# Patient Record
Sex: Female | Born: 1955 | Race: White | Hispanic: No | Marital: Married | Smoking: Former smoker
Health system: Southern US, Community
[De-identification: ages and names within clinical notes are randomized; demographics above are authoritative.]

## PROBLEM LIST (undated history)

## (undated) DIAGNOSIS — B019 Varicella without complication: Secondary | ICD-10-CM

## (undated) DIAGNOSIS — Z860100 Personal history of colon polyps, unspecified: Secondary | ICD-10-CM

## (undated) DIAGNOSIS — R7303 Prediabetes: Secondary | ICD-10-CM

## (undated) DIAGNOSIS — T7840XA Allergy, unspecified, initial encounter: Secondary | ICD-10-CM

## (undated) DIAGNOSIS — K52831 Collagenous colitis: Secondary | ICD-10-CM

## (undated) DIAGNOSIS — I10 Essential (primary) hypertension: Secondary | ICD-10-CM

## (undated) DIAGNOSIS — M5030 Other cervical disc degeneration, unspecified cervical region: Secondary | ICD-10-CM

## (undated) DIAGNOSIS — U071 COVID-19: Secondary | ICD-10-CM

## (undated) DIAGNOSIS — Z87898 Personal history of other specified conditions: Secondary | ICD-10-CM

## (undated) DIAGNOSIS — F329 Major depressive disorder, single episode, unspecified: Secondary | ICD-10-CM

## (undated) DIAGNOSIS — L900 Lichen sclerosus et atrophicus: Secondary | ICD-10-CM

## (undated) DIAGNOSIS — K760 Fatty (change of) liver, not elsewhere classified: Secondary | ICD-10-CM

## (undated) DIAGNOSIS — K635 Polyp of colon: Secondary | ICD-10-CM

## (undated) DIAGNOSIS — C4492 Squamous cell carcinoma of skin, unspecified: Secondary | ICD-10-CM

## (undated) DIAGNOSIS — E559 Vitamin D deficiency, unspecified: Secondary | ICD-10-CM

## (undated) DIAGNOSIS — F32A Depression, unspecified: Secondary | ICD-10-CM

## (undated) DIAGNOSIS — E785 Hyperlipidemia, unspecified: Secondary | ICD-10-CM

## (undated) DIAGNOSIS — F419 Anxiety disorder, unspecified: Secondary | ICD-10-CM

## (undated) DIAGNOSIS — M436 Torticollis: Secondary | ICD-10-CM

## (undated) DIAGNOSIS — D751 Secondary polycythemia: Secondary | ICD-10-CM

## (undated) DIAGNOSIS — M67432 Ganglion, left wrist: Secondary | ICD-10-CM

## (undated) DIAGNOSIS — R42 Dizziness and giddiness: Secondary | ICD-10-CM

## (undated) DIAGNOSIS — R32 Unspecified urinary incontinence: Secondary | ICD-10-CM

## (undated) DIAGNOSIS — K589 Irritable bowel syndrome without diarrhea: Secondary | ICD-10-CM

## (undated) DIAGNOSIS — E119 Type 2 diabetes mellitus without complications: Secondary | ICD-10-CM

## (undated) DIAGNOSIS — K649 Unspecified hemorrhoids: Secondary | ICD-10-CM

## (undated) HISTORY — DX: Other cervical disc degeneration, unspecified cervical region: M50.30

## (undated) HISTORY — DX: Essential (primary) hypertension: I10

## (undated) HISTORY — DX: Varicella without complication: B01.9

## (undated) HISTORY — DX: Fatty (change of) liver, not elsewhere classified: K76.0

## (undated) HISTORY — DX: Ganglion, left wrist: M67.432

## (undated) HISTORY — DX: Vitamin D deficiency, unspecified: E55.9

## (undated) HISTORY — DX: COVID-19: U07.1

## (undated) HISTORY — PX: HYSTEROSALPINGOGRAM: SHX6581

## (undated) HISTORY — DX: Prediabetes: R73.03

## (undated) HISTORY — DX: Polyp of colon: K63.5

## (undated) HISTORY — DX: Personal history of other specified conditions: Z87.898

## (undated) HISTORY — DX: Lichen sclerosus et atrophicus: L90.0

## (undated) HISTORY — DX: Collagenous colitis: K52.831

## (undated) HISTORY — DX: Unspecified urinary incontinence: R32

## (undated) HISTORY — PX: TONSILLECTOMY AND ADENOIDECTOMY: SHX28

## (undated) HISTORY — DX: Secondary polycythemia: D75.1

## (undated) HISTORY — DX: Major depressive disorder, single episode, unspecified: F32.9

## (undated) HISTORY — DX: Personal history of colonic polyps: Z86.010

## (undated) HISTORY — DX: Irritable bowel syndrome without diarrhea: K58.9

## (undated) HISTORY — DX: Allergy, unspecified, initial encounter: T78.40XA

## (undated) HISTORY — DX: Unspecified hemorrhoids: K64.9

## (undated) HISTORY — DX: Squamous cell carcinoma of skin, unspecified: C44.92

## (undated) HISTORY — DX: Anxiety disorder, unspecified: F41.9

## (undated) HISTORY — DX: Hyperlipidemia, unspecified: E78.5

## (undated) HISTORY — DX: Depression, unspecified: F32.A

## (undated) HISTORY — DX: Personal history of colon polyps, unspecified: Z86.0100

## (undated) HISTORY — PX: EYE SURGERY: SHX253

---

## 2004-10-16 HISTORY — PX: COLONOSCOPY: SHX174

## 2006-07-10 ENCOUNTER — Ambulatory Visit: Payer: Self-pay | Admitting: Gastroenterology

## 2009-06-09 ENCOUNTER — Ambulatory Visit: Payer: Self-pay | Admitting: Family Medicine

## 2010-05-31 ENCOUNTER — Ambulatory Visit: Payer: Self-pay | Admitting: Internal Medicine

## 2010-06-14 ENCOUNTER — Ambulatory Visit: Payer: Self-pay | Admitting: Internal Medicine

## 2011-05-30 ENCOUNTER — Ambulatory Visit: Payer: Self-pay | Admitting: Internal Medicine

## 2011-06-07 ENCOUNTER — Ambulatory Visit: Payer: Self-pay | Admitting: Internal Medicine

## 2011-06-17 ENCOUNTER — Ambulatory Visit: Payer: Self-pay | Admitting: Internal Medicine

## 2011-07-17 ENCOUNTER — Ambulatory Visit: Payer: Self-pay | Admitting: Internal Medicine

## 2011-08-17 ENCOUNTER — Ambulatory Visit: Payer: Self-pay | Admitting: Internal Medicine

## 2011-09-16 ENCOUNTER — Ambulatory Visit: Payer: Self-pay | Admitting: Internal Medicine

## 2011-10-17 ENCOUNTER — Ambulatory Visit: Payer: Self-pay | Admitting: Internal Medicine

## 2011-10-18 LAB — CANCER CENTER HEMATOCRIT: HCT: 46.4 % (ref 35.0–47.0)

## 2011-11-17 ENCOUNTER — Ambulatory Visit: Payer: Self-pay | Admitting: Internal Medicine

## 2012-01-24 ENCOUNTER — Ambulatory Visit: Payer: Self-pay | Admitting: Internal Medicine

## 2012-01-24 LAB — HEMATOCRIT: HCT: 46.3 %

## 2012-02-14 ENCOUNTER — Ambulatory Visit: Payer: Self-pay | Admitting: Internal Medicine

## 2012-03-26 ENCOUNTER — Ambulatory Visit: Payer: Self-pay | Admitting: Internal Medicine

## 2012-03-26 LAB — HEMATOCRIT: HCT: 47.3 % — ABNORMAL HIGH (ref 35.0–47.0)

## 2012-04-15 ENCOUNTER — Ambulatory Visit: Payer: Self-pay | Admitting: Internal Medicine

## 2012-06-11 ENCOUNTER — Ambulatory Visit: Payer: Self-pay | Admitting: Internal Medicine

## 2012-07-04 ENCOUNTER — Ambulatory Visit: Payer: Self-pay | Admitting: Internal Medicine

## 2012-07-16 ENCOUNTER — Ambulatory Visit: Payer: Self-pay | Admitting: Internal Medicine

## 2012-08-20 ENCOUNTER — Ambulatory Visit: Payer: Self-pay | Admitting: Internal Medicine

## 2012-08-20 LAB — CANCER CENTER HEMATOCRIT: HCT: 48.2 % — ABNORMAL HIGH (ref 35.0–47.0)

## 2012-09-15 ENCOUNTER — Ambulatory Visit: Payer: Self-pay | Admitting: Internal Medicine

## 2012-10-17 ENCOUNTER — Ambulatory Visit: Payer: Self-pay | Admitting: Internal Medicine

## 2012-10-17 LAB — CBC CANCER CENTER
Eosinophil #: 0.1 x10 3/mm (ref 0.0–0.7)
Eosinophil %: 0.8 %
HCT: 44.7 % (ref 35.0–47.0)
HGB: 15.1 g/dL (ref 12.0–16.0)
Lymphocyte #: 1.5 x10 3/mm (ref 1.0–3.6)
Lymphocyte %: 16 %
MCH: 31.8 pg (ref 26.0–34.0)
MCHC: 33.7 g/dL (ref 32.0–36.0)
Monocyte %: 5.9 %
Neutrophil #: 7 x10 3/mm — ABNORMAL HIGH (ref 1.4–6.5)
RDW: 13.5 % (ref 11.5–14.5)

## 2012-11-16 ENCOUNTER — Ambulatory Visit: Payer: Self-pay | Admitting: Internal Medicine

## 2012-12-17 ENCOUNTER — Ambulatory Visit: Payer: Self-pay | Admitting: Internal Medicine

## 2013-01-14 ENCOUNTER — Ambulatory Visit: Payer: Self-pay | Admitting: Internal Medicine

## 2013-02-13 ENCOUNTER — Ambulatory Visit: Payer: Self-pay | Admitting: Internal Medicine

## 2013-04-08 ENCOUNTER — Ambulatory Visit: Payer: Self-pay | Admitting: Internal Medicine

## 2013-04-15 ENCOUNTER — Ambulatory Visit: Payer: Self-pay | Admitting: Internal Medicine

## 2013-06-12 ENCOUNTER — Ambulatory Visit: Payer: Self-pay | Admitting: Internal Medicine

## 2013-07-29 ENCOUNTER — Ambulatory Visit: Payer: Self-pay | Admitting: Internal Medicine

## 2013-07-29 LAB — CANCER CENTER HEMATOCRIT: HCT: 49.9 % — ABNORMAL HIGH (ref 35.0–47.0)

## 2013-08-16 ENCOUNTER — Ambulatory Visit: Payer: Self-pay | Admitting: Internal Medicine

## 2013-09-25 ENCOUNTER — Ambulatory Visit: Payer: Self-pay | Admitting: Internal Medicine

## 2013-09-25 LAB — CBC CANCER CENTER
Basophil #: 0 x10 3/mm (ref 0.0–0.1)
Basophil %: 0.6 %
Eosinophil #: 0.1 x10 3/mm (ref 0.0–0.7)
Eosinophil %: 0.6 %
HCT: 48.7 % — ABNORMAL HIGH (ref 35.0–47.0)
Lymphocyte %: 13.7 %
MCHC: 33.6 g/dL (ref 32.0–36.0)
Monocyte #: 0.5 x10 3/mm (ref 0.2–0.9)
Platelet: 280 x10 3/mm (ref 150–440)
RDW: 13.5 % (ref 11.5–14.5)

## 2013-10-16 ENCOUNTER — Ambulatory Visit: Payer: Self-pay | Admitting: Internal Medicine

## 2013-10-21 LAB — CANCER CENTER HEMATOCRIT: HCT: 44.1 % (ref 35.0–47.0)

## 2013-11-04 LAB — CANCER CENTER HEMATOCRIT: HCT: 43.9 % (ref 35.0–47.0)

## 2013-11-16 ENCOUNTER — Ambulatory Visit: Payer: Self-pay | Admitting: Internal Medicine

## 2013-11-18 LAB — CANCER CENTER HEMATOCRIT: HCT: 45.8 % (ref 35.0–47.0)

## 2013-12-01 LAB — CANCER CENTER HEMATOCRIT: HCT: 41.6 % (ref 35.0–47.0)

## 2013-12-14 ENCOUNTER — Ambulatory Visit: Payer: Self-pay | Admitting: Internal Medicine

## 2013-12-30 LAB — HEMATOCRIT: HCT: 41.3 % (ref 35.0–47.0)

## 2014-01-14 ENCOUNTER — Ambulatory Visit: Payer: Self-pay | Admitting: Internal Medicine

## 2014-01-27 LAB — CANCER CENTER HEMATOCRIT: HCT: 43.5 % (ref 35.0–47.0)

## 2014-02-10 ENCOUNTER — Ambulatory Visit: Payer: Self-pay | Admitting: Internal Medicine

## 2014-02-10 LAB — CANCER CENTER HEMATOCRIT: HCT: 45.9 % (ref 35.0–47.0)

## 2014-02-13 ENCOUNTER — Ambulatory Visit: Payer: Self-pay | Admitting: Internal Medicine

## 2014-02-26 ENCOUNTER — Ambulatory Visit: Payer: Self-pay | Admitting: Internal Medicine

## 2014-02-26 LAB — CBC CANCER CENTER
BASOS ABS: 0.1 x10 3/mm (ref 0.0–0.1)
Basophil %: 1.2 %
EOS PCT: 1.3 %
Eosinophil #: 0.1 x10 3/mm (ref 0.0–0.7)
HCT: 41.9 % (ref 35.0–47.0)
HGB: 13.8 g/dL (ref 12.0–16.0)
LYMPHS PCT: 24.4 %
Lymphocyte #: 1.9 x10 3/mm (ref 1.0–3.6)
MCH: 31.3 pg (ref 26.0–34.0)
MCHC: 33 g/dL (ref 32.0–36.0)
MCV: 95 fL (ref 80–100)
MONOS PCT: 6.4 %
Monocyte #: 0.5 x10 3/mm (ref 0.2–0.9)
NEUTROS PCT: 66.7 %
Neutrophil #: 5.2 x10 3/mm (ref 1.4–6.5)
PLATELETS: 335 x10 3/mm (ref 150–440)
RBC: 4.42 10*6/uL (ref 3.80–5.20)
RDW: 14.3 % (ref 11.5–14.5)
WBC: 7.8 x10 3/mm (ref 3.6–11.0)

## 2014-03-16 ENCOUNTER — Ambulatory Visit: Payer: Self-pay | Admitting: Internal Medicine

## 2014-05-19 ENCOUNTER — Ambulatory Visit: Payer: Self-pay | Admitting: Internal Medicine

## 2014-05-19 LAB — HEMATOCRIT: HCT: 41.7 % (ref 35.0–47.0)

## 2014-06-16 ENCOUNTER — Ambulatory Visit: Payer: Self-pay | Admitting: Internal Medicine

## 2014-07-08 ENCOUNTER — Ambulatory Visit: Payer: Self-pay | Admitting: Internal Medicine

## 2014-08-11 ENCOUNTER — Ambulatory Visit: Payer: Self-pay | Admitting: Internal Medicine

## 2014-08-11 LAB — HEMATOCRIT: HCT: 42 % (ref 35.0–47.0)

## 2014-08-16 ENCOUNTER — Ambulatory Visit: Payer: Self-pay | Admitting: Internal Medicine

## 2014-11-03 ENCOUNTER — Ambulatory Visit: Payer: Self-pay | Admitting: Internal Medicine

## 2014-11-03 LAB — HEMATOCRIT: HCT: 42.2 % (ref 35.0–47.0)

## 2014-11-16 ENCOUNTER — Ambulatory Visit: Payer: Self-pay | Admitting: Internal Medicine

## 2015-01-12 ENCOUNTER — Ambulatory Visit: Payer: Self-pay | Admitting: Internal Medicine

## 2015-06-01 ENCOUNTER — Other Ambulatory Visit: Payer: Self-pay | Admitting: Internal Medicine

## 2015-06-01 DIAGNOSIS — Z1231 Encounter for screening mammogram for malignant neoplasm of breast: Secondary | ICD-10-CM

## 2015-06-08 ENCOUNTER — Ambulatory Visit
Admission: RE | Admit: 2015-06-08 | Discharge: 2015-06-08 | Disposition: A | Discharge status: Home / Self Care | Payer: BLUE CROSS/BLUE SHIELD | Source: Ambulatory Visit | Attending: Internal Medicine | Admitting: Internal Medicine

## 2015-06-08 DIAGNOSIS — Z1231 Encounter for screening mammogram for malignant neoplasm of breast: Secondary | ICD-10-CM

## 2015-07-06 DIAGNOSIS — D751 Secondary polycythemia: Secondary | ICD-10-CM

## 2015-07-06 DIAGNOSIS — E559 Vitamin D deficiency, unspecified: Secondary | ICD-10-CM

## 2015-07-06 DIAGNOSIS — K589 Irritable bowel syndrome without diarrhea: Secondary | ICD-10-CM

## 2015-07-06 DIAGNOSIS — E1169 Type 2 diabetes mellitus with other specified complication: Secondary | ICD-10-CM | POA: Insufficient documentation

## 2015-07-06 DIAGNOSIS — E785 Hyperlipidemia, unspecified: Secondary | ICD-10-CM

## 2015-07-06 HISTORY — DX: Hyperlipidemia, unspecified: E78.5

## 2015-07-06 HISTORY — DX: Irritable bowel syndrome, unspecified: K58.9

## 2015-07-06 HISTORY — DX: Vitamin D deficiency, unspecified: E55.9

## 2015-07-06 HISTORY — DX: Secondary polycythemia: D75.1

## 2015-07-08 ENCOUNTER — Ambulatory Visit: Payer: Self-pay | Admitting: Gastroenterology

## 2015-07-08 ENCOUNTER — Ambulatory Visit (INDEPENDENT_AMBULATORY_CARE_PROVIDER_SITE_OTHER): Payer: BLUE CROSS/BLUE SHIELD | Admitting: Gastroenterology

## 2015-07-08 ENCOUNTER — Encounter: Payer: Self-pay | Admitting: Gastroenterology

## 2015-07-08 ENCOUNTER — Encounter (INDEPENDENT_AMBULATORY_CARE_PROVIDER_SITE_OTHER): Payer: Self-pay

## 2015-07-08 DIAGNOSIS — K589 Irritable bowel syndrome without diarrhea: Secondary | ICD-10-CM | POA: Diagnosis not present

## 2015-07-08 NOTE — Progress Notes (Signed)
Gastroenterology Consultation  Referring Provider:     No ref. provider found Primary Care Physician:  Albina Billet, MD Primary Gastroenterologist:  Dr. Allen Norris     Reason for Consultation:     IBS with diarrhea        HPI:   Vanessa Herman is a 59 y.o. y/o female referred for consultation & management of IBS with diarrhea by Dr. Albina Billet, MD.  This patient comes in today with a report of IBS with diarrhea for many years. She states she has been dealing with this for some time. The patient reports that she has to wear diapers to bed because she has accidents. She is also afraid to go outside because she has so much diarrhea that she will soiled herself. No report of any unexplained weight loss. The patient also reports that she has had a colonoscopy since last seeing meeting years ago. He is no report of any unexplained weight loss. She does report that she thinks milk products and there products may make her symptoms somewhat worse.  Past Medical History  Diagnosis Date  . IBS (irritable bowel syndrome) 07/06/2015  . Hyperlipidemia 07/06/2015  . Polycythemia, secondary 07/06/2015  . Vitamin D deficiency 07/06/2015    Past Surgical History  Procedure Laterality Date  . Colonoscopy  2006    Prior to Admission medications   Medication Sig Start Date End Date Taking? Authorizing Provider  aspirin 81 MG tablet Take 81 mg by mouth daily.   Yes Historical Provider, MD  cholecalciferol (VITAMIN D) 1000 UNITS tablet Take 1,000 Units by mouth 2 (two) times daily.   Yes Historical Provider, MD  diazepam (VALIUM) 5 MG tablet Take 5 mg by mouth every 6 (six) hours as needed for anxiety.   Yes Historical Provider, MD  Multiple Vitamin (MULTIVITAMIN) tablet Take 1 tablet by mouth daily.   Yes Historical Provider, MD  simvastatin (ZOCOR) 20 MG tablet Take 20 mg by mouth daily.   Yes Historical Provider, MD    Family History  Problem Relation Age of Onset  . Breast cancer Maternal Grandmother 60   . Hypertension Father   . Hyperlipidemia Father      Social History  Substance Use Topics  . Smoking status: Former Research scientist (life sciences)  . Smokeless tobacco: Never Used  . Alcohol Use: 0.0 oz/week    0 Standard drinks or equivalent per week    Allergies as of 07/08/2015 - Review Complete 07/08/2015  Allergen Reaction Noted  . Lipitor [atorvastatin] Swelling 06/08/2015  . Levaquin [levofloxacin in d5w] Nausea And Vomiting 07/06/2015    Review of Systems:    All systems reviewed and negative except where noted in HPI.   Physical Exam:  There were no vitals taken for this visit. No LMP recorded. Patient is postmenopausal. Psych:  Alert and cooperative. Normal mood and affect. General:   Alert,  Well-developed, well-nourished, pleasant and cooperative in NAD Head:  Normocephalic and atraumatic. Eyes:  Sclera clear, no icterus.   Conjunctiva pink. Ears:  Normal auditory acuity. Nose:  No deformity, discharge, or lesions. Mouth:  No deformity or lesions,oropharynx pink & moist. Neck:  Supple; no masses or thyromegaly. Lungs:  Respirations even and unlabored.  Clear throughout to auscultation.   No wheezes, crackles, or rhonchi. No acute distress. Heart:  Regular rate and rhythm; no murmurs, clicks, rubs, or gallops. Abdomen:  Normal bowel sounds.  No bruits.  Soft, non-tender and non-distended without masses, hepatosplenomegaly or hernias noted.  No guarding or  rebound tenderness.  Negative Carnett sign.   Rectal:  Deferred.  Msk:  Symmetrical without gross deformities.  Good, equal movement & strength bilaterally. Pulses:  Normal pulses noted. Extremities:  No clubbing or edema.  No cyanosis. Neurologic:  Alert and oriented x3;  grossly normal neurologically. Skin:  Intact without significant lesions or rashes.  No jaundice. Lymph Nodes:  No significant cervical adenopathy. Psych:  Alert and cooperative. Normal mood and affect.  Imaging Studies: No results found.  Assessment and Plan:     Vanessa Herman is a 59 y.o. y/o female who comes in today with a history of irritable bowel syndrome with diarrhea predominance. The patient states that she had constipation as a child but then started having diarrhea for the last 20 years. She will now be started on Viberzi samples to see if this helps her symptoms if not we will consider Imodium. The patient will also be set up for colonoscopy since she has not had a colonoscopy in 10 years. The patient has been explained the plan and agrees with it I have discussed risks & benefits which include, but are not limited to, bleeding, infection, perforation & drug reaction.  The patient agrees with this plan & written consent will be obtained.     Note: This dictation was prepared with Dragon dictation along with smaller phrase technology. Any transcriptional errors that result from this process are unintentional.

## 2015-07-13 ENCOUNTER — Ambulatory Visit: Payer: Self-pay

## 2015-07-22 ENCOUNTER — Other Ambulatory Visit: Payer: Self-pay

## 2015-08-04 ENCOUNTER — Encounter: Payer: Self-pay | Admitting: *Deleted

## 2015-08-10 NOTE — Discharge Instructions (Signed)

## 2015-08-12 ENCOUNTER — Other Ambulatory Visit: Payer: Self-pay | Admitting: Gastroenterology

## 2015-08-12 ENCOUNTER — Ambulatory Visit: Payer: BLUE CROSS/BLUE SHIELD | Admitting: Student in an Organized Health Care Education/Training Program

## 2015-08-12 ENCOUNTER — Ambulatory Visit
Admission: RE | Admit: 2015-08-12 | Discharge: 2015-08-12 | Disposition: A | Discharge status: Home / Self Care | Payer: BLUE CROSS/BLUE SHIELD | Source: Ambulatory Visit | Attending: Gastroenterology | Admitting: Gastroenterology

## 2015-08-12 ENCOUNTER — Encounter
Admission: RE | Disposition: A | Discharge status: Home / Self Care | Payer: Self-pay | Source: Ambulatory Visit | Attending: Gastroenterology

## 2015-08-12 DIAGNOSIS — K589 Irritable bowel syndrome without diarrhea: Secondary | ICD-10-CM | POA: Diagnosis not present

## 2015-08-12 DIAGNOSIS — Z888 Allergy status to other drugs, medicaments and biological substances status: Secondary | ICD-10-CM | POA: Insufficient documentation

## 2015-08-12 DIAGNOSIS — D751 Secondary polycythemia: Secondary | ICD-10-CM | POA: Diagnosis not present

## 2015-08-12 DIAGNOSIS — Z7982 Long term (current) use of aspirin: Secondary | ICD-10-CM | POA: Diagnosis not present

## 2015-08-12 DIAGNOSIS — K641 Second degree hemorrhoids: Secondary | ICD-10-CM | POA: Diagnosis not present

## 2015-08-12 DIAGNOSIS — Z881 Allergy status to other antibiotic agents status: Secondary | ICD-10-CM | POA: Insufficient documentation

## 2015-08-12 DIAGNOSIS — E559 Vitamin D deficiency, unspecified: Secondary | ICD-10-CM | POA: Diagnosis not present

## 2015-08-12 DIAGNOSIS — Z87891 Personal history of nicotine dependence: Secondary | ICD-10-CM | POA: Diagnosis not present

## 2015-08-12 DIAGNOSIS — E785 Hyperlipidemia, unspecified: Secondary | ICD-10-CM | POA: Diagnosis not present

## 2015-08-12 DIAGNOSIS — Z1211 Encounter for screening for malignant neoplasm of colon: Secondary | ICD-10-CM | POA: Diagnosis not present

## 2015-08-12 DIAGNOSIS — K52831 Collagenous colitis: Secondary | ICD-10-CM | POA: Insufficient documentation

## 2015-08-12 HISTORY — PX: COLONOSCOPY WITH PROPOFOL: SHX5780

## 2015-08-12 HISTORY — DX: Torticollis: M43.6

## 2015-08-12 SURGERY — COLONOSCOPY WITH PROPOFOL
Anesthesia: Monitor Anesthesia Care | Wound class: Contaminated

## 2015-08-12 MED ORDER — STERILE WATER FOR IRRIGATION IR SOLN
Status: DC | PRN
  Administered 2015-08-12: 09:00:00

## 2015-08-12 MED ORDER — ACETAMINOPHEN 325 MG PO TABS: 325.0000 mg | ORAL_TABLET | ORAL | Status: DC | PRN

## 2015-08-12 MED ORDER — LACTATED RINGERS IV SOLN
INTRAVENOUS | Status: DC
  Administered 2015-08-12: 09:00:00 via INTRAVENOUS

## 2015-08-12 MED ORDER — LIDOCAINE HCL (CARDIAC) 20 MG/ML IV SOLN
INTRAVENOUS | Status: DC | PRN
  Administered 2015-08-12: 40 mg via INTRAVENOUS

## 2015-08-12 MED ORDER — ACETAMINOPHEN 160 MG/5ML PO SOLN: 325.0000 mg | ORAL | Status: DC | PRN

## 2015-08-12 MED ORDER — PROPOFOL 10 MG/ML IV BOLUS
INTRAVENOUS | Status: DC | PRN
  Administered 2015-08-12: 20 mg via INTRAVENOUS
  Administered 2015-08-12: 100 mg via INTRAVENOUS
  Administered 2015-08-12: 50 mg via INTRAVENOUS
  Administered 2015-08-12: 30 mg via INTRAVENOUS

## 2015-08-12 SURGICAL SUPPLY — 28 items
CANISTER SUCT 1200ML W/VALVE (MISCELLANEOUS) ×3 IMPLANT
FCP ESCP3.2XJMB 240X2.8X (MISCELLANEOUS)
FORCEPS BIOP RAD 4 LRG CAP 4 (CUTTING FORCEPS) ×3 IMPLANT
FORCEPS BIOP RJ4 240 W/NDL (MISCELLANEOUS)
FORCEPS ESCP3.2XJMB 240X2.8X (MISCELLANEOUS) IMPLANT
GOWN CVR UNV OPN BCK APRN NK (MISCELLANEOUS) ×2 IMPLANT
GOWN ISOL THUMB LOOP REG UNIV (MISCELLANEOUS) ×4
HEMOCLIP INSTINCT (CLIP) IMPLANT
INJECTOR VARIJECT VIN23 (MISCELLANEOUS) IMPLANT
KIT CO2 TUBING (TUBING) IMPLANT
KIT DEFENDO VALVE AND CONN (KITS) IMPLANT
KIT ENDO PROCEDURE OLY (KITS) ×3 IMPLANT
LIGATOR MULTIBAND 6SHOOTER MBL (MISCELLANEOUS) IMPLANT
MARKER SPOT ENDO TATTOO 5ML (MISCELLANEOUS) IMPLANT
PAD GROUND ADULT SPLIT (MISCELLANEOUS) IMPLANT
SNARE SHORT THROW 13M SML OVAL (MISCELLANEOUS) IMPLANT
SNARE SHORT THROW 30M LRG OVAL (MISCELLANEOUS) IMPLANT
SPOT EX ENDOSCOPIC TATTOO (MISCELLANEOUS)
SUCTION POLY TRAP 4CHAMBER (MISCELLANEOUS) IMPLANT
TRAP SUCTION POLY (MISCELLANEOUS) IMPLANT
TUBING CONN 6MMX3.1M (TUBING)
TUBING SUCTION CONN 0.25 STRL (TUBING) IMPLANT
UNDERPAD 30X60 958B10 (PK) (MISCELLANEOUS) IMPLANT
VALVE BIOPSY ENDO (VALVE) IMPLANT
VARIJECT INJECTOR VIN23 (MISCELLANEOUS)
WATER AUXILLARY (MISCELLANEOUS) IMPLANT
WATER STERILE IRR 250ML POUR (IV SOLUTION) ×3 IMPLANT
WATER STERILE IRR 500ML POUR (IV SOLUTION) IMPLANT

## 2015-08-12 NOTE — Transfer of Care (Signed)
Immediate Anesthesia Transfer of Care Note  Patient: Vanessa Herman  Procedure(s) Performed: Procedure(s) with comments: COLONOSCOPY WITH PROPOFOL (N/A) - PT WOULD LIKE EARLY AM PER JS  Patient Location: PACU  Anesthesia Type: MAC  Level of Consciousness: awake, alert  and patient cooperative  Airway and Oxygen Therapy: Patient Spontanous Breathing and Patient connected to supplemental oxygen  Post-op Assessment: Post-op Vital signs reviewed, Patient's Cardiovascular Status Stable, Respiratory Function Stable, Patent Airway and No signs of Nausea or vomiting  Post-op Vital Signs: Reviewed and stable  Complications: No apparent anesthesia complications

## 2015-08-12 NOTE — Anesthesia Postprocedure Evaluation (Signed)
  Anesthesia Post-op Note  Patient: Vanessa Herman  Procedure(s) Performed: Procedure(s) with comments: COLONOSCOPY WITH PROPOFOL (N/A) - PT WOULD LIKE EARLY AM PER JS  Anesthesia type:MAC  Patient location: PACU  Post pain: Pain level controlled  Post assessment: Post-op Vital signs reviewed, Patient's Cardiovascular Status Stable, Respiratory Function Stable, Patent Airway and No signs of Nausea or vomiting  Post vital signs: Reviewed and stable  Last Vitals:  Filed Vitals:   08/12/15 0930  BP: 130/96  Pulse: 99  Temp:   Resp: 13    Level of consciousness: awake, alert  and patient cooperative  Complications: No apparent anesthesia complications

## 2015-08-12 NOTE — Anesthesia Procedure Notes (Signed)
Procedure Name: MAC Performed by: Ela Moffat Pre-anesthesia Checklist: Patient identified, Emergency Drugs available, Suction available, Timeout performed and Patient being monitored Patient Re-evaluated:Patient Re-evaluated prior to inductionOxygen Delivery Method: Nasal cannula Placement Confirmation: positive ETCO2     

## 2015-08-12 NOTE — H&P (Signed)
  [facility]  [address], [place], [place] [address] Phone: [phone] Fax : [phone]  Primary Care Physician:  Albina Billet, MD Primary Gastroenterologist:  Dr. Allen Norris  Pre-Procedure History & Physical: HPI:  Vanessa Herman is a 59 y.o. female is here for a screening colonoscopy.   Past Medical History  Diagnosis Date  . IBS (irritable bowel syndrome) 07/06/2015  . Hyperlipidemia 07/06/2015  . Polycythemia, secondary 07/06/2015  . Vitamin D deficiency 07/06/2015  . NS (neck stiffness)     xray shows something at "C3" approx 4 yrs ago. Had PT. Helped.    Past Surgical History  Procedure Laterality Date  . Colonoscopy  2006    Prior to Admission medications   Medication Sig Start Date End Date Taking? Authorizing Provider  aspirin 81 MG tablet Take 81 mg by mouth daily.    Historical Provider, MD  cholecalciferol (VITAMIN D) 1000 UNITS tablet Take 1,000 Units by mouth 2 (two) times daily.    Historical Provider, MD  diazepam (VALIUM) 5 MG tablet Take 5 mg by mouth every 6 (six) hours as needed for anxiety.    Historical Provider, MD  Multiple Vitamin (MULTIVITAMIN) tablet Take 1 tablet by mouth daily.    Historical Provider, MD  simvastatin (ZOCOR) 20 MG tablet Take 20 mg by mouth daily.    Historical Provider, MD    Allergies as of 07/22/2015 - Review Complete 07/08/2015  Allergen Reaction Noted  . Lipitor [atorvastatin] Swelling 06/08/2015  . Levaquin [levofloxacin in d5w] Nausea And Vomiting 07/06/2015    Family History  Problem Relation Age of Onset  . Breast cancer Maternal Grandmother 60  . Hypertension Father   . Hyperlipidemia Father     Social History   Social History  . Marital Status: Married    Spouse Name: N/A  . Number of Children: N/A  . Years of Education: N/A   Occupational History  . Not on file.   Social History Main Topics  . Smoking status: Former Smoker    Quit date: 04/03/2014  . Smokeless tobacco: Never Used    . Alcohol Use: 8.4 oz/week    0 Standard drinks or equivalent, 14 Glasses of wine per week  . Drug Use: No  . Sexual Activity: Not on file   Other Topics Concern  . Not on file   Social History Narrative    Review of Systems: See HPI, otherwise negative ROS  Physical Exam: BP 117/77 mmHg  Pulse 90  Temp(Src) 97.9 F (36.6 C) (Temporal)  Resp 16  Ht 4\' 10"  (1.473 m)  Wt 127 lb (57.607 kg)  BMI 26.55 kg/m2  SpO2 100% General:   Alert,  pleasant and cooperative in NAD Head:  Normocephalic and atraumatic. Neck:  Supple; no masses or thyromegaly. Lungs:  Clear throughout to auscultation.    Heart:  Regular rate and rhythm. Abdomen:  Soft, nontender and nondistended. Normal bowel sounds, without guarding, and without rebound.   Neurologic:  Alert and  oriented x4;  grossly normal neurologically.  Impression/Plan: Vanessa Herman is now here to undergo a screening colonoscopy.  Risks, benefits, and alternatives regarding colonoscopy have been reviewed with the patient.  Questions have been answered.  All parties agreeable.

## 2015-08-12 NOTE — Op Note (Signed)
[facility] Patient Name: Vanessa Herman Procedure Date: 08/12/2015 8:56 AM MRN: [id number] Account #: [id number] Date of Birth: [birth year] Admit Type: Outpatient Age: 59 Room: [facility] OR ROOM 01 Gender: Female Note Status: Finalized Procedure:         Colonoscopy Indications:       Screening for colorectal malignant neoplasm Providers:         [name], MD Referring MD:      Leona Carry. Hall Busing, MD (Referring MD) Medicines:         Propofol per Anesthesia Complications:     No immediate complications. Procedure:         Pre-Anesthesia Assessment:                    - Prior to the procedure, a History and Physical was                     performed, and patient medications and allergies were                     reviewed. The patient's tolerance of previous anesthesia                     was also reviewed. The risks and benefits of the procedure                     and the sedation options and risks were discussed with the                     patient. All questions were answered, and informed consent                     was obtained. Prior Anticoagulants: The patient has taken                     no previous anticoagulant or antiplatelet agents. ASA                     Grade Assessment: II - A patient with mild systemic                     disease. After reviewing the risks and benefits, the                     patient was deemed in satisfactory condition to undergo                     the procedure.                    After obtaining informed consent, the colonoscope was                     passed under direct vision. Throughout the procedure, the                     patient's blood pressure, pulse, and oxygen saturations                     were monitored continuously. The was introduced through                     the anus and advanced to the the terminal ileum. The  colonoscopy was performed without difficulty. The patient              tolerated the procedure well. The quality of the bowel                     preparation was excellent. Findings:      The perianal and digital rectal examinations were normal.      The terminal ileum appeared normal. Biopsies were taken with a cold       forceps for histology.      Non-bleeding internal hemorrhoids were found during retroflexion. The       hemorrhoids were Grade II (internal hemorrhoids that prolapse but reduce       spontaneously). Impression:        - The examined portion of the ileum was normal. Biopsied.                    - Non-bleeding internal hemorrhoids. Recommendation:    - Await pathology results. Procedure Code(s): --- Professional ---                    [id number], Colonoscopy, flexible; with biopsy, single or                     multiple Diagnosis Code(s): --- Professional ---                    Z12.11, Encounter for screening for malignant neoplasm of                     colon CPT copyright 2014 American Medical Association. All rights reserved. The codes documented in this report are preliminary and upon coder review may  be revised to meet current compliance requirements. [name], MD 08/12/2015 9:21:04 AM This report has been signed electronically. Number of Addenda: 0 Note Initiated On: 08/12/2015 8:56 AM Scope Withdrawal Time: 0 hours 6 minutes 7 seconds  Total Procedure Duration: 0 hours 8 minutes 30 seconds       [facility]

## 2015-08-12 NOTE — Anesthesia Preprocedure Evaluation (Signed)
Anesthesia Evaluation  Patient identified by MRN, date of birth, ID band  Reviewed: Allergy & Precautions, H&P , NPO status , Patient's Chart, lab work & pertinent test results  Airway Mallampati: I  TM Distance: >3 FB Neck ROM: full    Dental no notable dental hx.    Pulmonary former smoker,    Pulmonary exam normal        Cardiovascular  Rhythm:regular Rate:Normal     Neuro/Psych    GI/Hepatic   Endo/Other    Renal/GU      Musculoskeletal   Abdominal   Peds  Hematology   Anesthesia Other Findings   Reproductive/Obstetrics                             Anesthesia Physical Anesthesia Plan  ASA: II  Anesthesia Plan: MAC   Post-op Pain Management:    Induction:   Airway Management Planned:   Additional Equipment:   Intra-op Plan:   Post-operative Plan:   Informed Consent: I have reviewed the patients History and Physical, chart, labs and discussed the procedure including the risks, benefits and alternatives for the proposed anesthesia with the patient or authorized representative who has indicated his/her understanding and acceptance.     Plan Discussed with: CRNA  Anesthesia Plan Comments:         Anesthesia Quick Evaluation

## 2015-08-13 ENCOUNTER — Encounter: Payer: Self-pay | Admitting: Gastroenterology

## 2015-08-17 ENCOUNTER — Telehealth: Payer: Self-pay

## 2015-08-17 NOTE — Telephone Encounter (Signed)
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

## 2015-08-17 NOTE — Telephone Encounter (Signed)
Pt notified of results and scheduled appt to discuss results.

## 2015-08-18 ENCOUNTER — Encounter: Payer: Self-pay | Admitting: Gastroenterology

## 2015-08-18 ENCOUNTER — Ambulatory Visit (INDEPENDENT_AMBULATORY_CARE_PROVIDER_SITE_OTHER): Payer: BLUE CROSS/BLUE SHIELD | Admitting: Gastroenterology

## 2015-08-18 VITALS — BP 135/80 | HR 76 | Temp 98.5°F | Ht <= 58 in | Wt 133.0 lb

## 2015-08-18 DIAGNOSIS — K52831 Collagenous colitis: Secondary | ICD-10-CM | POA: Diagnosis not present

## 2015-08-18 MED ORDER — BUDESONIDE 3 MG PO CPEP: 9.0000 mg | ORAL_CAPSULE | ORAL | Status: DC

## 2015-08-18 NOTE — Progress Notes (Signed)
   Primary Care Physician: Albina Billet, MD  Primary Gastroenterologist:  Dr. [name]  Chief Complaint  Patient presents with  . Follow up colonoscopy results    HPI: Vanessa Herman is a 59 y.o. female here For follow-up after having a colonoscopy. The patient reports that she has been having diarrhea for many years and has been told that she has irritable bowel syndrome. 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  Current Outpatient Prescriptions  Medication Sig Dispense Refill  . aspirin 81 MG tablet Take 81 mg by mouth daily.    . cholecalciferol (VITAMIN D) 1000 UNITS tablet Take 1,000 Units by mouth 2 (two) times daily.    . diazepam (VALIUM) 5 MG tablet Take 5 mg by mouth every 6 (six) hours as needed for anxiety.    . Multiple Vitamin (MULTIVITAMIN) tablet Take 1 tablet by mouth daily.    . simvastatin (ZOCOR) 20 MG tablet Take 20 mg by mouth daily.    . budesonide (ENTOCORT EC) 3 MG 24 hr capsule Take 3 capsules (9 mg total) by mouth every morning. 90 capsule 1   No current facility-administered medications for this visit.    Allergies as of 08/18/2015 - Review Complete 08/18/2015  Allergen Reaction Noted  . Lipitor [atorvastatin] Swelling 06/08/2015  . Levaquin [levofloxacin in d5w] Nausea And Vomiting 07/06/2015    ROS:  General: Negative for anorexia, weight loss, fever, chills, fatigue, weakness. ENT: Negative for hoarseness, difficulty swallowing , nasal congestion. CV: Negative for chest pain, angina, palpitations, dyspnea on exertion, peripheral edema.  Respiratory: Negative for dyspnea at rest, dyspnea on exertion, cough, sputum, wheezing.  GI: See history of present illness. GU:  Negative for dysuria, hematuria, urinary incontinence, urinary frequency, nocturnal urination.  Endo: Negative for unusual weight change.    Physical Examination:   BP 135/80 mmHg  Pulse 76  Temp(Src) 98.5 F (36.9 C) (Oral)  Ht 4\' 10"  (1.473  m)  Wt 133 lb (60.328 kg)  BMI 27.80 kg/m2  General: Well-nourished, well-developed in no acute distress.  Eyes: No icterus. Conjunctivae pink. Mouth: Oropharyngeal mucosa moist and pink , no lesions erythema or exudate. Lungs: Clear to auscultation bilaterally. Non-labored. Heart: Regular rate and rhythm, no murmurs rubs or gallops.  Abdomen: Bowel sounds are normal, nontender, nondistended, no hepatosplenomegaly or masses, no abdominal bruits or hernia , no rebound or guarding.   Extremities: No lower extremity edema. No clubbing or deformities. Neuro: Alert and oriented x 3.  Grossly intact. Skin: Warm and dry, no jaundice.   Psych: Alert and cooperative, normal mood and affect.  Labs:    Imaging Studies: No results found.  Assessment and Plan:   Vanessa Herman is a 59 y.o. y/o female  Who comes in today with a recent colonoscopy showing collagenous colitis. The patient has been told she has IBS-D for many years. The diagnosis was made by random colon biopsies during her recent exam. The patient will be started on budesonide 9 mg a day for eight weeks. The patient has been explained the plan and agrees with it.   Note: This dictation was prepared with Dragon dictation along with smaller phrase technology. Any transcriptional errors that result from this process are unintentional.

## 2015-10-27 ENCOUNTER — Telehealth: Payer: Self-pay | Admitting: Gastroenterology

## 2015-10-27 NOTE — Telephone Encounter (Signed)
Pt is aware rx was sent to pharmacy. Recommended pt to start a probiotic and gasx while taking the Budesonide. If she continues having the gas and burping even with the medication to please call me back.

## 2015-10-27 NOTE — Telephone Encounter (Signed)
Patient called and said she is having diarrhea again. Per patient she has colitis and finished her medication Budesonide about a week ago. Per Dr Allen Norris we will refill her medication with 3 refills. I have called in medication for patient at [facility] in [place] per patients request. and phoned patient to let her know. And patient also mentioned having gas and burping a lot since medication and patient wanted to know if Dr.Wohl had any suggestions.

## 2015-12-03 ENCOUNTER — Other Ambulatory Visit: Payer: Self-pay | Admitting: Internal Medicine

## 2015-12-03 DIAGNOSIS — E559 Vitamin D deficiency, unspecified: Secondary | ICD-10-CM

## 2015-12-03 DIAGNOSIS — E2839 Other primary ovarian failure: Secondary | ICD-10-CM

## 2015-12-21 ENCOUNTER — Ambulatory Visit
Admission: RE | Admit: 2015-12-21 | Discharge: 2015-12-21 | Disposition: A | Discharge status: Home / Self Care | Payer: BLUE CROSS/BLUE SHIELD | Source: Ambulatory Visit | Attending: Internal Medicine | Admitting: Internal Medicine

## 2015-12-21 DIAGNOSIS — E559 Vitamin D deficiency, unspecified: Secondary | ICD-10-CM | POA: Insufficient documentation

## 2015-12-21 DIAGNOSIS — E2839 Other primary ovarian failure: Secondary | ICD-10-CM | POA: Diagnosis present

## 2015-12-21 DIAGNOSIS — M810 Age-related osteoporosis without current pathological fracture: Secondary | ICD-10-CM | POA: Diagnosis not present

## 2015-12-21 DIAGNOSIS — M8588 Other specified disorders of bone density and structure, other site: Secondary | ICD-10-CM | POA: Diagnosis not present

## 2015-12-27 ENCOUNTER — Other Ambulatory Visit: Payer: Self-pay | Admitting: Internal Medicine

## 2015-12-27 DIAGNOSIS — R1084 Generalized abdominal pain: Secondary | ICD-10-CM

## 2015-12-28 ENCOUNTER — Telehealth: Payer: Self-pay | Admitting: Gastroenterology

## 2015-12-28 NOTE — Telephone Encounter (Signed)
Please advise 

## 2015-12-28 NOTE — Telephone Encounter (Signed)
Has questions about taking Budesonide. She has 10 days left. Also Dr. Hall Busing wants to put her on Boniva 150 mg. She doesn't really want to go on it because what she has read about it. What does Dr. Allen Norris think about it? Can she take it with the Budesonide?

## 2016-01-04 NOTE — Telephone Encounter (Signed)
Tell the patient that the budesonide will not interact with the Boniva. As for my opinion on her taking the Boniva, this is not my area of expertise and I will leave it up to her primary care provider to treat her osteoporosis.

## 2016-01-05 NOTE — Telephone Encounter (Signed)
Pt advised of Dr. Dorothey Baseman recommendation.

## 2016-01-05 NOTE — Telephone Encounter (Signed)
Tried returning pt's call. No vm to leave message.

## 2016-01-11 ENCOUNTER — Ambulatory Visit: Payer: BLUE CROSS/BLUE SHIELD

## 2016-01-11 ENCOUNTER — Ambulatory Visit
Admission: RE | Admit: 2016-01-11 | Discharge: 2016-01-11 | Disposition: A | Discharge status: Home / Self Care | Payer: BLUE CROSS/BLUE SHIELD | Source: Ambulatory Visit | Attending: Internal Medicine | Admitting: Internal Medicine

## 2016-01-11 DIAGNOSIS — R109 Unspecified abdominal pain: Secondary | ICD-10-CM | POA: Diagnosis not present

## 2016-01-11 DIAGNOSIS — R1084 Generalized abdominal pain: Secondary | ICD-10-CM

## 2016-02-21 ENCOUNTER — Other Ambulatory Visit: Payer: Self-pay

## 2016-02-21 DIAGNOSIS — K529 Noninfective gastroenteritis and colitis, unspecified: Secondary | ICD-10-CM

## 2016-02-21 MED ORDER — BUDESONIDE 3 MG PO CPEP: 9.0000 mg | ORAL_CAPSULE | ORAL | Status: DC

## 2016-02-22 ENCOUNTER — Other Ambulatory Visit: Payer: Self-pay

## 2016-02-22 DIAGNOSIS — K529 Noninfective gastroenteritis and colitis, unspecified: Secondary | ICD-10-CM

## 2016-02-22 MED ORDER — BUDESONIDE 3 MG PO CPEP: 9.0000 mg | ORAL_CAPSULE | ORAL | Status: DC

## 2016-05-02 ENCOUNTER — Other Ambulatory Visit: Payer: Self-pay

## 2016-05-02 DIAGNOSIS — K529 Noninfective gastroenteritis and colitis, unspecified: Secondary | ICD-10-CM

## 2016-05-02 MED ORDER — BUDESONIDE 3 MG PO CPEP: 9.0000 mg | ORAL_CAPSULE | ORAL | Status: DC

## 2016-05-19 ENCOUNTER — Other Ambulatory Visit: Payer: Self-pay | Admitting: Internal Medicine

## 2016-05-19 DIAGNOSIS — Z1231 Encounter for screening mammogram for malignant neoplasm of breast: Secondary | ICD-10-CM

## 2016-05-22 LAB — HM PAP SMEAR: HM PAP: NEGATIVE

## 2016-06-01 ENCOUNTER — Other Ambulatory Visit: Payer: Self-pay | Admitting: Gastroenterology

## 2016-06-15 ENCOUNTER — Ambulatory Visit
Admission: RE | Admit: 2016-06-15 | Discharge: 2016-06-15 | Disposition: A | Discharge status: Home / Self Care | Payer: BLUE CROSS/BLUE SHIELD | Source: Ambulatory Visit | Attending: Internal Medicine | Admitting: Internal Medicine

## 2016-06-15 DIAGNOSIS — Z1231 Encounter for screening mammogram for malignant neoplasm of breast: Secondary | ICD-10-CM | POA: Diagnosis not present

## 2016-09-04 ENCOUNTER — Telehealth: Payer: Self-pay | Admitting: Gastroenterology

## 2016-09-04 NOTE — Telephone Encounter (Signed)
Precautions for C-diff given to pt. Went over symptoms with her in case she develops this. Pt verbalized understanding of these and will contact me if anything happens.

## 2016-09-04 NOTE — Telephone Encounter (Signed)
Patients husband came home from the hospital with C diff and she is concerned she might get it and has some questions. Please call

## 2016-10-25 ENCOUNTER — Ambulatory Visit (INDEPENDENT_AMBULATORY_CARE_PROVIDER_SITE_OTHER): Payer: BLUE CROSS/BLUE SHIELD | Admitting: Gastroenterology

## 2016-10-25 ENCOUNTER — Encounter: Payer: Self-pay | Admitting: Gastroenterology

## 2016-10-25 VITALS — BP 122/63 | HR 65 | Temp 98.8°F | Ht <= 58 in | Wt 133.0 lb

## 2016-10-25 DIAGNOSIS — K52831 Collagenous colitis: Secondary | ICD-10-CM

## 2016-10-25 NOTE — Progress Notes (Signed)
Primary Care Physician: Albina Billet, MD  Primary Gastroenterologist:  Dr. [name]  Chief Complaint  Patient presents with  . Follow up Colitis    HPI: Vanessa Herman is a 61 y.o. female here for follow-up of her collagenous colitis. The patient states she has been doing well as long she takes the medication. The patient also reports that at times she has constipation although she feels constipated she still has soft stools. There is no report of any rectal bleeding unexplained weight loss or abdominal pain.  Current Outpatient Prescriptions  Medication Sig Dispense Refill  . aspirin 81 MG tablet Take 81 mg by mouth daily.    . budesonide (ENTOCORT EC) 3 MG 24 hr capsule Take 3 capsules (9 mg total) by mouth every morning. 90 capsule 6  . budesonide (ENTOCORT EC) 3 MG 24 hr capsule TAKE 3 CAPSULES BY MOUTH EVERY MORNING 90 capsule 3  . cholecalciferol (VITAMIN D) 1000 UNITS tablet Take 1,000 Units by mouth 2 (two) times daily.    . diazepam (VALIUM) 5 MG tablet Take 5 mg by mouth every 6 (six) hours as needed for anxiety.    . metoprolol tartrate (LOPRESSOR) 25 MG tablet Take 25 mg by mouth daily.    . simvastatin (ZOCOR) 20 MG tablet Take 20 mg by mouth daily.    Marland Kitchen lisinopril (PRINIVIL,ZESTRIL) 10 MG tablet TK 1 T PO BID  3  . Multiple Vitamin (MULTIVITAMIN) tablet Take 1 tablet by mouth daily.     No current facility-administered medications for this visit.     Allergies as of 10/25/2016 - Review Complete 08/18/2015  Allergen Reaction Noted  . Lipitor [atorvastatin] Swelling 06/08/2015  . Levaquin [levofloxacin in d5w] Nausea And Vomiting 07/06/2015    ROS:  General: Negative for anorexia, weight loss, fever, chills, fatigue, weakness. ENT: Negative for hoarseness, difficulty swallowing , nasal congestion. CV: Negative for chest pain, angina, palpitations, dyspnea on exertion, peripheral edema.  Respiratory: Negative for dyspnea at rest, dyspnea on exertion, cough,  sputum, wheezing.  GI: See history of present illness. GU:  Negative for dysuria, hematuria, urinary incontinence, urinary frequency, nocturnal urination.  Endo: Negative for unusual weight change.    Physical Examination:   BP 122/63   Pulse 65   Temp 98.8 F (37.1 C) (Oral)   Ht 4\' 10"  (1.473 m)   Wt 133 lb (60.3 kg)   BMI 27.80 kg/m   General: Well-nourished, well-developed in no acute distress.  Eyes: No icterus. Conjunctivae pink. Mouth: Oropharyngeal mucosa moist and pink , no lesions erythema or exudate. Lungs: Clear to auscultation bilaterally. Non-labored. Heart: Regular rate and rhythm, no murmurs rubs or gallops.  Abdomen: Bowel sounds are normal, nontender, nondistended, no hepatosplenomegaly or masses, no abdominal bruits or hernia , no rebound or guarding.   Extremities: No lower extremity edema. No clubbing or deformities. Neuro: Alert and oriented x 3.  Grossly intact. Skin: Warm and dry, no jaundice.   Psych: Alert and cooperative, normal mood and affect.  Labs:    Imaging Studies: No results found.  Assessment and Plan:   Vanessa Herman is a 61 y.o. y/o female who comes in today with a history of collagenous colitis. The patient will have her budesonide refilled. The patient has been told to take Imodium intermittently if her diarrhea starts to bother her. She has also been told to follow-up with me as needed and should have a repeat colonoscopy in 10 years. The patient has been explained the  plan and agrees with it.    [name], MD. Marval Regal   Note: This dictation was prepared with Dragon dictation along with smaller phrase technology. Any transcriptional errors that result from this process are unintentional.

## 2017-01-11 ENCOUNTER — Telehealth: Payer: Self-pay | Admitting: Gastroenterology

## 2017-01-11 ENCOUNTER — Other Ambulatory Visit: Payer: Self-pay

## 2017-01-11 MED ORDER — BUDESONIDE 3 MG PO CPEP: 9.0000 mg | ORAL_CAPSULE | ORAL | 3 refills | Status: DC

## 2017-01-11 NOTE — Telephone Encounter (Signed)
Rx for 90 day supply has been sent to [facility] per pt request. Done for 1 year.

## 2017-01-11 NOTE — Telephone Encounter (Signed)
Call [facility] in [place] Budesonide  Its ok to fill this rx 3 months at a time and it's needs to be a 1 yr rx

## 2017-02-23 ENCOUNTER — Telehealth: Payer: Self-pay | Admitting: Gastroenterology

## 2017-02-23 NOTE — Telephone Encounter (Signed)
Patient LVM and stated that she passed a blood clot and now her rectum is sore and is this normal?

## 2017-03-01 NOTE — Telephone Encounter (Signed)
Pt stated that she feels better issue resolved thinks it was a hemorrhoid. I apologized for the delay in reaching out to check on her.  She appreciated the check in still.

## 2017-05-18 ENCOUNTER — Other Ambulatory Visit: Payer: Self-pay | Admitting: Internal Medicine

## 2017-05-18 DIAGNOSIS — Z1231 Encounter for screening mammogram for malignant neoplasm of breast: Secondary | ICD-10-CM

## 2017-06-12 ENCOUNTER — Inpatient Hospital Stay: Admission: RE | Admit: 2017-06-12 | Payer: BLUE CROSS/BLUE SHIELD | Source: Ambulatory Visit

## 2017-06-20 ENCOUNTER — Ambulatory Visit
Admission: RE | Admit: 2017-06-20 | Discharge: 2017-06-20 | Disposition: A | Discharge status: Home / Self Care | Payer: BLUE CROSS/BLUE SHIELD | Source: Ambulatory Visit | Attending: Internal Medicine | Admitting: Internal Medicine

## 2017-06-20 DIAGNOSIS — Z1231 Encounter for screening mammogram for malignant neoplasm of breast: Secondary | ICD-10-CM | POA: Diagnosis present

## 2017-11-27 ENCOUNTER — Encounter (INDEPENDENT_AMBULATORY_CARE_PROVIDER_SITE_OTHER): Payer: Self-pay

## 2017-11-27 ENCOUNTER — Encounter: Payer: Self-pay | Admitting: Gastroenterology

## 2017-11-27 ENCOUNTER — Ambulatory Visit: Payer: BLUE CROSS/BLUE SHIELD | Admitting: Gastroenterology

## 2017-11-27 VITALS — BP 158/88 | HR 78 | Ht <= 58 in | Wt 142.2 lb

## 2017-11-27 DIAGNOSIS — K52831 Collagenous colitis: Secondary | ICD-10-CM | POA: Insufficient documentation

## 2017-11-27 DIAGNOSIS — K52839 Microscopic colitis, unspecified: Secondary | ICD-10-CM

## 2017-11-27 NOTE — Progress Notes (Signed)
Primary Care Physician: Albina Billet, MD  Primary Gastroenterologist:  Dr. [name]  Chief Complaint  Patient presents with  . Medication Refill    HPI: Vanessa Herman is a 62 y.o. 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  The patient has been treated with budesonide and states that she has been doing very well on the budesonide.  The patient also reports that she will sometimes get constipation.  The patient is here for a refill of her medications and reports that the medication has been life-changing for her. There is no report of any black stools or bloody stools.  She also denies any unexplained weight loss.  Current Outpatient Medications  Medication Sig Dispense Refill  . aspirin 81 MG tablet Take 81 mg by mouth daily.    . budesonide (ENTOCORT EC) 3 MG 24 hr capsule TAKE 3 CAPSULES BY MOUTH EVERY MORNING 90 capsule 3  . budesonide (ENTOCORT EC) 3 MG 24 hr capsule Take 3 capsules (9 mg total) by mouth every morning. 270 capsule 3  . cholecalciferol (VITAMIN D) 1000 UNITS tablet Take 1,000 Units by mouth 2 (two) times daily.    . diazepam (VALIUM) 5 MG tablet Take 5 mg by mouth every 6 (six) hours as needed for anxiety.    Marland Kitchen lisinopril (PRINIVIL,ZESTRIL) 10 MG tablet TK 1 T PO BID  3  . metoprolol tartrate (LOPRESSOR) 25 MG tablet Take 25 mg by mouth daily.    . Multiple Vitamin (MULTIVITAMIN) tablet Take 1 tablet by mouth daily.    . simvastatin (ZOCOR) 20 MG tablet Take 20 mg by mouth daily.     No current facility-administered medications for this visit.     Allergies as of 11/27/2017 - Review Complete 11/27/2017  Allergen Reaction Noted  . Lipitor [atorvastatin] Swelling 06/08/2015  . Levaquin [levofloxacin in d5w] Nausea And Vomiting 07/06/2015    ROS:  General: Negative for anorexia, weight loss, fever, chills, fatigue, weakness. ENT: Negative for hoarseness, difficulty swallowing , nasal congestion. CV: Negative for chest pain, angina, palpitations,  dyspnea on exertion, peripheral edema.  Respiratory: Negative for dyspnea at rest, dyspnea on exertion, cough, sputum, wheezing.  GI: See history of present illness. GU:  Negative for dysuria, hematuria, urinary incontinence, urinary frequency, nocturnal urination.  Endo: Negative for unusual weight change.    Physical Examination:   BP (!) 158/88   Pulse 78   Ht 4\' 10"  (1.473 m)   Wt 142 lb 3.2 oz (64.5 kg)   BMI 29.72 kg/m   General: Well-nourished, well-developed in no acute distress.  Eyes: No icterus. Conjunctivae pink. Mouth: Oropharyngeal mucosa moist and pink , no lesions erythema or exudate. Lungs: Clear to auscultation bilaterally. Non-labored. Heart: Regular rate and rhythm, no murmurs rubs or gallops.  Abdomen: Bowel sounds are normal, nontender, nondistended, no hepatosplenomegaly or masses, no abdominal bruits or hernia , no rebound or guarding.   Extremities: No lower extremity edema. No clubbing or deformities. Neuro: Alert and oriented x 3.  Grossly intact. Skin: Warm and dry, no jaundice.   Psych: Alert and cooperative, normal mood and affect.  Labs:    Imaging Studies: No results found.  Assessment and Plan:   Vanessa Herman is a 62 y.o. y/o female with a history of microscopic colitis to is now on budesonide for the microscopic colitis.  The patient has been doing well and needs a refill of this medication.  The patient will be sent in a refill of  her prescription.  The patient has been explained the plan and agrees with it.    [name], MD. Marval Regal   Note: This dictation was prepared with Dragon dictation along with smaller phrase technology. Any transcriptional errors that result from this process are unintentional.

## 2017-11-28 ENCOUNTER — Other Ambulatory Visit: Payer: Self-pay

## 2017-11-28 MED ORDER — BUDESONIDE 3 MG PO CPEP: 9.0000 mg | ORAL_CAPSULE | ORAL | 3 refills | Status: DC

## 2018-03-12 ENCOUNTER — Other Ambulatory Visit: Payer: Self-pay | Admitting: Internal Medicine

## 2018-03-12 DIAGNOSIS — M810 Age-related osteoporosis without current pathological fracture: Secondary | ICD-10-CM

## 2018-04-11 LAB — HM DIABETES EYE EXAM

## 2018-05-07 ENCOUNTER — Ambulatory Visit: Payer: BLUE CROSS/BLUE SHIELD | Admitting: Internal Medicine

## 2018-05-07 ENCOUNTER — Encounter (INDEPENDENT_AMBULATORY_CARE_PROVIDER_SITE_OTHER): Payer: Self-pay

## 2018-05-07 VITALS — BP 162/98 | HR 79 | Temp 98.8°F | Ht <= 58 in | Wt 143.4 lb

## 2018-05-07 DIAGNOSIS — E785 Hyperlipidemia, unspecified: Secondary | ICD-10-CM

## 2018-05-07 DIAGNOSIS — R61 Generalized hyperhidrosis: Secondary | ICD-10-CM

## 2018-05-07 DIAGNOSIS — Z1159 Encounter for screening for other viral diseases: Secondary | ICD-10-CM

## 2018-05-07 DIAGNOSIS — M8580 Other specified disorders of bone density and structure, unspecified site: Secondary | ICD-10-CM

## 2018-05-07 DIAGNOSIS — Z1231 Encounter for screening mammogram for malignant neoplasm of breast: Secondary | ICD-10-CM | POA: Diagnosis not present

## 2018-05-07 DIAGNOSIS — K52831 Collagenous colitis: Secondary | ICD-10-CM

## 2018-05-07 DIAGNOSIS — E119 Type 2 diabetes mellitus without complications: Secondary | ICD-10-CM | POA: Insufficient documentation

## 2018-05-07 DIAGNOSIS — Z0184 Encounter for antibody response examination: Secondary | ICD-10-CM

## 2018-05-07 DIAGNOSIS — Z860100 Personal history of colon polyps, unspecified: Secondary | ICD-10-CM

## 2018-05-07 DIAGNOSIS — I10 Essential (primary) hypertension: Secondary | ICD-10-CM | POA: Diagnosis not present

## 2018-05-07 DIAGNOSIS — Z1283 Encounter for screening for malignant neoplasm of skin: Secondary | ICD-10-CM

## 2018-05-07 DIAGNOSIS — M810 Age-related osteoporosis without current pathological fracture: Secondary | ICD-10-CM | POA: Diagnosis not present

## 2018-05-07 DIAGNOSIS — M25562 Pain in left knee: Secondary | ICD-10-CM

## 2018-05-07 DIAGNOSIS — R300 Dysuria: Secondary | ICD-10-CM

## 2018-05-07 DIAGNOSIS — E559 Vitamin D deficiency, unspecified: Secondary | ICD-10-CM | POA: Diagnosis not present

## 2018-05-07 DIAGNOSIS — K760 Fatty (change of) liver, not elsewhere classified: Secondary | ICD-10-CM

## 2018-05-07 DIAGNOSIS — K589 Irritable bowel syndrome without diarrhea: Secondary | ICD-10-CM

## 2018-05-07 DIAGNOSIS — R7303 Prediabetes: Secondary | ICD-10-CM

## 2018-05-07 DIAGNOSIS — R079 Chest pain, unspecified: Secondary | ICD-10-CM

## 2018-05-07 DIAGNOSIS — R32 Unspecified urinary incontinence: Secondary | ICD-10-CM

## 2018-05-07 LAB — CBC WITH DIFFERENTIAL/PLATELET
BASOS ABS: 0.1 10*3/uL (ref 0.0–0.1)
Basophils Relative: 1.1 % (ref 0.0–3.0)
EOS ABS: 0.1 10*3/uL (ref 0.0–0.7)
EOS PCT: 1.3 % (ref 0.0–5.0)
HCT: 40.6 % (ref 36.0–46.0)
HEMOGLOBIN: 13.7 g/dL (ref 12.0–15.0)
LYMPHS ABS: 2.1 10*3/uL (ref 0.7–4.0)
Lymphocytes Relative: 25.4 % (ref 12.0–46.0)
MCHC: 33.8 g/dL (ref 30.0–36.0)
MCV: 97.8 fl (ref 78.0–100.0)
MONO ABS: 0.7 10*3/uL (ref 0.1–1.0)
Monocytes Relative: 7.8 % (ref 3.0–12.0)
NEUTROS PCT: 64.4 % (ref 43.0–77.0)
Neutro Abs: 5.4 10*3/uL (ref 1.4–7.7)
Platelets: 305 10*3/uL (ref 150.0–400.0)
RBC: 4.15 Mil/uL (ref 3.87–5.11)
RDW: 13.6 % (ref 11.5–15.5)
WBC: 8.4 10*3/uL (ref 4.0–10.5)

## 2018-05-07 LAB — T4, FREE: FREE T4: 1.21 ng/dL (ref 0.60–1.60)

## 2018-05-07 LAB — TSH: TSH: 1.79 u[IU]/mL (ref 0.35–4.50)

## 2018-05-07 LAB — VITAMIN D 25 HYDROXY (VIT D DEFICIENCY, FRACTURES): VITD: 47.83 ng/mL (ref 30.00–100.00)

## 2018-05-07 MED ORDER — LISINOPRIL-HYDROCHLOROTHIAZIDE 20-12.5 MG PO TABS: 2.0000 | ORAL_TABLET | Freq: Every day | ORAL | 0 refills | Status: DC

## 2018-05-07 NOTE — Progress Notes (Signed)
Pre visit review using our clinic review tool, if applicable. No additional management support is needed unless otherwise documented below in the visit note. 

## 2018-05-07 NOTE — Patient Instructions (Addendum)
Dr. Karle Barr [phone] [address]  Take 1 pill lisinopril hctz x 3 days then increase to 2 pills  Stop Lisinopril 10 mg 2x per day see above F/u in 3 weeks  Consider CT chest lung cancer screening in future    DASH Eating Plan DASH stands for "Dietary Approaches to Stop Hypertension." The DASH eating plan is a healthy eating plan that has been shown to reduce high blood pressure (hypertension). It may also reduce your risk for type 2 diabetes, heart disease, and stroke. The DASH eating plan may also help with weight loss. What are tips for following this plan? General guidelines  Avoid eating more than 2,300 mg (milligrams) of salt (sodium) a day. If you have hypertension, you may need to reduce your sodium intake to 1,500 mg a day.  Limit alcohol intake to no more than 1 drink a day for nonpregnant women and 2 drinks a day for men. One drink equals 12 oz of beer, 5 oz of wine, or 1 oz of hard liquor.  Work with your health care provider to maintain a healthy body weight or to lose weight. Ask what an ideal weight is for you.  Get at least 30 minutes of exercise that causes your heart to beat faster (aerobic exercise) most days of the week. Activities may include walking, swimming, or biking.  Work with your health care provider or diet and nutrition specialist (dietitian) to adjust your eating plan to your individual calorie needs. Reading food labels  Check food labels for the amount of sodium per serving. Choose foods with less than 5 percent of the Daily Value of sodium. Generally, foods with less than 300 mg of sodium per serving fit into this eating plan.  To find whole grains, look for the word "whole" as the first word in the ingredient list. Shopping  Buy products labeled as "low-sodium" or "no salt added."  Buy fresh foods. Avoid canned foods and premade or frozen meals. Cooking  Avoid adding salt when cooking. Use salt-free seasonings or herbs instead of  table salt or sea salt. Check with your health care provider or pharmacist before using salt substitutes.  Do not fry foods. Cook foods using healthy methods such as baking, boiling, grilling, and broiling instead.  Cook with heart-healthy oils, such as olive, canola, soybean, or sunflower oil. Meal planning   Eat a balanced diet that includes: ? 5 or more servings of fruits and vegetables each day. At each meal, try to fill half of your plate with fruits and vegetables. ? Up to 6-8 servings of whole grains each day. ? Less than 6 oz of lean meat, poultry, or fish each day. A 3-oz serving of meat is about the same size as a deck of cards. One egg equals 1 oz. ? 2 servings of low-fat dairy each day. ? A serving of nuts, seeds, or beans 5 times each week. ? Heart-healthy fats. Healthy fats called Omega-3 fatty acids are found in foods such as flaxseeds and coldwater fish, like sardines, salmon, and mackerel.  Limit how much you eat of the following: ? Canned or prepackaged foods. ? Food that is high in trans fat, such as fried foods. ? Food that is high in saturated fat, such as fatty meat. ? Sweets, desserts, sugary drinks, and other foods with added sugar. ? Full-fat dairy products.  Do not salt foods before eating.  Try to eat at least 2 vegetarian meals each week.  Eat  more home-cooked food and less restaurant, buffet, and fast food.  When eating at a restaurant, ask that your food be prepared with less salt or no salt, if possible. What foods are recommended? The items listed may not be a complete list. Talk with your dietitian about what dietary choices are best for you. Grains Whole-grain or whole-wheat bread. Whole-grain or whole-wheat pasta. Brown rice. Modena Morrow. Bulgur. Whole-grain and low-sodium cereals. Pita bread. Low-fat, low-sodium crackers. Whole-wheat flour tortillas. Vegetables Fresh or frozen vegetables (raw, steamed, roasted, or grilled). Low-sodium or  reduced-sodium tomato and vegetable juice. Low-sodium or reduced-sodium tomato sauce and tomato paste. Low-sodium or reduced-sodium canned vegetables. Fruits All fresh, dried, or frozen fruit. Canned fruit in natural juice (without added sugar). Meat and other protein foods Skinless chicken or [place]. Ground chicken or [place]. Pork with fat trimmed off. Fish and seafood. Egg whites. Dried beans, peas, or lentils. Unsalted nuts, nut butters, and seeds. Unsalted canned beans. Lean cuts of beef with fat trimmed off. Low-sodium, lean deli meat. Dairy Low-fat (1%) or fat-free (skim) milk. Fat-free, low-fat, or reduced-fat cheeses. Nonfat, low-sodium ricotta or cottage cheese. Low-fat or nonfat yogurt. Low-fat, low-sodium cheese. Fats and oils Soft margarine without trans fats. Vegetable oil. Low-fat, reduced-fat, or light mayonnaise and salad dressings (reduced-sodium). Canola, safflower, olive, soybean, and sunflower oils. Avocado. Seasoning and other foods Herbs. Spices. Seasoning mixes without salt. Unsalted popcorn and pretzels. Fat-free sweets. What foods are not recommended? The items listed may not be a complete list. Talk with your dietitian about what dietary choices are best for you. Grains Baked goods made with fat, such as croissants, muffins, or some breads. Dry pasta or rice meal packs. Vegetables Creamed or fried vegetables. Vegetables in a cheese sauce. Regular canned vegetables (not low-sodium or reduced-sodium). Regular canned tomato sauce and paste (not low-sodium or reduced-sodium). Regular tomato and vegetable juice (not low-sodium or reduced-sodium). Vanessa Herman. Olives. Fruits Canned fruit in a light or heavy syrup. Fried fruit. Fruit in cream or butter sauce. Meat and other protein foods Fatty cuts of meat. Ribs. Fried meat. Berniece Salines. Sausage. Bologna and other processed lunch meats. Salami. Fatback. Hotdogs. Bratwurst. Salted nuts and seeds. Canned beans with added salt. Canned or  smoked fish. Whole eggs or egg yolks. Chicken or [place] with skin. Dairy Whole or 2% milk, cream, and half-and-half. Whole or full-fat cream cheese. Whole-fat or sweetened yogurt. Full-fat cheese. Nondairy creamers. Whipped toppings. Processed cheese and cheese spreads. Fats and oils Butter. Stick margarine. Lard. Shortening. Ghee. Bacon fat. Tropical oils, such as coconut, palm kernel, or palm oil. Seasoning and other foods Salted popcorn and pretzels. Onion salt, garlic salt, seasoned salt, table salt, and sea salt. Worcestershire sauce. Tartar sauce. Barbecue sauce. Teriyaki sauce. Soy sauce, including reduced-sodium. Steak sauce. Canned and packaged gravies. Fish sauce. Oyster sauce. Cocktail sauce. Horseradish that you find on the shelf. Ketchup. Mustard. Meat flavorings and tenderizers. Bouillon cubes. Hot sauce and Tabasco sauce. Premade or packaged marinades. Premade or packaged taco seasonings. Relishes. Regular salad dressings. Where to find more information:  National Heart, Lung, and [place]: https://wilson-eaton.com/  American Heart Association: www.heart.org Summary  The DASH eating plan is a healthy eating plan that has been shown to reduce high blood pressure (hypertension). It may also reduce your risk for type 2 diabetes, heart disease, and stroke.  With the DASH eating plan, you should limit salt (sodium) intake to 2,300 mg a day. If you have hypertension, you may need to reduce your sodium intake to  1,500 mg a day.  When on the DASH eating plan, aim to eat more fresh fruits and vegetables, whole grains, lean proteins, low-fat dairy, and heart-healthy fats.  Work with your health care provider or diet and nutrition specialist (dietitian) to adjust your eating plan to your individual calorie needs. This information is not intended to replace advice given to you by your health care provider. Make sure you discuss any questions you have with your health care provider. Document  Released: 09/21/2011 Document Revised: 09/25/2016 Document Reviewed: 09/25/2016 Elsevier Interactive Patient Education  2018 [employer].  Hypertension Hypertension, commonly called high blood pressure, is when the force of blood pumping through the arteries is too strong. The arteries are the blood vessels that carry blood from the heart throughout the body. Hypertension forces the heart to work harder to pump blood and may cause arteries to become narrow or stiff. Having untreated or uncontrolled hypertension can cause heart attacks, strokes, kidney disease, and other problems. A blood pressure reading consists of a higher number over a lower number. Ideally, your blood pressure should be below 120/80. The first ("top") number is called the systolic pressure. It is a measure of the pressure in your arteries as your heart beats. The second ("bottom") number is called the diastolic pressure. It is a measure of the pressure in your arteries as the heart relaxes. What are the causes? The cause of this condition is not known. What increases the risk? Some risk factors for high blood pressure are under your control. Others are not. Factors you can change  Smoking.  Having type 2 diabetes mellitus, high cholesterol, or both.  Not getting enough exercise or physical activity.  Being overweight.  Having too much fat, sugar, calories, or salt (sodium) in your diet.  Drinking too much alcohol. Factors that are difficult or impossible to change  Having chronic kidney disease.  Having a family history of high blood pressure.  Age. Risk increases with age.  Race. You may be at higher risk if you are African-American.  Gender. Men are at higher risk than women before age 65. After age 17, women are at higher risk than men.  Having obstructive sleep apnea.  Stress. What are the signs or symptoms? Extremely high blood pressure (hypertensive crisis) may  cause:  Headache.  Anxiety.  Shortness of breath.  Nosebleed.  Nausea and vomiting.  Severe chest pain.  Jerky movements you cannot control (seizures).  How is this diagnosed? This condition is diagnosed by measuring your blood pressure while you are seated, with your arm resting on a surface. The cuff of the blood pressure monitor will be placed directly against the skin of your upper arm at the level of your heart. It should be measured at least twice using the same arm. Certain conditions can cause a difference in blood pressure between your right and left arms. Certain factors can cause blood pressure readings to be lower or higher than normal (elevated) for a short period of time:  When your blood pressure is higher when you are in a health care provider's office than when you are at home, this is called white coat hypertension. Most people with this condition do not need medicines.  When your blood pressure is higher at home than when you are in a health care provider's office, this is called masked hypertension. Most people with this condition may need medicines to control blood pressure.  If you have a high blood pressure reading during one visit  or you have normal blood pressure with other risk factors:  You may be asked to return on a different day to have your blood pressure checked again.  You may be asked to monitor your blood pressure at home for 1 week or longer.  If you are diagnosed with hypertension, you may have other blood or imaging tests to help your health care provider understand your overall risk for other conditions. How is this treated? This condition is treated by making healthy lifestyle changes, such as eating healthy foods, exercising more, and reducing your alcohol intake. Your health care provider may prescribe medicine if lifestyle changes are not enough to get your blood pressure under control, and if:  Your systolic blood pressure is above  130.  Your diastolic blood pressure is above 80.  Your personal target blood pressure may vary depending on your medical conditions, your age, and other factors. Follow these instructions at home: Eating and drinking  Eat a diet that is high in fiber and potassium, and low in sodium, added sugar, and fat. An example eating plan is called the DASH (Dietary Approaches to Stop Hypertension) diet. To eat this way: ? Eat plenty of fresh fruits and vegetables. Try to fill half of your plate at each meal with fruits and vegetables. ? Eat whole grains, such as whole wheat pasta, brown rice, or whole grain bread. Fill about one quarter of your plate with whole grains. ? Eat or drink low-fat dairy products, such as skim milk or low-fat yogurt. ? Avoid fatty cuts of meat, processed or cured meats, and poultry with skin. Fill about one quarter of your plate with lean proteins, such as fish, chicken without skin, beans, eggs, and tofu. ? Avoid premade and processed foods. These tend to be higher in sodium, added sugar, and fat.  Reduce your daily sodium intake. Most people with hypertension should eat less than 1,500 mg of sodium a day.  Limit alcohol intake to no more than 1 drink a day for nonpregnant women and 2 drinks a day for men. One drink equals 12 oz of beer, 5 oz of wine, or 1 oz of hard liquor. Lifestyle  Work with your health care provider to maintain a healthy body weight or to lose weight. Ask what an ideal weight is for you.  Get at least 30 minutes of exercise that causes your heart to beat faster (aerobic exercise) most days of the week. Activities may include walking, swimming, or biking.  Include exercise to strengthen your muscles (resistance exercise), such as pilates or lifting weights, as part of your weekly exercise routine. Try to do these types of exercises for 30 minutes at least 3 days a week.  Do not use any products that contain nicotine or tobacco, such as cigarettes and  e-cigarettes. If you need help quitting, ask your health care provider.  Monitor your blood pressure at home as told by your health care provider.  Keep all follow-up visits as told by your health care provider. This is important. Medicines  Take over-the-counter and prescription medicines only as told by your health care provider. Follow directions carefully. Blood pressure medicines must be taken as prescribed.  Do not skip doses of blood pressure medicine. Doing this puts you at risk for problems and can make the medicine less effective.  Ask your health care provider about side effects or reactions to medicines that you should watch for. Contact a health care provider if:  You think you are having  a reaction to a medicine you are taking.  You have headaches that keep coming back (recurring).  You feel dizzy.  You have swelling in your ankles.  You have trouble with your vision. Get help right away if:  You develop a severe headache or confusion.  You have unusual weakness or numbness.  You feel faint.  You have severe pain in your chest or abdomen.  You vomit repeatedly.  You have trouble breathing. Summary  Hypertension is when the force of blood pumping through your arteries is too strong. If this condition is not controlled, it may put you at risk for serious complications.  Your personal target blood pressure may vary depending on your medical conditions, your age, and other factors. For most people, a normal blood pressure is less than 120/80.  Hypertension is treated with lifestyle changes, medicines, or a combination of both. Lifestyle changes include weight loss, eating a healthy, low-sodium diet, exercising more, and limiting alcohol. This information is not intended to replace advice given to you by your health care provider. Make sure you discuss any questions you have with your health care provider. Document Released: 10/02/2005 Document Revised: 08/30/2016  Document Reviewed: 08/30/2016 Elsevier Interactive Patient Education  2018 [employer].   Nonalcoholic Fatty Liver Disease Diet Nonalcoholic fatty liver disease is a condition that causes fat to accumulate in and around the liver. The disease makes it harder for the liver to work the way that it should. Following a healthy diet can help to keep nonalcoholic fatty liver disease under control. It can also help to prevent or improve conditions that are associated with the disease, such as heart disease, diabetes, high blood pressure, and abnormal cholesterol levels. Along with regular exercise, this diet:  Promotes weight loss.  Helps to control blood sugar levels.  Helps to improve the way that the body uses insulin.  What do I need to know about this diet?  Use the glycemic index (GI) to plan your meals. The index tells you how quickly a food will raise your blood sugar. Choose low-GI foods. These foods take a longer time to raise blood sugar.  Keep track of how many calories you take in. Eating the right amount of calories will help you to achieve a healthy weight.  You may want to follow a Mediterranean diet. This diet includes a lot of vegetables, lean meats or fish, whole grains, fruits, and healthy oils and fats. What foods can I eat? Grains Whole grains, such as whole-wheat or whole-grain breads, crackers, tortillas, cereals, and pasta. Stone-ground whole wheat. Pumpernickel bread. Unsweetened oatmeal. Bulgur. Barley. Quinoa. Brown or wild rice. Corn or whole-wheat flour tortillas. Vegetables Lettuce. Spinach. Peas. Beets. Cauliflower. Cabbage. Broccoli. Carrots. Tomatoes. Squash. Eggplant. Herbs. Peppers. Onions. Cucumbers. Brussels sprouts. Yams and sweet potatoes. Beans. Lentils. Fruits Bananas. Apples. Oranges. Grapes. Papaya. Mango. Pomegranate. Kiwi. Grapefruit. Cherries. Meats and Other Protein Sources Seafood and shellfish. Lean meats. Poultry. Tofu. Dairy Low-fat or  fat-free dairy products, such as yogurt, cottage cheese, and cheese. Beverages Water. Sugar-free drinks. Tea. Coffee. Low-fat or skim milk. Milk alternatives, such as soy or almond milk. Real fruit juice. Condiments Mustard. Relish. Low-fat, low-sugar ketchup and barbecue sauce. Low-fat or fat-free mayonnaise. Sweets and Desserts Sugar-free sweets. Fats and Oils Avocado. Canola or olive oil. Nuts and nut butters. Seeds. The items listed above may not be a complete list of recommended foods or beverages. Contact your dietitian for more options. What foods are not recommended? Palm oil and coconut  oil. Processed foods. Fried foods. Sweetened drinks, such as sweet tea, milkshakes, snow cones, iced sweet drinks, and sodas. Alcohol. Sweets. Foods that contain a lot of salt or sodium. The items listed above may not be a complete list of foods and beverages to avoid. Contact your dietitian for more information. This information is not intended to replace advice given to you by your health care provider. Make sure you discuss any questions you have with your health care provider. Document Released: 02/16/2015 Document Revised: 03/09/2016 Document Reviewed: 10/27/2014 Elsevier Interactive Patient Education  2018 [employer].  Fatty Liver Fatty liver, also called hepatic steatosis or steatohepatitis, is a condition in which too much fat has built up in your liver cells. The liver removes harmful substances from your bloodstream. It produces fluids your body needs. It also helps your body use and store energy from the food you eat. In many cases, fatty liver does not cause symptoms or problems. It is often diagnosed when tests are being done for other reasons. However, over time, fatty liver can cause inflammation that may lead to more serious liver problems, such as scarring of the liver (cirrhosis). What are the causes? Causes of fatty liver may include:  Drinking too much alcohol.  Poor  nutrition.  Obesity.  Cushing syndrome.  Diabetes.  Hyperlipidemia.  Pregnancy.  Certain drugs.  Poisons.  Some viral infections.  What increases the risk? You may be more likely to develop fatty liver if you:  Abuse alcohol.  Are pregnant.  Are overweight.  Have diabetes.  Have hepatitis.  Have a high triglyceride level.  What are the signs or symptoms? Fatty liver often does not cause any symptoms. In cases where symptoms develop, they can include:  Fatigue.  Weakness.  Weight loss.  Confusion.  Abdominal pain.  Yellowing of your skin and the white parts of your eyes (jaundice).  Nausea and vomiting.  How is this diagnosed? Fatty liver may be diagnosed by:  Physical exam and medical history.  Blood tests.  Imaging tests, such as an ultrasound, CT scan, or MRI.  Liver biopsy. A small sample of liver tissue is removed using a needle. The sample is then looked at under a microscope.  How is this treated? Fatty liver is often caused by other health conditions. Treatment for fatty liver may involve medicines and lifestyle changes to manage conditions such as:  Alcoholism.  High cholesterol.  Diabetes.  Being overweight or obese.  Follow these instructions at home:  Eat a healthy diet as directed by your health care provider.  Exercise regularly. This can help you lose weight and control your cholesterol and diabetes. Talk to your health care provider about an exercise plan and which activities are best for you.  Do not drink alcohol.  Take medicines only as directed by your health care provider. Contact a health care provider if: You have difficulty controlling your:  Blood sugar.  Cholesterol.  Alcohol consumption.  Get help right away if:  You have abdominal pain.  You have jaundice.  You have nausea and vomiting. This information is not intended to replace advice given to you by your health care provider. Make sure you  discuss any questions you have with your health care provider. Document Released: 11/17/2005 Document Revised: 03/09/2016 Document Reviewed: 02/11/2014 Elsevier Interactive Patient Education  [employer].

## 2018-05-07 NOTE — Progress Notes (Signed)
Chief Complaint  Patient presents with  . Establish Care    NP   New patient  1. BP uncontrolled on Lis 10 mg bid, metoprolol 25 mg bid she reports she had labs 12/2017 with PCP Dr. Hall Busing ? H/o prediabetes A1C 6.1/6.2  2. C/o night sweats s/p menopause 3. H/o collagenous colitis GI Dr. Allen Norris taking 2-3 entocort qd and rec pt f/u GI about this issue and for the medication. No current diarrhea  4. C/l left knee pain chronic and intermittently she wants to wait on Xray left knee left knee Xray prev. With mild degenerative changes.  5. C/o pin prick sensation to chest and underneath right breasts  6. C/o new urinary incontinence no dysuria.  7. H/o HLD on zocor prev did not tolerate lipitor  Review of Systems  Constitutional: Negative for weight loss.       +night sweats   HENT: Negative for hearing loss.   Eyes: Negative for blurred vision.  Respiratory: Negative for shortness of breath.   Cardiovascular: Positive for chest pain.  Gastrointestinal: Negative for abdominal pain and diarrhea.  Genitourinary: Negative for dysuria.       +incontinence   Musculoskeletal: Positive for joint pain.  Skin: Negative for rash.  Neurological: Negative for dizziness.  Psychiatric/Behavioral: Negative for depression. The patient is nervous/anxious.    Past Medical History:  Diagnosis Date  . Allergy   . Anxiety   . Chicken pox   . Collagenous colitis    Dr. Allen Norris   . Colon polyps   . DDD (degenerative disc disease), cervical    had PT in the past, noted imaging 05/31/10   . Depression   . Fatty liver   . Ganglion cyst of wrist, left   . Hemorrhoids   . Hemorrhoids   . History of colon polyps   . History of prediabetes   . Hyperlipidemia 07/06/2015  . Hypertension   . IBS (irritable bowel syndrome) 07/06/2015  . NS (neck stiffness)    xray shows something at "C3" approx 4 yrs ago. Had PT. Helped.  . Polycythemia, secondary 07/06/2015  . Prediabetes   . Urinary incontinence   . Vitamin D  deficiency 07/06/2015   Past Surgical History:  Procedure Laterality Date  . COLONOSCOPY  2006  . COLONOSCOPY WITH PROPOFOL N/A 08/12/2015   Procedure: COLONOSCOPY WITH PROPOFOL;  Surgeon: [name], MD;  Location: [facility];  Service: Endoscopy;  Laterality: N/A;  PT WOULD LIKE EARLY AM PER JS  . HYSTEROSALPINGOGRAM     1983  . TONSILLECTOMY AND ADENOIDECTOMY     age [age]   Family History  Problem Relation Age of Onset  . Breast cancer Maternal Grandmother 60  . Cancer Maternal Grandmother        breast  . Hypertension Father   . Hyperlipidemia Father   . COPD Father   . Hearing loss Father   . Heart disease Father   . Diabetes Mother   . Hyperlipidemia Mother   . Stroke Mother   . Thyroid disease Daughter   . Heart disease Maternal Grandfather   . Heart disease Paternal Grandfather   . Osteoporosis Other    Social History   Socioeconomic History  . Marital status: Married    Spouse name: Not on file  . Number of children: Not on file  . Years of education: Not on file  . Highest education level: Not on file  Occupational History  . Not on file  Social Needs  .  Financial resource strain: Not on file  . Food insecurity:    Worry: Not on file    Inability: Not on file  . Transportation needs:    Medical: Not on file    Non-medical: Not on file  Tobacco Use  . Smoking status: Former Smoker    Last attempt to quit: 04/03/2014    Years since quitting: 4.1  . Smokeless tobacco: Never Used  Substance and Sexual Activity  . Alcohol use: Yes    Alcohol/week: 0.0 oz  . Drug use: No  . Sexual activity: Not Currently  Lifestyle  . Physical activity:    Days per week: Not on file    Minutes per session: Not on file  . Stress: Not on file  Relationships  . Social connections:    Talks on phone: Not on file    Gets together: Not on file    Attends religious service: Not on file    Active member of club or organization: Not on file    Attends meetings of  clubs or organizations: Not on file    Relationship status: Not on file  . Intimate partner violence:    Fear of current or ex partner: Not on file    Emotionally abused: Not on file    Physically abused: Not on file    Forced sexual activity: Not on file  Other Topics Concern  . Not on file  Social History Narrative   Married    Some college, retired    2 daughters    No guns    Wears seat belt    Safe in relationship    Current Meds  Medication Sig  . aspirin 81 MG tablet Take 81 mg by mouth daily.  . budesonide (ENTOCORT EC) 3 MG 24 hr capsule Take 3 capsules (9 mg total) by mouth every morning.  . calcium carbonate (CALCIUM 600) 600 MG TABS tablet Calcium 600 + D(3) 600 mg (1,500 mg)-400 unit tablet  . Cholecalciferol (VITAMIN D) 2000 units CAPS Vitamin D3 2,000 unit capsule   1 capsule every day by oral route.  . diazepam (VALIUM) 5 MG tablet Take 5 mg by mouth daily as needed for anxiety.   . diphenhydrAMINE (BENADRYL) 50 MG tablet Take 50 mg by mouth at bedtime as needed for sleep.   . metoprolol tartrate (LOPRESSOR) 25 MG tablet Take 25 mg by mouth 2 (two) times daily.   . Multiple Vitamin (MULTIVITAMIN) tablet Take 1 tablet by mouth daily.  . Multiple Vitamins-Minerals (MULTIVITAMIN ADULT PO) Complete Multivitamin  . simvastatin (ZOCOR) 20 MG tablet Take 1 tablet (20 mg total) by mouth daily at 6 PM.  . [DISCONTINUED] lisinopril (PRINIVIL,ZESTRIL) 10 MG tablet TK 1 T PO BID  . [DISCONTINUED] simvastatin (ZOCOR) 20 MG tablet Take 20 mg by mouth daily.   Allergies  Allergen Reactions  . Lactase Diarrhea  . Lipitor [Atorvastatin] Swelling    Swelling and leg pain  . Neomycin Other (See Comments)  . Pollen Extract Other (See Comments)  . Levaquin [Levofloxacin In D5w] Nausea And Vomiting    Lightheadedness    Recent Results (from the past 2160 hour(s))  CBC with Differential/Platelet     Status: None   Collection Time: 05/07/18 11:42 AM  Result Value Ref Range    WBC 8.4 4.0 - 10.5 K/uL   RBC 4.15 3.87 - 5.11 Mil/uL   Hemoglobin 13.7 12.0 - 15.0 g/dL   HCT 40.6 36.0 - 46.0 %   MCV  97.8 78.0 - 100.0 fl   MCHC 33.8 30.0 - 36.0 g/dL   RDW 13.6 11.5 - 15.5 %   Platelets 305.0 150.0 - 400.0 K/uL   Neutrophils Relative % 64.4 43.0 - 77.0 %   Lymphocytes Relative 25.4 12.0 - 46.0 %   Monocytes Relative 7.8 3.0 - 12.0 %   Eosinophils Relative 1.3 0.0 - 5.0 %   Basophils Relative 1.1 0.0 - 3.0 %   Neutro Abs 5.4 1.4 - 7.7 K/uL   Lymphs Abs 2.1 0.7 - 4.0 K/uL   Monocytes Absolute 0.7 0.1 - 1.0 K/uL   Eosinophils Absolute 0.1 0.0 - 0.7 K/uL   Basophils Absolute 0.1 0.0 - 0.1 K/uL  TSH     Status: None   Collection Time: 05/07/18 11:42 AM  Result Value Ref Range   TSH 1.79 0.35 - 4.50 uIU/mL  T4, free     Status: None   Collection Time: 05/07/18 11:42 AM  Result Value Ref Range   Free T4 1.21 0.60 - 1.60 ng/dL    Comment: Specimens from patients who are undergoing biotin therapy and /or ingesting biotin supplements may contain high levels of biotin.  The higher biotin concentration in these specimens interferes with this Free T4 assay.  Specimens that contain high levels  of biotin may cause false high results for this Free T4 assay.  Please interpret results in light of the total clinical presentation of the patient.    Vitamin D (25 hydroxy)     Status: None   Collection Time: 05/07/18 11:42 AM  Result Value Ref Range   VITD 47.83 30.00 - 100.00 ng/mL  COMPLETE METABOLIC PANEL WITH GFR     Status: Abnormal   Collection Time: 05/07/18 11:43 AM  Result Value Ref Range   Glucose, Bld 105 (H) 65 - 99 mg/dL    Comment: .            Fasting reference interval . For someone without known diabetes, a glucose value between 100 and 125 mg/dL is consistent with prediabetes and should be confirmed with a follow-up test. .    BUN 12 7 - 25 mg/dL   Creat 0.84 0.50 - 0.99 mg/dL    Comment: For patients >73 years of age, the reference limit for  Creatinine is approximately 13% higher for people identified as African-American. .    GFR, Est Non African American 74 > OR = 60 mL/min/1.56m   GFR, Est African American 86 > OR = 60 mL/min/1.758m  BUN/Creatinine Ratio NOT APPLICABLE 6 - 22 (calc)   Sodium 139 135 - 146 mmol/L   Potassium 4.3 3.5 - 5.3 mmol/L   Chloride 99 98 - 110 mmol/L   CO2 30 20 - 32 mmol/L   Calcium 9.8 8.6 - 10.4 mg/dL   Total Protein 7.2 6.1 - 8.1 g/dL   Albumin 4.7 3.6 - 5.1 g/dL   Globulin 2.5 1.9 - 3.7 g/dL (calc)   AG Ratio 1.9 1.0 - 2.5 (calc)   Total Bilirubin 0.6 0.2 - 1.2 mg/dL   Alkaline phosphatase (APISO) 60 33 - 130 U/L   AST 25 10 - 35 U/L   ALT 27 6 - 29 U/L  Urinalysis, Routine w reflex microscopic     Status: None   Collection Time: 05/07/18 11:43 AM  Result Value Ref Range   Specific Gravity, UA 1.005 1.005 - 1.030   pH, UA 5.5 5.0 - 7.5   Color, UA Yellow Yellow   Appearance  Ur Clear Clear   Leukocytes, UA Negative Negative   Protein, UA Negative Negative/Trace   Glucose, UA Negative Negative   Ketones, UA Negative Negative   RBC, UA Negative Negative   Bilirubin, UA Negative Negative   Urobilinogen, Ur 0.2 0.2 - 1.0 mg/dL   Nitrite, UA Negative Negative   Microscopic Examination Comment     Comment: Microscopic not indicated and not performed.  Urine Culture     Status: None   Collection Time: 05/07/18 11:43 AM  Result Value Ref Range   Urine Culture, Routine Final report    Organism ID, Bacteria No growth   Hepatitis B surface antibody     Status: Abnormal   Collection Time: 05/07/18 11:52 AM  Result Value Ref Range   Hepatitis B-Post <5 (L) > OR = 10 mIU/mL    Comment: . Patient does not have immunity to hepatitis B virus. . For additional information, please refer to http://education.[facility].com/faq/FAQ105 (This link is being provided for informational/ educational purposes only).   Hepatitis C antibody     Status: None   Collection Time: 05/07/18 11:52  AM  Result Value Ref Range   Hepatitis C Ab NON-REACTIVE NON-REACTI   SIGNAL TO CUT-OFF 0.01 <1.00    Comment: . HCV antibody was non-reactive. There is no laboratory  evidence of HCV infection. . In most cases, no further action is required. However, if recent HCV exposure is suspected, a test for HCV RNA (test code [id number]) is suggested. . For additional information please refer to http://education.[facility].com/faq/FAQ22v1 (This link is being provided for informational/ educational purposes only.) .   Hepatitis B surface antigen     Status: None   Collection Time: 05/07/18 11:52 AM  Result Value Ref Range   Hepatitis B Surface Ag NON-REACTIVE NON-REACTI  Hepatitis A Ab, Total     Status: None   Collection Time: 05/07/18 11:52 AM  Result Value Ref Range   Hepatitis A AB,Total NON-REACTIVE NON-REACTI  Measles/Mumps/Rubella Immunity     Status: None   Collection Time: 05/07/18 11:52 AM  Result Value Ref Range   Rubeola IgG >300.00 AU/mL    Comment: AU/mL            Interpretation -----            -------------- <25.00           Negative 25.00-29.99      Equivocal >29.99           Positive . A positive result indicates that the patient has antibody to measles virus. It does not differentiate  between an active or past infection. The clinical  diagnosis must be interpreted in conjunction with  clinical signs and symptoms of the patient.    Mumps IgG 64.90 AU/mL    Comment:  AU/mL           Interpretation -------         ---------------- <9.00             Negative 9.00-10.99        Equivocal >10.99            Positive A positive result indicates that the patient has  antibody to mumps virus. It does not differentiate between an  active or past infection. The clinical diagnosis must be interpreted in conjunction with clinical signs and symptoms of the patient. .    Rubella 28.50 index    Comment:     Index  Interpretation     -----             --------------       <0.90            Not consistent with Immunity     0.90-0.99        Equivocal     > or = 1.00      Consistent with Immunity  . The presence of rubella IgG antibody suggests  immunization or past or current infection with rubella virus.    Objective  Body mass index is 29.97 kg/m. Wt Readings from Last 3 Encounters:  05/07/18 143 lb 6.4 oz (65 kg)  11/27/17 142 lb 3.2 oz (64.5 kg)  10/25/16 133 lb (60.3 kg)   Temp Readings from Last 3 Encounters:  05/07/18 98.8 F (37.1 C) (Oral)  10/25/16 98.8 F (37.1 C) (Oral)  08/18/15 98.5 F (36.9 C) (Oral)   BP Readings from Last 3 Encounters:  05/07/18 (!) 162/98  11/27/17 (!) 158/88  10/25/16 122/63   Pulse Readings from Last 3 Encounters:  05/07/18 79  11/27/17 78  10/25/16 65    Physical Exam  Constitutional: She is oriented to person, place, and time. Vital signs are normal. She appears well-developed and well-nourished. She is cooperative.  HENT:  Head: Normocephalic and atraumatic.  Mouth/Throat: Oropharynx is clear and moist and mucous membranes are normal.  Eyes: Pupils are equal, round, and reactive to light. Conjunctivae are normal.  Cardiovascular: Normal rate, regular rhythm and normal heart sounds.  Pulmonary/Chest: Effort normal and breath sounds normal.  No chest wall ttp   Neurological: She is alert and oriented to person, place, and time. Gait normal.  Skin: Skin is warm, dry and intact.  Psychiatric: She has a normal mood and affect. Her speech is normal and behavior is normal. Judgment and thought content normal. Cognition and memory are normal.  Nursing note and vitals reviewed.   Assessment   1. HTN uncontrolled/HLD 2. H/o collagenous colitis/IBS//fatty liver US 01/11/16 + 3. Night sweats ? Etiology  4. Left knee pain h/o mild deg. Changes L knee xray years ago  5. Non specific chest sensation/pain 6. Urinary incontinence 7.HM  Plan  1. Former PCP had her on lisinopril 10 mg  qd-bid -change therapy to lis 20 hctz 12.5 2 pills qd=40-25 mg qd  -cont metoprolol 25 mg bid for now  zocor 20 mg qhs refilled  Prev did not tolerate lipitor due to cramps per PCP notes not allergic zocor requesting refill   See labs below Will need to check lipid in future  2. Budesonide 3 mg 24 hr pt taking 2-3 tablets qd per pt GI rec PCP refill (not sure this info is accurate) informed pt this is GI issue will rec GI refill Budesonide if rec.tx for collagenous colitis   She did report she may have had withdrawal sx's she experienced when w/o medications in the past  No current diarrhea with medication 2-3 per day  Check CMET, CBC, TSH, T4, Hep A/B/C, MMR status, UA/culture, vit D labs for #1 and #2  3.  Consider further w/u I.e CXR, check labs 1st   4.  Reviewed knee Xray 01/12/15 mild deg changes consider repeat in future   5. With FH consider cards referral in future to r/o cardiac etiology   6 .UA and culture today  7.  Flu shot had 07/1017  Check [facility] Tdap  Consider shingrix in future  mammo had 06/20/17 neg due  06/20/18 referred today  Colonoscopy [facility] 08/12/15 IH and + collagenous colitis will rec pt f/u with GI for these medications. Also h/o colon polyps   Pap per note had 05/15/14 or 2017?  LMP age [age]  - need to get copy Dr. 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  Last eye exam 2019 [facility]   Obtained records former PCP Dr. Benita Stabile above no labs, no pap will request again.  GI Dr. Allen Norris  Dentist Dr. Barnie Alderman   Physical at f/u and consider check a1C  Provider: Dr. Olivia Mackie McLean-Scocuzza-[facility]

## 2018-05-08 LAB — HEPATITIS C ANTIBODY
Hepatitis C Ab: NONREACTIVE
SIGNAL TO CUT-OFF: 0.01 (ref <1.00)

## 2018-05-08 LAB — COMPLETE METABOLIC PANEL WITH GFR
AG RATIO: 1.9 (calc) (ref 1.0–2.5)
ALBUMIN MSPROF: 4.7 g/dL (ref 3.6–5.1)
ALKALINE PHOSPHATASE (APISO): 60 U/L (ref 33–130)
ALT: 27 U/L (ref 6–29)
AST: 25 U/L (ref 10–35)
BUN: 12 mg/dL (ref 7–25)
CALCIUM: 9.8 mg/dL (ref 8.6–10.4)
CO2: 30 mmol/L (ref 20–32)
CREATININE: 0.84 mg/dL (ref 0.50–0.99)
Chloride: 99 mmol/L (ref 98–110)
GFR, EST NON AFRICAN AMERICAN: 74 mL/min/{1.73_m2} (ref >=60)
GFR, Est African American: 86 mL/min/{1.73_m2} (ref >=60)
GLOBULIN: 2.5 g/dL (ref 1.9–3.7)
Glucose, Bld: 105 mg/dL — ABNORMAL HIGH (ref 65–99)
POTASSIUM: 4.3 mmol/L (ref 3.5–5.3)
SODIUM: 139 mmol/L (ref 135–146)
Total Bilirubin: 0.6 mg/dL (ref 0.2–1.2)
Total Protein: 7.2 g/dL (ref 6.1–8.1)

## 2018-05-08 LAB — URINALYSIS, ROUTINE W REFLEX MICROSCOPIC
Bilirubin, UA: NEGATIVE
Glucose, UA: NEGATIVE
Ketones, UA: NEGATIVE
LEUKOCYTES UA: NEGATIVE
Nitrite, UA: NEGATIVE
PH UA: 5.5 (ref 5.0–7.5)
PROTEIN UA: NEGATIVE
RBC, UA: NEGATIVE
Specific Gravity, UA: 1.005 (ref 1.005–1.030)
Urobilinogen, Ur: 0.2 mg/dL (ref 0.2–1.0)

## 2018-05-08 LAB — MEASLES/MUMPS/RUBELLA IMMUNITY
MUMPS IGG: 64.9 [AU]/ml
RUBELLA: 28.5 {index}
Rubeola IgG: >300 AU/mL

## 2018-05-08 LAB — HEPATITIS B SURFACE ANTIGEN: HEP B S AG: NONREACTIVE

## 2018-05-08 LAB — HEPATITIS A ANTIBODY, TOTAL: HEPATITIS A AB,TOTAL: NONREACTIVE

## 2018-05-08 LAB — HEPATITIS B SURFACE ANTIBODY, QUANTITATIVE: Hepatitis B-Post: <5 m[IU]/mL — ABNORMAL LOW (ref >=10)

## 2018-05-09 LAB — URINE CULTURE: Organism ID, Bacteria: NO GROWTH

## 2018-05-12 ENCOUNTER — Encounter: Payer: Self-pay | Admitting: Internal Medicine

## 2018-05-12 MED ORDER — SIMVASTATIN 20 MG PO TABS: 20.0000 mg | ORAL_TABLET | Freq: Every day | ORAL | 1 refills | Status: DC

## 2018-05-13 ENCOUNTER — Telehealth: Payer: Self-pay | Admitting: Internal Medicine

## 2018-05-13 DIAGNOSIS — R61 Generalized hyperhidrosis: Secondary | ICD-10-CM | POA: Insufficient documentation

## 2018-05-13 DIAGNOSIS — M810 Age-related osteoporosis without current pathological fracture: Secondary | ICD-10-CM | POA: Insufficient documentation

## 2018-05-13 DIAGNOSIS — I10 Essential (primary) hypertension: Secondary | ICD-10-CM | POA: Insufficient documentation

## 2018-05-13 DIAGNOSIS — K760 Fatty (change of) liver, not elsewhere classified: Secondary | ICD-10-CM | POA: Insufficient documentation

## 2018-05-13 DIAGNOSIS — M8580 Other specified disorders of bone density and structure, unspecified site: Secondary | ICD-10-CM | POA: Insufficient documentation

## 2018-05-13 NOTE — Telephone Encounter (Signed)
Check [facility] for  1. Tdap   Contact former PCP Dr. Benita Stabile no labs sent or pap records sent with recent records please fax to our office   [place]

## 2018-05-13 NOTE — Progress Notes (Signed)
Noted no vaccines in [facility]

## 2018-05-13 NOTE — Progress Notes (Signed)
Patient not in [facility]. 

## 2018-05-16 ENCOUNTER — Telehealth: Payer: Self-pay | Admitting: Gastroenterology

## 2018-05-16 NOTE — Telephone Encounter (Signed)
Pt is currently taking Budesonide 9mg  daily. She stated you had told her to take a break from it every 3 months. She is wanting to know should she continue taking it like this or just continue taking daily.

## 2018-05-16 NOTE — Telephone Encounter (Signed)
Patient Vanessa Herman that she has medication questions regarding the Budesonide. She also was wondering about MY CHART but I see she is active on it. She may not know this. Please call

## 2018-05-19 NOTE — Telephone Encounter (Signed)
Let her know that she should stop it if her diarrhea is better but restarted if her diarrhea gets bad again.

## 2018-05-21 ENCOUNTER — Encounter: Payer: Self-pay | Admitting: Internal Medicine

## 2018-05-21 ENCOUNTER — Ambulatory Visit (INDEPENDENT_AMBULATORY_CARE_PROVIDER_SITE_OTHER): Payer: BLUE CROSS/BLUE SHIELD

## 2018-05-21 ENCOUNTER — Ambulatory Visit: Payer: BLUE CROSS/BLUE SHIELD | Admitting: Internal Medicine

## 2018-05-21 VITALS — BP 124/76 | HR 76 | Temp 98.8°F | Ht <= 58 in | Wt 141.8 lb

## 2018-05-21 DIAGNOSIS — F17211 Nicotine dependence, cigarettes, in remission: Secondary | ICD-10-CM

## 2018-05-21 DIAGNOSIS — I10 Essential (primary) hypertension: Secondary | ICD-10-CM

## 2018-05-21 DIAGNOSIS — G8929 Other chronic pain: Secondary | ICD-10-CM

## 2018-05-21 DIAGNOSIS — M545 Low back pain: Secondary | ICD-10-CM | POA: Diagnosis not present

## 2018-05-21 DIAGNOSIS — E785 Hyperlipidemia, unspecified: Secondary | ICD-10-CM

## 2018-05-21 DIAGNOSIS — M542 Cervicalgia: Secondary | ICD-10-CM

## 2018-05-21 DIAGNOSIS — K760 Fatty (change of) liver, not elsewhere classified: Secondary | ICD-10-CM

## 2018-05-21 DIAGNOSIS — R739 Hyperglycemia, unspecified: Secondary | ICD-10-CM

## 2018-05-21 DIAGNOSIS — Z23 Encounter for immunization: Secondary | ICD-10-CM

## 2018-05-21 DIAGNOSIS — R002 Palpitations: Secondary | ICD-10-CM | POA: Diagnosis not present

## 2018-05-21 DIAGNOSIS — Z8249 Family history of ischemic heart disease and other diseases of the circulatory system: Secondary | ICD-10-CM

## 2018-05-21 DIAGNOSIS — R7303 Prediabetes: Secondary | ICD-10-CM

## 2018-05-21 DIAGNOSIS — R42 Dizziness and giddiness: Secondary | ICD-10-CM

## 2018-05-21 NOTE — Progress Notes (Signed)
Pre visit review using our clinic review tool, if applicable. No additional management support is needed unless otherwise documented below in the visit note. 

## 2018-05-21 NOTE — Progress Notes (Addendum)
Chief Complaint  Patient presents with  . Follow-up   F/u  1. BP improved readings at home improved on lis-hct 2 x 20-12.5, lopressor 25 bid though at times she feels lightheaded  2. C/o palpitations and FH of father died of MI and both grandfathers heart disease and grandmother with CAS. She also would like her carotids checked for blockages and coronaries checked for blockages with noninvasive testing for baseling w/u for now  3. C/o chronic neck and low back pain and would like Xray  C/o hip pain intermittently will hold on Xray of hip today  4. H/o tobacco abuse quit in 2015 calc CT chest risk score 2.2 % will refer CT chest  5. H/o fatty liver rec hep A/B vaccine today   Review of Systems  Constitutional: Negative for weight loss.  HENT: Negative for hearing loss.   Eyes: Negative for blurred vision.  Respiratory: Negative for shortness of breath.   Cardiovascular: Positive for palpitations. Negative for chest pain.  Musculoskeletal: Positive for back pain and neck pain.  Neurological: Positive for dizziness. Negative for headaches.  Psychiatric/Behavioral: Negative for depression.   Past Medical History:  Diagnosis Date  . Allergy   . Anxiety   . Chicken pox   . Collagenous colitis    Dr. Allen Norris   . Colon polyps   . DDD (degenerative disc disease), cervical    had PT in the past, noted imaging 05/31/10   . Depression   . Fatty liver   . Ganglion cyst of wrist, left   . Hemorrhoids   . Hemorrhoids   . History of colon polyps   . History of prediabetes   . Hyperlipidemia 07/06/2015  . Hypertension   . IBS (irritable bowel syndrome) 07/06/2015  . NS (neck stiffness)    xray shows something at "C3" approx 4 yrs ago. Had PT. Helped.  . Polycythemia, secondary 07/06/2015  . Prediabetes   . Urinary incontinence   . Vitamin D deficiency 07/06/2015   Past Surgical History:  Procedure Laterality Date  . COLONOSCOPY  2006  . COLONOSCOPY WITH PROPOFOL N/A 08/12/2015   Procedure: COLONOSCOPY WITH PROPOFOL;  Surgeon: [name], MD;  Location: [facility];  Service: Endoscopy;  Laterality: N/A;  PT WOULD LIKE EARLY AM PER JS  . HYSTEROSALPINGOGRAM     1983  . TONSILLECTOMY AND ADENOIDECTOMY     age [age]   Family History  Problem Relation Age of Onset  . Breast cancer Maternal Grandmother 60  . Cancer Maternal Grandmother        breast  . Hypertension Father   . Hyperlipidemia Father   . COPD Father   . Hearing loss Father   . Heart disease Father   . Diabetes Mother   . Hyperlipidemia Mother   . Stroke Mother   . Thyroid disease Daughter   . Heart disease Maternal Grandfather   . Heart disease Paternal Grandfather   . Osteoporosis Other    Social History   Socioeconomic History  . Marital status: Married    Spouse name: Not on file  . Number of children: Not on file  . Years of education: Not on file  . Highest education level: Not on file  Occupational History  . Not on file  Social Needs  . Financial resource strain: Not on file  . Food insecurity:    Worry: Not on file    Inability: Not on file  . Transportation needs:    Medical: Not on  file    Non-medical: Not on file  Tobacco Use  . Smoking status: Former Smoker    Last attempt to quit: 04/03/2014    Years since quitting: 4.1  . Smokeless tobacco: Never Used  Substance and Sexual Activity  . Alcohol use: Yes    Alcohol/week: 0.0 oz  . Drug use: No  . Sexual activity: Not Currently  Lifestyle  . Physical activity:    Days per week: Not on file    Minutes per session: Not on file  . Stress: Not on file  Relationships  . Social connections:    Talks on phone: Not on file    Gets together: Not on file    Attends religious service: Not on file    Active member of club or organization: Not on file    Attends meetings of clubs or organizations: Not on file    Relationship status: Not on file  . Intimate partner violence:    Fear of current or ex partner: Not on  file    Emotionally abused: Not on file    Physically abused: Not on file    Forced sexual activity: Not on file  Other Topics Concern  . Not on file  Social History Narrative   Married    Some college, retired    2 daughters    No guns    Wears seat belt    Safe in relationship    Current Meds  Medication Sig  . aspirin 81 MG tablet Take 81 mg by mouth every other day.   . budesonide (ENTOCORT EC) 3 MG 24 hr capsule Take 3 capsules (9 mg total) by mouth every morning.  . calcium carbonate (CALCIUM 600) 600 MG TABS tablet Calcium 600 + D(3) 600 mg (1,500 mg)-400 unit tablet  . Cholecalciferol (VITAMIN D) 2000 units CAPS Vitamin D3 2,000 unit capsule   1 capsule every day by oral route.  . diazepam (VALIUM) 5 MG tablet Take 5 mg by mouth daily as needed for anxiety.   . diphenhydrAMINE (BENADRYL) 50 MG tablet Take 50 mg by mouth at bedtime as needed for sleep.   Marland Kitchen lisinopril-hydrochlorothiazide (ZESTORETIC) 20-12.5 MG tablet Take 2 tablets by mouth daily.  . metoprolol tartrate (LOPRESSOR) 25 MG tablet Take 25 mg by mouth daily.   . Multiple Vitamin (MULTIVITAMIN) tablet Take 1 tablet by mouth every other day.   . Multiple Vitamins-Minerals (MULTIVITAMIN ADULT PO) Complete Multivitamin  . simvastatin (ZOCOR) 20 MG tablet Take 1 tablet (20 mg total) by mouth daily at 6 PM.   Allergies  Allergen Reactions  . Lactase Diarrhea  . Lipitor [Atorvastatin] Swelling    Swelling and leg pain  . Neomycin Other (See Comments)  . Pollen Extract Other (See Comments)  . Levaquin [Levofloxacin In D5w] Nausea And Vomiting    Lightheadedness    Recent Results (from the past 2160 hour(s))  CBC with Differential/Platelet     Status: None   Collection Time: 05/07/18 11:42 AM  Result Value Ref Range   WBC 8.4 4.0 - 10.5 K/uL   RBC 4.15 3.87 - 5.11 Mil/uL   Hemoglobin 13.7 12.0 - 15.0 g/dL   HCT 40.6 36.0 - 46.0 %   MCV 97.8 78.0 - 100.0 fl   MCHC 33.8 30.0 - 36.0 g/dL   RDW 13.6 11.5 -  15.5 %   Platelets 305.0 150.0 - 400.0 K/uL   Neutrophils Relative % 64.4 43.0 - 77.0 %   Lymphocytes Relative 25.4 12.0 -  46.0 %   Monocytes Relative 7.8 3.0 - 12.0 %   Eosinophils Relative 1.3 0.0 - 5.0 %   Basophils Relative 1.1 0.0 - 3.0 %   Neutro Abs 5.4 1.4 - 7.7 K/uL   Lymphs Abs 2.1 0.7 - 4.0 K/uL   Monocytes Absolute 0.7 0.1 - 1.0 K/uL   Eosinophils Absolute 0.1 0.0 - 0.7 K/uL   Basophils Absolute 0.1 0.0 - 0.1 K/uL  TSH     Status: None   Collection Time: 05/07/18 11:42 AM  Result Value Ref Range   TSH 1.79 0.35 - 4.50 uIU/mL  T4, free     Status: None   Collection Time: 05/07/18 11:42 AM  Result Value Ref Range   Free T4 1.21 0.60 - 1.60 ng/dL    Comment: Specimens from patients who are undergoing biotin therapy and /or ingesting biotin supplements may contain high levels of biotin.  The higher biotin concentration in these specimens interferes with this Free T4 assay.  Specimens that contain high levels  of biotin may cause false high results for this Free T4 assay.  Please interpret results in light of the total clinical presentation of the patient.    Vitamin D (25 hydroxy)     Status: None   Collection Time: 05/07/18 11:42 AM  Result Value Ref Range   VITD 47.83 30.00 - 100.00 ng/mL  COMPLETE METABOLIC PANEL WITH GFR     Status: Abnormal   Collection Time: 05/07/18 11:43 AM  Result Value Ref Range   Glucose, Bld 105 (H) 65 - 99 mg/dL    Comment: .            Fasting reference interval . For someone without known diabetes, a glucose value between 100 and 125 mg/dL is consistent with prediabetes and should be confirmed with a follow-up test. .    BUN 12 7 - 25 mg/dL   Creat 0.84 0.50 - 0.99 mg/dL    Comment: For patients >61 years of age, the reference limit for Creatinine is approximately 13% higher for people identified as African-American. .    GFR, Est Non African American 74 > OR = 60 mL/min/1.46m2   GFR, Est African American 86 > OR = 60  mL/min/1.55m2   BUN/Creatinine Ratio NOT APPLICABLE 6 - 22 (calc)   Sodium 139 135 - 146 mmol/L   Potassium 4.3 3.5 - 5.3 mmol/L   Chloride 99 98 - 110 mmol/L   CO2 30 20 - 32 mmol/L   Calcium 9.8 8.6 - 10.4 mg/dL   Total Protein 7.2 6.1 - 8.1 g/dL   Albumin 4.7 3.6 - 5.1 g/dL   Globulin 2.5 1.9 - 3.7 g/dL (calc)   AG Ratio 1.9 1.0 - 2.5 (calc)   Total Bilirubin 0.6 0.2 - 1.2 mg/dL   Alkaline phosphatase (APISO) 60 33 - 130 U/L   AST 25 10 - 35 U/L   ALT 27 6 - 29 U/L  Urinalysis, Routine w reflex microscopic     Status: None   Collection Time: 05/07/18 11:43 AM  Result Value Ref Range   Specific Gravity, UA 1.005 1.005 - 1.030   pH, UA 5.5 5.0 - 7.5   Color, UA Yellow Yellow   Appearance Ur Clear Clear   Leukocytes, UA Negative Negative   Protein, UA Negative Negative/Trace   Glucose, UA Negative Negative   Ketones, UA Negative Negative   RBC, UA Negative Negative   Bilirubin, UA Negative Negative   Urobilinogen, Ur 0.2 0.2 -  1.0 mg/dL   Nitrite, UA Negative Negative   Microscopic Examination Comment     Comment: Microscopic not indicated and not performed.  Urine Culture     Status: None   Collection Time: 05/07/18 11:43 AM  Result Value Ref Range   Urine Culture, Routine Final report    Organism ID, Bacteria No growth   Hepatitis B surface antibody     Status: Abnormal   Collection Time: 05/07/18 11:52 AM  Result Value Ref Range   Hepatitis B-Post <5 (L) > OR = 10 mIU/mL    Comment: . Patient does not have immunity to hepatitis B virus. . For additional information, please refer to http://education.questdiagn[facility].com/faq/FAQ105 (This link is being provided for informational/ educational purposes only).   Hepatitis C antibody     Status: None   Collection Time: 05/07/18 11:52 AM  Result Value Ref Range   Hepatitis C Ab NON-REACTIVE NON-REACTI   SIGNAL TO CUT-OFF 0.01 <1.00    Comment: . HCV antibody was non-reactive. There is no laboratory  evidence of HCV  infection. . In most cases, no further action is required. However, if recent HCV exposure is suspected, a test for HCV RNA (test code [id number]) is suggested. . For additional information please refer to http://education.[facility].com/faq/FAQ22v1 (This link is being provided for informational/ educational purposes only.) .   Hepatitis B surface antigen     Status: None   Collection Time: 05/07/18 11:52 AM  Result Value Ref Range   Hepatitis B Surface Ag NON-REACTIVE NON-REACTI  Hepatitis A Ab, Total     Status: None   Collection Time: 05/07/18 11:52 AM  Result Value Ref Range   Hepatitis A AB,Total NON-REACTIVE NON-REACTI  Measles/Mumps/Rubella Immunity     Status: None   Collection Time: 05/07/18 11:52 AM  Result Value Ref Range   Rubeola IgG >300.00 AU/mL    Comment: AU/mL            Interpretation -----            -------------- <25.00           Negative 25.00-29.99      Equivocal >29.99           Positive . A positive result indicates that the patient has antibody to measles virus. It does not differentiate  between an active or past infection. The clinical  diagnosis must be interpreted in conjunction with  clinical signs and symptoms of the patient.    Mumps IgG 64.90 AU/mL    Comment:  AU/mL           Interpretation -------         ---------------- <9.00             Negative 9.00-10.99        Equivocal >10.99            Positive A positive result indicates that the patient has  antibody to mumps virus. It does not differentiate between an  active or past infection. The clinical diagnosis must be interpreted in conjunction with clinical signs and symptoms of the patient. .    Rubella 28.50 index    Comment:     Index            Interpretation     -----            --------------       <0.90            Not consistent with Immunity  0.90-0.99        Equivocal     > or = 1.00      Consistent with Immunity  . The presence of rubella IgG antibody  suggests  immunization or past or current infection with rubella virus.    Objective  Body mass index is 29.64 kg/m. Wt Readings from Last 3 Encounters:  05/21/18 141 lb 12.8 oz (64.3 kg)  05/07/18 143 lb 6.4 oz (65 kg)  11/27/17 142 lb 3.2 oz (64.5 kg)   Temp Readings from Last 3 Encounters:  05/21/18 98.8 F (37.1 C) (Oral)  05/07/18 98.8 F (37.1 C) (Oral)  10/25/16 98.8 F (37.1 C) (Oral)   BP Readings from Last 3 Encounters:  05/21/18 124/76  05/07/18 (!) 162/98  11/27/17 (!) 158/88   Pulse Readings from Last 3 Encounters:  05/21/18 76  05/07/18 79  11/27/17 78    Physical Exam  Constitutional: She is oriented to person, place, and time. Vital signs are normal. She appears well-developed and well-nourished. She is cooperative.  HENT:  Head: Normocephalic and atraumatic.  Mouth/Throat: Oropharynx is clear and moist and mucous membranes are normal.  Eyes: Pupils are equal, round, and reactive to light. Conjunctivae are normal.  Cardiovascular: Normal rate, regular rhythm and normal heart sounds.  Pulmonary/Chest: Effort normal and breath sounds normal.  Neurological: She is alert and oriented to person, place, and time. Gait normal.  Skin: Skin is warm, dry and intact.  Psychiatric: She has a normal mood and affect. Her speech is normal and behavior is normal. Judgment and thought content normal. Cognition and memory are normal.  Nursing note and vitals reviewed.   Assessment   1. HTN 2. Palpitations and FH cardiac disease 3. Chronic neck and low back and hip pain  4. Tobacco abuse CT risk score 2.2%  5. Fatty liver 6. HM Plan   1. Reduce lis-hct from 20-25 to 20-12.5 if BP >140/>90 then take lis 10 mg with 20-12.5 lis-hct she has at home  2. Refer to cardiology consider coronary ca score, holter palptiations Dr. Rockey Situ  Ordered US carotid  sch fasting lipid and bmet for #1  3. Xray neck and low back today consider hip in future  4. Refer CT chest low  dose screening for lung cancer  5. twinrix 1/3 given today  6.  Flu shot had 07/1017  Check [facility] Tdap not in there consider Tdap future per pt may have had tdap in 2012 will ask at f/u  Consider shingrix in future twinrix 1/3 today   mammo had 06/20/17 neg due 06/20/18 pending appt as well as DEXA  Colonoscopy [facility] 08/12/15 IH and + collagenous colitis will rec pt f/u with GI for these medications. Also h/o colon polyps   Pap per note had 05/15/14 or 2017? Will do at f/u if needed and physical  LMP age [age]  - need to get copy Dr. Benita Stabile was not faxed with notes  -not currently sexually active   DEXA 12/21/15 osteoporosis/penia + referred today repeat  Referred dermatology [facility] 08/15/18 [facility]  Former smoker quit 2015 total 16 years max 1.5 ppd no FH lung cancer consider CT chest calc risk score 2.2% today will refer  Last eye exam 2019 [facility]  Check A1C with hyperglycemia h/o prediabetes   Obtained records former PCP Dr. Benita Stabile above no labs, no pap will request again. -pt brought in copy reveiwed 11/21/17 labs CMET, lipid and A1C  Glucose 135  A1C 6.2, Cr 0.83 GFR 76, ALT 40 TC 146, TG 158 high, HDL 55, LDL 59   Still no pap obtained.    Provider: Dr. Olivia Mackie McLean-Scoc[facility]

## 2018-05-21 NOTE — Patient Instructions (Addendum)
sch fasting lab 06/2018 end f/u 07/2018 for physical  Take 1 pill of lisinopril/hctz if needed take 10 mg of lisinopril with this if BP>140/>90  Dr. Rockey Situ CT chest  US carotids  Xrays neck and low back today   Palpitations A palpitation is the feeling that your heartbeat is irregular or is faster than normal. It may feel like your heart is fluttering or skipping a beat. Palpitations are usually not a serious problem. They may be caused by many things, including smoking, caffeine, alcohol, stress, and certain medicines. Although most causes of palpitations are not serious, palpitations can be a sign of a serious medical problem. In some cases, you may need further medical evaluation. Follow these instructions at home: Pay attention to any changes in your symptoms. Take these actions to help with your condition:  Avoid the following: ? Caffeinated coffee, tea, soft drinks, diet pills, and energy drinks. ? Chocolate. ? Alcohol.  Do not use any tobacco products, such as cigarettes, chewing tobacco, and e-cigarettes. If you need help quitting, ask your health care provider.  Try to reduce your stress and anxiety. Things that can help you relax include: ? Yoga. ? Meditation. ? Physical activity, such as swimming, jogging, or walking. ? Biofeedback. This is a method that helps you learn to use your mind to control things in your body, such as your heartbeats.  Get plenty of rest and sleep.  Take over-the-counter and prescription medicines only as told by your health care provider.  Keep all follow-up visits as told by your health care provider. This is important.  Contact a health care provider if:  You continue to have a fast or irregular heartbeat after 24 hours.  Your palpitations occur more often. Get help right away if:  You have chest pain or shortness of breath.  You have a severe headache.  You feel dizzy or you faint. This information is not intended to replace advice  given to you by your health care provider. Make sure you discuss any questions you have with your health care provider. Document Released: 09/29/2000 Document Revised: 03/06/2016 Document Reviewed: 06/17/2015 Elsevier Interactive Patient Education  [employer].

## 2018-05-22 ENCOUNTER — Encounter: Payer: Self-pay | Admitting: Internal Medicine

## 2018-05-22 ENCOUNTER — Telehealth: Payer: Self-pay | Admitting: *Deleted

## 2018-05-22 DIAGNOSIS — Z122 Encounter for screening for malignant neoplasm of respiratory organs: Secondary | ICD-10-CM

## 2018-05-22 DIAGNOSIS — Z87891 Personal history of nicotine dependence: Secondary | ICD-10-CM

## 2018-05-22 NOTE — Telephone Encounter (Signed)
Received referral for initial lung cancer screening scan. Contacted patient and obtained smoking history,(former, quit 2015, 52.5 pack year) as well as answering questions related to screening process. Patient denies signs of lung cancer such as weight loss or hemoptysis. Patient denies comorbidity that would prevent curative treatment if lung cancer were found. Patient is scheduled for shared decision making visit and CT scan on 06/07/18.

## 2018-05-22 NOTE — Telephone Encounter (Signed)
Pt notified via my chart of recommendation.

## 2018-05-28 NOTE — Progress Notes (Signed)
mychart message has been sent to inform patient. 

## 2018-05-30 ENCOUNTER — Telehealth: Payer: Self-pay

## 2018-05-30 NOTE — Telephone Encounter (Signed)
Copied from [place] [phone]. Topic: Quick Communication - See Telephone Encounter >> May 30, 2018 11:04 AM Gardiner Ramus wrote: CRM for notification. See Telephone encounter for: 05/30/18. Pt called and stated that she received a call. I don't see any notes in chart. Please advise Cb#[phone]

## 2018-05-31 NOTE — Telephone Encounter (Signed)
I did not call patient. It may have been an old one from when I was trying to get her medical record.

## 2018-06-04 ENCOUNTER — Other Ambulatory Visit: Payer: BLUE CROSS/BLUE SHIELD

## 2018-06-06 ENCOUNTER — Ambulatory Visit
Admission: RE | Admit: 2018-06-06 | Discharge: 2018-06-06 | Disposition: A | Discharge status: Home / Self Care | Payer: BLUE CROSS/BLUE SHIELD | Source: Ambulatory Visit | Attending: Internal Medicine | Admitting: Internal Medicine

## 2018-06-06 DIAGNOSIS — R42 Dizziness and giddiness: Secondary | ICD-10-CM | POA: Diagnosis present

## 2018-06-06 DIAGNOSIS — E785 Hyperlipidemia, unspecified: Secondary | ICD-10-CM

## 2018-06-06 DIAGNOSIS — Z8249 Family history of ischemic heart disease and other diseases of the circulatory system: Secondary | ICD-10-CM | POA: Diagnosis present

## 2018-06-06 DIAGNOSIS — I6523 Occlusion and stenosis of bilateral carotid arteries: Secondary | ICD-10-CM | POA: Insufficient documentation

## 2018-06-07 ENCOUNTER — Encounter: Payer: Self-pay | Admitting: Nurse Practitioner

## 2018-06-07 ENCOUNTER — Inpatient Hospital Stay: Payer: BLUE CROSS/BLUE SHIELD | Attending: Nurse Practitioner | Admitting: Nurse Practitioner

## 2018-06-07 ENCOUNTER — Ambulatory Visit
Admission: RE | Admit: 2018-06-07 | Discharge: 2018-06-07 | Disposition: A | Discharge status: Home / Self Care | Payer: BLUE CROSS/BLUE SHIELD | Source: Ambulatory Visit | Attending: Nurse Practitioner | Admitting: Nurse Practitioner

## 2018-06-07 DIAGNOSIS — I2510 Atherosclerotic heart disease of native coronary artery without angina pectoris: Secondary | ICD-10-CM | POA: Diagnosis not present

## 2018-06-07 DIAGNOSIS — Z87891 Personal history of nicotine dependence: Secondary | ICD-10-CM | POA: Insufficient documentation

## 2018-06-07 DIAGNOSIS — K760 Fatty (change of) liver, not elsewhere classified: Secondary | ICD-10-CM | POA: Diagnosis not present

## 2018-06-07 DIAGNOSIS — I700 Atherosclerosis of aorta: Secondary | ICD-10-CM | POA: Diagnosis not present

## 2018-06-07 DIAGNOSIS — J439 Emphysema, unspecified: Secondary | ICD-10-CM | POA: Diagnosis not present

## 2018-06-07 DIAGNOSIS — Z122 Encounter for screening for malignant neoplasm of respiratory organs: Secondary | ICD-10-CM

## 2018-06-07 NOTE — Progress Notes (Signed)
In accordance with CMS guidelines, patient has met eligibility criteria including age, absence of signs or symptoms of lung cancer.  Social History   Tobacco Use  . Smoking status: Former Smoker    Packs/day: 1.25    Years: 42.00    Pack years: 52.50    Last attempt to quit: 04/03/2014    Years since quitting: 4.1  . Smokeless tobacco: Never Used  Substance Use Topics  . Alcohol use: Yes    Alcohol/week: 0.0 standard drinks  . Drug use: No      A shared decision-making session was conducted prior to the performance of CT scan. This includes one or more decision aids, includes benefits and harms of screening, follow-up diagnostic testing, over-diagnosis, false positive rate, and total radiation exposure.   Counseling on the importance of adherence to annual lung cancer LDCT screening, impact of co-morbidities, and ability or willingness to undergo diagnosis and treatment is imperative for compliance of the program.   Counseling on the importance of continued smoking cessation for former smokers; the importance of smoking cessation for current smokers, and information about tobacco cessation interventions have been given to patient including [place] and 1800 quit [place] programs.   Written order for lung cancer screening with LDCT has been given to the patient and any and all questions have been answered to the best of my abilities.    Yearly follow up will be coordinated by Burgess Estelle, Thoracic Navigator.  Beckey Rutter, DNP, AGNP-C [place] [facility] [phone] (work cell) [phone] (office) 06/07/18 11:34 AM

## 2018-06-10 NOTE — Progress Notes (Signed)
CT chest with mild emphysema/COPD Left sided lung nodules  Mild to moderate fatty liver  Plaque build up in heart consider cardiology referral  Repeat CT in 1 year

## 2018-06-10 NOTE — Progress Notes (Signed)
Mychart message has been sent to inform patient. 

## 2018-06-11 ENCOUNTER — Ambulatory Visit
Admission: RE | Admit: 2018-06-11 | Discharge: 2018-06-11 | Disposition: A | Discharge status: Home / Self Care | Payer: BLUE CROSS/BLUE SHIELD | Source: Ambulatory Visit | Attending: Internal Medicine | Admitting: Internal Medicine

## 2018-06-11 ENCOUNTER — Encounter: Payer: Self-pay | Admitting: *Deleted

## 2018-06-11 ENCOUNTER — Other Ambulatory Visit: Payer: BLUE CROSS/BLUE SHIELD

## 2018-06-11 ENCOUNTER — Telehealth: Payer: Self-pay | Admitting: Internal Medicine

## 2018-06-11 DIAGNOSIS — M810 Age-related osteoporosis without current pathological fracture: Secondary | ICD-10-CM | POA: Insufficient documentation

## 2018-06-11 DIAGNOSIS — Z1231 Encounter for screening mammogram for malignant neoplasm of breast: Secondary | ICD-10-CM | POA: Diagnosis present

## 2018-06-11 DIAGNOSIS — M8580 Other specified disorders of bone density and structure, unspecified site: Secondary | ICD-10-CM

## 2018-06-11 NOTE — Telephone Encounter (Signed)
mychart message has been sent to patient to inform them of lab results.

## 2018-06-11 NOTE — Telephone Encounter (Signed)
CT chest 06/11/18  COPD noted in lungs repeat in 1 year  Plaque build up in heart rec cardiology appt pending I know  Fatty liver +   [place]

## 2018-06-12 ENCOUNTER — Encounter: Payer: Self-pay | Admitting: Internal Medicine

## 2018-06-25 ENCOUNTER — Ambulatory Visit (INDEPENDENT_AMBULATORY_CARE_PROVIDER_SITE_OTHER): Payer: BLUE CROSS/BLUE SHIELD

## 2018-06-25 DIAGNOSIS — Z23 Encounter for immunization: Secondary | ICD-10-CM | POA: Diagnosis not present

## 2018-06-25 NOTE — Progress Notes (Signed)
Patient comes in for Twinrix injection. Injected right deltoid.  Patient tolerated injection well.    

## 2018-07-10 ENCOUNTER — Telehealth: Payer: Self-pay

## 2018-07-10 NOTE — Telephone Encounter (Signed)
Copied from [place] [phone]. Topic: General - Other >> Jul 10, 2018  9:07 AM Mylinda Latina, NT wrote: Patient called and states she is wanting to get a Tetanus shot done at her next appt in Oct. Please call patient to let her know if she can get that shot then CB# [phone]

## 2018-07-11 ENCOUNTER — Encounter: Payer: Self-pay | Admitting: Internal Medicine

## 2018-07-11 ENCOUNTER — Other Ambulatory Visit (INDEPENDENT_AMBULATORY_CARE_PROVIDER_SITE_OTHER): Payer: BLUE CROSS/BLUE SHIELD

## 2018-07-11 DIAGNOSIS — I10 Essential (primary) hypertension: Secondary | ICD-10-CM

## 2018-07-11 DIAGNOSIS — E119 Type 2 diabetes mellitus without complications: Secondary | ICD-10-CM | POA: Insufficient documentation

## 2018-07-11 DIAGNOSIS — R739 Hyperglycemia, unspecified: Secondary | ICD-10-CM | POA: Diagnosis not present

## 2018-07-11 DIAGNOSIS — E785 Hyperlipidemia, unspecified: Secondary | ICD-10-CM | POA: Diagnosis not present

## 2018-07-11 LAB — BASIC METABOLIC PANEL
BUN: 14 mg/dL (ref 6–23)
CO2: 29 mEq/L (ref 19–32)
Calcium: 10.4 mg/dL (ref 8.4–10.5)
Chloride: 97 mEq/L (ref 96–112)
Creatinine, Ser: 1.01 mg/dL (ref 0.40–1.20)
GFR: 58.86 mL/min — AB (ref >60.00)
GLUCOSE: 131 mg/dL — AB (ref 70–99)
Potassium: 4.1 mEq/L (ref 3.5–5.1)
Sodium: 137 mEq/L (ref 135–145)

## 2018-07-11 LAB — LIPID PANEL
CHOLESTEROL: 129 mg/dL (ref 0–200)
HDL: 53.3 mg/dL (ref >39.00)
LDL CALC: 46 mg/dL (ref 0–99)
NonHDL: 75.72
Total CHOL/HDL Ratio: 2
Triglycerides: 149 mg/dL (ref 0.0–149.0)
VLDL: 29.8 mg/dL (ref 0.0–40.0)

## 2018-07-11 LAB — HEMOGLOBIN A1C: Hgb A1c MFr Bld: 6.8 % — ABNORMAL HIGH (ref 4.6–6.5)

## 2018-07-12 ENCOUNTER — Ambulatory Visit (INDEPENDENT_AMBULATORY_CARE_PROVIDER_SITE_OTHER): Payer: BLUE CROSS/BLUE SHIELD

## 2018-07-12 DIAGNOSIS — Z23 Encounter for immunization: Secondary | ICD-10-CM

## 2018-07-26 ENCOUNTER — Encounter: Payer: Self-pay | Admitting: Nurse Practitioner

## 2018-07-26 ENCOUNTER — Telehealth: Payer: Self-pay | Admitting: Nurse Practitioner

## 2018-07-26 NOTE — Telephone Encounter (Signed)
Called patient to follow up regarding concerns she raised via [employer]. Several questions answered. Patient thanked for call.

## 2018-07-30 ENCOUNTER — Ambulatory Visit (INDEPENDENT_AMBULATORY_CARE_PROVIDER_SITE_OTHER): Payer: BLUE CROSS/BLUE SHIELD | Admitting: Internal Medicine

## 2018-07-30 ENCOUNTER — Encounter: Payer: Self-pay | Admitting: Internal Medicine

## 2018-07-30 VITALS — BP 134/80 | HR 84 | Temp 98.9°F | Ht <= 58 in | Wt 144.2 lb

## 2018-07-30 DIAGNOSIS — Z0000 Encounter for general adult medical examination without abnormal findings: Secondary | ICD-10-CM | POA: Diagnosis not present

## 2018-07-30 DIAGNOSIS — Z23 Encounter for immunization: Secondary | ICD-10-CM

## 2018-07-30 DIAGNOSIS — F419 Anxiety disorder, unspecified: Secondary | ICD-10-CM

## 2018-07-30 DIAGNOSIS — E785 Hyperlipidemia, unspecified: Secondary | ICD-10-CM | POA: Diagnosis not present

## 2018-07-30 DIAGNOSIS — M791 Myalgia, unspecified site: Secondary | ICD-10-CM

## 2018-07-30 DIAGNOSIS — K760 Fatty (change of) liver, not elsewhere classified: Secondary | ICD-10-CM

## 2018-07-30 DIAGNOSIS — I10 Essential (primary) hypertension: Secondary | ICD-10-CM

## 2018-07-30 DIAGNOSIS — R002 Palpitations: Secondary | ICD-10-CM

## 2018-07-30 DIAGNOSIS — E119 Type 2 diabetes mellitus without complications: Secondary | ICD-10-CM

## 2018-07-30 DIAGNOSIS — M8580 Other specified disorders of bone density and structure, unspecified site: Secondary | ICD-10-CM

## 2018-07-30 LAB — CK: Total CK: 117 U/L (ref 7–177)

## 2018-07-30 MED ORDER — DIAZEPAM 5 MG PO TABS: 5.0000 mg | ORAL_TABLET | Freq: Two times a day (BID) | ORAL | 2 refills | Status: DC | PRN

## 2018-07-30 MED ORDER — METOPROLOL TARTRATE 25 MG PO TABS: 25.0000 mg | ORAL_TABLET | Freq: Every day | ORAL | 3 refills | Status: DC

## 2018-07-30 MED ORDER — LISINOPRIL-HYDROCHLOROTHIAZIDE 20-12.5 MG PO TABS: 2.0000 | ORAL_TABLET | Freq: Every day | ORAL | 3 refills | Status: DC

## 2018-07-30 MED ORDER — SIMVASTATIN 20 MG PO TABS: 20.0000 mg | ORAL_TABLET | Freq: Every day | ORAL | 3 refills | Status: DC

## 2018-07-30 MED ORDER — DIAZEPAM 5 MG PO TABS: 5.0000 mg | ORAL_TABLET | Freq: Every day | ORAL | 2 refills | Status: DC | PRN

## 2018-07-30 NOTE — Progress Notes (Signed)
Chief Complaint  Patient presents with  . Follow-up   Physical/annual  1. HTN controlled on lopressor 25 bid and zestoretic 20-12.5 2 pills qd she does report episodes of lightheadedness with BP 117/60 at times  2. HLD c/o legs being tired and sore with walking on zocor prev did not tolerate lipitor will check ck today to make sure not related declines hip Xrays for now  3. Reviewed Xrays C and L spine +arthritis degnerative changes  4. C/o palpitations due to see Dr. Rockey Situ soon  5. A1C 6.8 DM 2 new dx wants to try diet and exercise   Review of Systems  Constitutional: Negative for weight loss.  HENT: Negative for hearing loss.   Eyes: Negative for blurred vision.  Respiratory: Negative for shortness of breath.   Cardiovascular: Negative for chest pain.  Musculoskeletal: Positive for back pain, myalgias and neck pain.  Skin: Negative for rash.  Neurological: Negative for headaches.  Psychiatric/Behavioral: Negative for depression.   Past Medical History:  Diagnosis Date  . Allergy   . Anxiety   . Chicken pox   . Collagenous colitis    Dr. Allen Norris   . Colon polyps   . DDD (degenerative disc disease), cervical    had PT in the past, noted imaging 05/31/10   . Depression   . Fatty liver   . Ganglion cyst of wrist, left   . Hemorrhoids   . Hemorrhoids   . History of colon polyps   . History of prediabetes   . Hyperlipidemia 07/06/2015  . Hypertension   . IBS (irritable bowel syndrome) 07/06/2015  . NS (neck stiffness)    xray shows something at "C3" approx 4 yrs ago. Had PT. Helped.  . Polycythemia, secondary 07/06/2015  . Prediabetes   . Urinary incontinence   . Vitamin D deficiency 07/06/2015   Past Surgical History:  Procedure Laterality Date  . COLONOSCOPY  2006  . COLONOSCOPY WITH PROPOFOL N/A 08/12/2015   Procedure: COLONOSCOPY WITH PROPOFOL;  Surgeon: [name], MD;  Location: [facility];  Service: Endoscopy;  Laterality: N/A;  PT WOULD LIKE EARLY AM PER JS   . HYSTEROSALPINGOGRAM     1983  . TONSILLECTOMY AND ADENOIDECTOMY     age [age]   Family History  Problem Relation Age of Onset  . Breast cancer Maternal Grandmother 60  . Cancer Maternal Grandmother        breast  . Hypertension Father   . Hyperlipidemia Father   . COPD Father   . Hearing loss Father   . Heart disease Father   . Diabetes Mother   . Hyperlipidemia Mother   . Stroke Mother   . Thyroid disease Daughter   . Heart disease Maternal Grandfather   . Heart disease Paternal Grandfather   . Osteoporosis Other    Social History   Socioeconomic History  . Marital status: Married    Spouse name: Not on file  . Number of children: Not on file  . Years of education: Not on file  . Highest education level: Not on file  Occupational History  . Not on file  Social Needs  . Financial resource strain: Not on file  . Food insecurity:    Worry: Not on file    Inability: Not on file  . Transportation needs:    Medical: Not on file    Non-medical: Not on file  Tobacco Use  . Smoking status: Former Smoker    Packs/day: 1.25  Years: 42.00    Pack years: 52.50    Last attempt to quit: 04/03/2014    Years since quitting: 4.3  . Smokeless tobacco: Never Used  Substance and Sexual Activity  . Alcohol use: Yes    Alcohol/week: 0.0 standard drinks  . Drug use: No  . Sexual activity: Not Currently  Lifestyle  . Physical activity:    Days per week: Not on file    Minutes per session: Not on file  . Stress: Not on file  Relationships  . Social connections:    Talks on phone: Not on file    Gets together: Not on file    Attends religious service: Not on file    Active member of club or organization: Not on file    Attends meetings of clubs or organizations: Not on file    Relationship status: Not on file  . Intimate partner violence:    Fear of current or ex partner: Not on file    Emotionally abused: Not on file    Physically abused: Not on file    Forced sexual  activity: Not on file  Other Topics Concern  . Not on file  Social History Narrative   Married    Some college, retired    2 daughters    No guns    Wears seat belt    Safe in relationship    Current Meds  Medication Sig  . aspirin 81 MG tablet Take 81 mg by mouth every other day.   . budesonide (ENTOCORT EC) 3 MG 24 hr capsule Take 3 capsules (9 mg total) by mouth every morning.  . calcium carbonate (CALCIUM 600) 600 MG TABS tablet Calcium 600 + D(3) 600 mg (1,500 mg)-400 unit tablet  . Cholecalciferol (VITAMIN D) 2000 units CAPS Vitamin D3 2,000 unit capsule   1 capsule every day by oral route.  . diazepam (VALIUM) 5 MG tablet Take 5 mg by mouth daily as needed for anxiety.   . diphenhydrAMINE (BENADRYL) 50 MG tablet Take 50 mg by mouth at bedtime as needed for sleep.   Marland Kitchen lisinopril-hydrochlorothiazide (ZESTORETIC) 20-12.5 MG tablet Take 2 tablets by mouth daily.  . metoprolol tartrate (LOPRESSOR) 25 MG tablet Take 25 mg by mouth daily.   . Multiple Vitamin (MULTIVITAMIN) tablet Take 1 tablet by mouth every other day.   . Multiple Vitamins-Minerals (MULTIVITAMIN ADULT PO) Complete Multivitamin  . simvastatin (ZOCOR) 20 MG tablet Take 1 tablet (20 mg total) by mouth daily at 6 PM.   Allergies  Allergen Reactions  . Lactase Diarrhea  . Lipitor [Atorvastatin] Swelling    Swelling and leg pain  . Neomycin Other (See Comments)    Nausea    . Pollen Extract Other (See Comments)  . Levaquin [Levofloxacin In D5w] Nausea And Vomiting    Lightheadedness    Recent Results (from the past 2160 hour(s))  CBC with Differential/Platelet     Status: None   Collection Time: 05/07/18 11:42 AM  Result Value Ref Range   WBC 8.4 4.0 - 10.5 K/uL   RBC 4.15 3.87 - 5.11 Mil/uL   Hemoglobin 13.7 12.0 - 15.0 g/dL   HCT 40.6 36.0 - 46.0 %   MCV 97.8 78.0 - 100.0 fl   MCHC 33.8 30.0 - 36.0 g/dL   RDW 13.6 11.5 - 15.5 %   Platelets 305.0 150.0 - 400.0 K/uL   Neutrophils Relative % 64.4 43.0 -  77.0 %   Lymphocytes Relative 25.4 12.0 - 46.0 %  Monocytes Relative 7.8 3.0 - 12.0 %   Eosinophils Relative 1.3 0.0 - 5.0 %   Basophils Relative 1.1 0.0 - 3.0 %   Neutro Abs 5.4 1.4 - 7.7 K/uL   Lymphs Abs 2.1 0.7 - 4.0 K/uL   Monocytes Absolute 0.7 0.1 - 1.0 K/uL   Eosinophils Absolute 0.1 0.0 - 0.7 K/uL   Basophils Absolute 0.1 0.0 - 0.1 K/uL  TSH     Status: None   Collection Time: 05/07/18 11:42 AM  Result Value Ref Range   TSH 1.79 0.35 - 4.50 uIU/mL  T4, free     Status: None   Collection Time: 05/07/18 11:42 AM  Result Value Ref Range   Free T4 1.21 0.60 - 1.60 ng/dL    Comment: Specimens from patients who are undergoing biotin therapy and /or ingesting biotin supplements may contain high levels of biotin.  The higher biotin concentration in these specimens interferes with this Free T4 assay.  Specimens that contain high levels  of biotin may cause false high results for this Free T4 assay.  Please interpret results in light of the total clinical presentation of the patient.    Vitamin D (25 hydroxy)     Status: None   Collection Time: 05/07/18 11:42 AM  Result Value Ref Range   VITD 47.83 30.00 - 100.00 ng/mL  COMPLETE METABOLIC PANEL WITH GFR     Status: Abnormal   Collection Time: 05/07/18 11:43 AM  Result Value Ref Range   Glucose, Bld 105 (H) 65 - 99 mg/dL    Comment: .            Fasting reference interval . For someone without known diabetes, a glucose value between 100 and 125 mg/dL is consistent with prediabetes and should be confirmed with a follow-up test. .    BUN 12 7 - 25 mg/dL   Creat 0.84 0.50 - 0.99 mg/dL    Comment: For patients >31 years of age, the reference limit for Creatinine is approximately 13% higher for people identified as African-American. .    GFR, Est Non African American 74 > OR = 60 mL/min/1.28m2   GFR, Est African American 86 > OR = 60 mL/min/1.64m2   BUN/Creatinine Ratio NOT APPLICABLE 6 - 22 (calc)   Sodium 139 135 - 146  mmol/L   Potassium 4.3 3.5 - 5.3 mmol/L   Chloride 99 98 - 110 mmol/L   CO2 30 20 - 32 mmol/L   Calcium 9.8 8.6 - 10.4 mg/dL   Total Protein 7.2 6.1 - 8.1 g/dL   Albumin 4.7 3.6 - 5.1 g/dL   Globulin 2.5 1.9 - 3.7 g/dL (calc)   AG Ratio 1.9 1.0 - 2.5 (calc)   Total Bilirubin 0.6 0.2 - 1.2 mg/dL   Alkaline phosphatase (APISO) 60 33 - 130 U/L   AST 25 10 - 35 U/L   ALT 27 6 - 29 U/L  Urinalysis, Routine w reflex microscopic     Status: None   Collection Time: 05/07/18 11:43 AM  Result Value Ref Range   Specific Gravity, UA 1.005 1.005 - 1.030   pH, UA 5.5 5.0 - 7.5   Color, UA Yellow Yellow   Appearance Ur Clear Clear   Leukocytes, UA Negative Negative   Protein, UA Negative Negative/Trace   Glucose, UA Negative Negative   Ketones, UA Negative Negative   RBC, UA Negative Negative   Bilirubin, UA Negative Negative   Urobilinogen, Ur 0.2 0.2 - 1.0 mg/dL  Nitrite, UA Negative Negative   Microscopic Examination Comment     Comment: Microscopic not indicated and not performed.  Urine Culture     Status: None   Collection Time: 05/07/18 11:43 AM  Result Value Ref Range   Urine Culture, Routine Final report    Organism ID, Bacteria No growth   Hepatitis B surface antibody     Status: Abnormal   Collection Time: 05/07/18 11:52 AM  Result Value Ref Range   Hepatitis B-Post <5 (L) > OR = 10 mIU/mL    Comment: . Patient does not have immunity to hepatitis B virus. . For additional information, please refer to http://education.[facility].com/faq/FAQ105 (This link is being provided for informational/ educational purposes only).   Hepatitis C antibody     Status: None   Collection Time: 05/07/18 11:52 AM  Result Value Ref Range   Hepatitis C Ab NON-REACTIVE NON-REACTI   SIGNAL TO CUT-OFF 0.01 <1.00    Comment: . HCV antibody was non-reactive. There is no laboratory  evidence of HCV infection. . In most cases, no further action is required. However, if recent HCV  exposure is suspected, a test for HCV RNA (test code [phone]) is suggested. . For additional information please refer to http://education.[facility].com/faq/FAQ22v1 (This link is being provided for informational/ educational purposes only.) .   Hepatitis B surface antigen     Status: None   Collection Time: 05/07/18 11:52 AM  Result Value Ref Range   Hepatitis B Surface Ag NON-REACTIVE NON-REACTI  Hepatitis A Ab, Total     Status: None   Collection Time: 05/07/18 11:52 AM  Result Value Ref Range   Hepatitis A AB,Total NON-REACTIVE NON-REACTI  Measles/Mumps/Rubella Immunity     Status: None   Collection Time: 05/07/18 11:52 AM  Result Value Ref Range   Rubeola IgG >300.00 AU/mL    Comment: AU/mL            Interpretation -----            -------------- <25.00           Negative 25.00-29.99      Equivocal >29.99           Positive . A positive result indicates that the patient has antibody to measles virus. It does not differentiate  between an active or past infection. The clinical  diagnosis must be interpreted in conjunction with  clinical signs and symptoms of the patient.    Mumps IgG 64.90 AU/mL    Comment:  AU/mL           Interpretation -------         ---------------- <9.00             Negative 9.00-10.99        Equivocal >10.99            Positive A positive result indicates that the patient has  antibody to mumps virus. It does not differentiate between an  active or past infection. The clinical diagnosis must be interpreted in conjunction with clinical signs and symptoms of the patient. .    Rubella 28.50 index    Comment:     Index            Interpretation     -----            --------------       <0.90            Not consistent with Immunity     0.90-0.99  Equivocal     > or = 1.00      Consistent with Immunity  . The presence of rubella IgG antibody suggests  immunization or past or current infection with rubella virus.   Hemoglobin A1c      Status: Abnormal   Collection Time: 07/11/18  8:43 AM  Result Value Ref Range   Hgb A1c MFr Bld 6.8 (H) 4.6 - 6.5 %    Comment: Glycemic Control Guidelines for People with Diabetes:Non Diabetic:  <6%Goal of Therapy: <7%Additional Action Suggested:  >8%   Lipid panel     Status: None   Collection Time: 07/11/18  8:43 AM  Result Value Ref Range   Cholesterol 129 0 - 200 mg/dL    Comment: ATP III Classification       Desirable:  < 200 mg/dL               Borderline High:  200 - 239 mg/dL          High:  > = 240 mg/dL   Triglycerides 149.0 0.0 - 149.0 mg/dL    Comment: Normal:  <150 mg/dLBorderline High:  150 - 199 mg/dL   HDL 53.30 >39.00 mg/dL   VLDL 29.8 0.0 - 40.0 mg/dL   LDL Cholesterol 46 0 - 99 mg/dL   Total CHOL/HDL Ratio 2     Comment:                Men          Women1/2 Average Risk     3.4          3.3Average Risk          5.0          4.42X Average Risk          9.6          7.13X Average Risk          15.0          11.0                       NonHDL 75.72     Comment: NOTE:  Non-HDL goal should be 30 mg/dL higher than patient's LDL goal (i.e. LDL goal of < 70 mg/dL, would have non-HDL goal of < 100 mg/dL)  Basic Metabolic Panel (BMET)     Status: Abnormal   Collection Time: 07/11/18  8:43 AM  Result Value Ref Range   Sodium 137 135 - 145 mEq/L   Potassium 4.1 3.5 - 5.1 mEq/L   Chloride 97 96 - 112 mEq/L   CO2 29 19 - 32 mEq/L   Glucose, Bld 131 (H) 70 - 99 mg/dL   BUN 14 6 - 23 mg/dL   Creatinine, Ser 1.01 0.40 - 1.20 mg/dL   Calcium 10.4 8.4 - 10.5 mg/dL   GFR 58.86 (L) >60.00 mL/min   Objective  Body mass index is 30.14 kg/m. Wt Readings from Last 3 Encounters:  07/30/18 144 lb 3.2 oz (65.4 kg)  06/07/18 141 lb (64 kg)  05/21/18 141 lb 12.8 oz (64.3 kg)   Temp Readings from Last 3 Encounters:  07/30/18 98.9 F (37.2 C) (Oral)  05/21/18 98.8 F (37.1 C) (Oral)  05/07/18 98.8 F (37.1 C) (Oral)   BP Readings from Last 3 Encounters:  07/30/18 134/80   05/21/18 124/76  05/07/18 (!) 162/98   Pulse Readings from Last 3 Encounters:  07/30/18 84  05/21/18 76  05/07/18 79  Physical Exam  Constitutional: She is oriented to person, place, and time. Vital signs are normal. She appears well-developed and well-nourished. She is cooperative.  HENT:  Head: Normocephalic and atraumatic.  Mouth/Throat: Oropharynx is clear and moist and mucous membranes are normal.  Eyes: Pupils are equal, round, and reactive to light. Conjunctivae are normal.  Cardiovascular: Normal rate, regular rhythm and normal heart sounds.  Pulmonary/Chest: Effort normal and breath sounds normal. Chest wall is not dull to percussion. She exhibits no mass, no tenderness, no bony tenderness, no laceration, no crepitus, no edema, no deformity, no swelling and no retraction. Right breast exhibits no inverted nipple, no mass, no nipple discharge, no skin change and no tenderness. Left breast exhibits no inverted nipple, no mass, no nipple discharge, no skin change and no tenderness. No breast swelling, tenderness, discharge or bleeding. Breasts are symmetrical.  Neurological: She is alert and oriented to person, place, and time. Gait normal.  Skin: Skin is warm, dry and intact.  Psychiatric: She has a normal mood and affect. Her speech is normal and behavior is normal. Judgment and thought content normal. Cognition and memory are normal.  Nursing note and vitals reviewed.   Assessment   1. HTN/HLD 2. Arthritis neck and low back  3. Palpitations  4. DM 2 A1c 6.8 h/o gestastional DM 5. HM/annual today  Plan   1. Cont meds  2. Monitor  3. F/u Dr. Rockey Situ  4. Repeat A1C in 6 months try diet and exercise  Consider januvia in future  5.  Flu shot had 07/12/18  Tdap today  Consider shingrix in future twinrix 2/3 due 11/21/18  mammo 06/11/18 neg  DEXA 06/11/18 osteopenia  Colonoscopy [facility] 08/12/15 IH and + collagenous colitiswill rec pt f/u with GI for these medications. Also  h/o colon polyps  Pap 05/22/16 due in 3 years  Referred dermatology [facility] 08/15/18 [facility]  Former smoker quit 2015 total 16 years max 1.5 ppd no FH lung cancer  -CT chest had 05/2018  Last eye exam 2019 [facility] rec healthy diet and exercise  Check CK due to muscle aches on statin   Provider: Dr. Olivia Mackie McLean-Scocuzza-Internal Medicine

## 2018-07-30 NOTE — Progress Notes (Signed)
Pre visit review using our clinic review tool, if applicable. No additional management support is needed unless otherwise documented below in the visit note. 

## 2018-07-30 NOTE — Patient Instructions (Addendum)
Take vitamin D3 1000 IU daily calcium 600 mg 2x per day  Try Zevia soda sugar free instead of soda   Diabetes Mellitus and Nutrition When you have diabetes (diabetes mellitus), it is very important to have healthy eating habits because your blood sugar (glucose) levels are greatly affected by what you eat and drink. Eating healthy foods in the appropriate amounts, at about the same times every day, can help you:  Control your blood glucose.  Lower your risk of heart disease.  Improve your blood pressure.  Reach or maintain a healthy weight.  Every person with diabetes is different, and each person has different needs for a meal plan. Your health care provider may recommend that you work with a diet and nutrition specialist (dietitian) to make a meal plan that is best for you. Your meal plan may vary depending on factors such as:  The calories you need.  The medicines you take.  Your weight.  Your blood glucose, blood pressure, and cholesterol levels.  Your activity level.  Other health conditions you have, such as heart or kidney disease.  How do carbohydrates affect me? Carbohydrates affect your blood glucose level more than any other type of food. Eating carbohydrates naturally increases the amount of glucose in your blood. Carbohydrate counting is a method for keeping track of how many carbohydrates you eat. Counting carbohydrates is important to keep your blood glucose at a healthy level, especially if you use insulin or take certain oral diabetes medicines. It is important to know how many carbohydrates you can safely have in each meal. This is different for every person. Your dietitian can help you calculate how many carbohydrates you should have at each meal and for snack. Foods that contain carbohydrates include:  Bread, cereal, rice, pasta, and crackers.  Potatoes and corn.  Peas, beans, and lentils.  Milk and yogurt.  Fruit and juice.  Desserts, such as cakes,  cookies, ice cream, and candy.  How does alcohol affect me? Alcohol can cause a sudden decrease in blood glucose (hypoglycemia), especially if you use insulin or take certain oral diabetes medicines. Hypoglycemia can be a life-threatening condition. Symptoms of hypoglycemia (sleepiness, dizziness, and confusion) are similar to symptoms of having too much alcohol. If your health care provider says that alcohol is safe for you, follow these guidelines:  Limit alcohol intake to no more than 1 drink per day for nonpregnant women and 2 drinks per day for men. One drink equals 12 oz of beer, 5 oz of wine, or 1 oz of hard liquor.  Do not drink on an empty stomach.  Keep yourself hydrated with water, diet soda, or unsweetened iced tea.  Keep in mind that regular soda, juice, and other mixers may contain a lot of sugar and must be counted as carbohydrates.  What are tips for following this plan? Reading food labels  Start by checking the serving size on the label. The amount of calories, carbohydrates, fats, and other nutrients listed on the label are based on one serving of the food. Many foods contain more than one serving per package.  Check the total grams (g) of carbohydrates in one serving. You can calculate the number of servings of carbohydrates in one serving by dividing the total carbohydrates by 15. For example, if a food has 30 g of total carbohydrates, it would be equal to 2 servings of carbohydrates.  Check the number of grams (g) of saturated and trans fats in one serving. Choose foods  that have low or no amount of these fats.  Check the number of milligrams (mg) of sodium in one serving. Most people should limit total sodium intake to less than 2,300 mg per day.  Always check the nutrition information of foods labeled as "low-fat" or "nonfat". These foods may be higher in added sugar or refined carbohydrates and should be avoided.  Talk to your dietitian to identify your daily  goals for nutrients listed on the label. Shopping  Avoid buying canned, premade, or processed foods. These foods tend to be high in fat, sodium, and added sugar.  Shop around the outside edge of the grocery store. This includes fresh fruits and vegetables, bulk grains, fresh meats, and fresh dairy. Cooking  Use low-heat cooking methods, such as baking, instead of high-heat cooking methods like deep frying.  Cook using healthy oils, such as olive, canola, or sunflower oil.  Avoid cooking with butter, cream, or high-fat meats. Meal planning  Eat meals and snacks regularly, preferably at the same times every day. Avoid going long periods of time without eating.  Eat foods high in fiber, such as fresh fruits, vegetables, beans, and whole grains. Talk to your dietitian about how many servings of carbohydrates you can eat at each meal.  Eat 4-6 ounces of lean protein each day, such as lean meat, chicken, fish, eggs, or tofu. 1 ounce is equal to 1 ounce of meat, chicken, or fish, 1 egg, or 1/4 cup of tofu.  Eat some foods each day that contain healthy fats, such as avocado, nuts, seeds, and fish. Lifestyle   Check your blood glucose regularly.  Exercise at least 30 minutes 5 or more days each week, or as told by your health care provider.  Take medicines as told by your health care provider.  Do not use any products that contain nicotine or tobacco, such as cigarettes and e-cigarettes. If you need help quitting, ask your health care provider.  Work with a Social worker or diabetes educator to identify strategies to manage stress and any emotional and social challenges. What are some questions to ask my health care provider?  Do I need to meet with a diabetes educator?  Do I need to meet with a dietitian?  What number can I call if I have questions?  When are the best times to check my blood glucose? Where to find more information:  American Diabetes Association:  diabetes.org/food-and-fitness/food  Academy of Nutrition and Dietetics: PokerClues.dk  [employer] of Diabetes and Digestive and Kidney Diseases (NIH): ContactWire.be Summary  A healthy meal plan will help you control your blood glucose and maintain a healthy lifestyle.  Working with a diet and nutrition specialist (dietitian) can help you make a meal plan that is best for you.  Keep in mind that carbohydrates and alcohol have immediate effects on your blood glucose levels. It is important to count carbohydrates and to use alcohol carefully. This information is not intended to replace advice given to you by your health care provider. Make sure you discuss any questions you have with your health care provider. Document Released: 06/29/2005 Document Revised: 11/06/2016 Document Reviewed: 11/06/2016 Elsevier Interactive Patient Education  [employer].

## 2018-07-31 ENCOUNTER — Encounter: Payer: Self-pay | Admitting: Nurse Practitioner

## 2018-08-05 DIAGNOSIS — I2510 Atherosclerotic heart disease of native coronary artery without angina pectoris: Secondary | ICD-10-CM | POA: Insufficient documentation

## 2018-08-05 DIAGNOSIS — I700 Atherosclerosis of aorta: Secondary | ICD-10-CM | POA: Insufficient documentation

## 2018-08-05 NOTE — Progress Notes (Signed)
Cardiology Office Note  Date:  08/06/2018   ID:  Vanessa Herman, DOB 04-02-1956, MRN [id number]  PCP:  McLean-Scocuzza, Nino Glow, MD   Chief Complaint  Patient presents with  . other    CAD/dizziness/palpitations. Meds reviewed verbally with pt.    HPI:  Vanessa Herman is a 62 year old woman with past medical history of Ove Coronary calcification on CT scan Aortic atherosclerosis Diabetes type 2 Hypertension Smoking history obesity Referred by Dr.McLean-Scocuzza, Vanessa Herman   For  consultation of her palpitations and family history of coronary disease  She has numerous issues to discuss today FH dad and both grandparents heart dz,  h/o palpitations  When waking up in the AM, can feel heart beating Drinks diet Dr. Malachi Bonds in AM Wine in the PM  Different times of the day, gets dizzy Pressures  Lisinopril 40, got dizzy, worse on HCTZ Now on lisinopril 20 daily  Pressure numbers reviewed, In Aug 559 to 741 systolic  Sedentary Early 2019 was sweating a lot Start on HCTZ Dizziness worse  Prior CT scan August 2019 Showing coronary calcification, aortic atherosclerosis  Prior carotid ultrasound August 2019 Minor carotid atherosclerosis less than 50% bilaterally  Total cholesterol 129 on simvastatin LDL 46 Hemoglobin A1c 6.8  Mild orthostasis today: 138/83, rate 74: supine 129/85, rate 79 lightheaded , standing 126/84 rate 84, after 3 min, lightheaded  If walks too fast, gets leg pain in hips  EKG personally reviewed by myself on todays visit Shows normal sinus rhythm with rate 81 bpm no significant ST or T wave changes  PMH:   has a past medical history of Allergy, Anxiety, Chicken pox, Collagenous colitis, Colon polyps, DDD (degenerative disc disease), cervical, Depression, Fatty liver, Ganglion cyst of wrist, left, Hemorrhoids, Hemorrhoids, History of colon polyps, History of prediabetes, Hyperlipidemia (07/06/2015), Hypertension, IBS (irritable bowel syndrome)  (07/06/2015), NS (neck stiffness), Polycythemia, secondary (07/06/2015), Prediabetes, Urinary incontinence, and Vitamin D deficiency (07/06/2015).  PSH:    Past Surgical History:  Procedure Laterality Date  . COLONOSCOPY  2006  . COLONOSCOPY WITH PROPOFOL N/A 08/12/2015   Procedure: COLONOSCOPY WITH PROPOFOL;  Surgeon: [name], MD;  Location: [facility];  Service: Endoscopy;  Laterality: N/A;  PT WOULD LIKE EARLY AM PER JS  . HYSTEROSALPINGOGRAM     1983  . TONSILLECTOMY AND ADENOIDECTOMY     age [age]    Current Outpatient Medications  Medication Sig Dispense Refill  . aspirin 81 MG tablet Take 81 mg by mouth every other day.     . budesonide (ENTOCORT EC) 3 MG 24 hr capsule Take 3 capsules (9 mg total) by mouth every morning. (Patient taking differently: Take 3 mg by mouth daily. ) 270 capsule 3  . calcium carbonate (CALCIUM 600) 600 MG TABS tablet Calcium 600 + D(3) 600 mg (1,500 mg)-400 unit tablet    . Cholecalciferol (VITAMIN D) 2000 units CAPS Vitamin D3 2,000 unit capsule   1 capsule every day by oral route.    . diazepam (VALIUM) 5 MG tablet Take 1 tablet (5 mg total) by mouth 2 (two) times daily as needed for anxiety. 60 tablet 2  . diphenhydrAMINE (BENADRYL) 50 MG tablet Take 50 mg by mouth at bedtime as needed for sleep.     Marland Kitchen lisinopril-hydrochlorothiazide (ZESTORETIC) 20-12.5 MG tablet Take 2 tablets by mouth daily. (Patient taking differently: Take 1 tablet by mouth daily. ) 180 tablet 3  . metoprolol tartrate (LOPRESSOR) 25 MG tablet Take 1 tablet (25 mg total) by  mouth daily. 180 tablet 3  . Multiple Vitamin (MULTIVITAMIN) tablet Take 1 tablet by mouth every other day.     . Multiple Vitamins-Minerals (MULTIVITAMIN ADULT PO) Complete Multivitamin    . simvastatin (ZOCOR) 20 MG tablet Take 1 tablet (20 mg total) by mouth daily at 6 PM. 90 tablet 3   No current facility-administered medications for this visit.      Allergies:   Lactase; Lipitor [atorvastatin];  Neomycin; Pollen extract; and Levaquin [levofloxacin in d5w]   Social History:  The patient  reports that she quit smoking about 4 years ago. She has a 52.50 pack-year smoking history. She has never used smokeless tobacco. She reports that she drinks alcohol. She reports that she does not use drugs.   Family History:   family history includes Breast cancer (age of onset: 81) in her maternal grandmother; COPD in her father; Cancer in her maternal grandmother; Diabetes in her mother; Hearing loss in her father; Heart attack in her father, maternal grandfather, and paternal grandfather; Heart disease in her father, maternal grandfather, paternal grandfather, and paternal grandmother; Hyperlipidemia in her father and mother; Hypertension in her father; Osteoporosis in her other; Stroke in her mother; Thyroid disease in her daughter.    Review of Systems: Review of Systems  Constitutional: Negative.   Respiratory: Negative.   Cardiovascular: Positive for palpitations.  Gastrointestinal: Negative.   Musculoskeletal: Negative.   Neurological: Positive for dizziness.  Psychiatric/Behavioral: Negative.   All other systems reviewed and are negative.    PHYSICAL EXAM: VS:  BP 139/86 (BP Location: Right Arm, Patient Position: Sitting, Cuff Size: Normal)   Pulse 81   Ht 4\' 10"  (1.473 m)   Wt 144 lb 12 oz (65.7 kg)   BMI 30.25 kg/m  , BMI Body mass index is 30.25 kg/m. GEN: Well nourished, well developed, in no acute distress , obese HEENT: normal  Neck: no JVD, carotid bruits, or masses Cardiac: RRR; no murmurs, rubs, or gallops,no edema  Respiratory:  clear to auscultation bilaterally, normal work of breathing GI: soft, nontender, nondistended, + BS MS: no deformity or atrophy  Skin: warm and dry, no rash Neuro:  Strength and sensation are intact Psych: euthymic mood, full affect   Recent Labs: 05/07/2018: ALT 27; Hemoglobin 13.7; Platelets 305.0; TSH 1.79 07/11/2018: BUN 14; Creatinine,  Ser 1.01; Potassium 4.1; Sodium 137    Lipid Panel Lab Results  Component Value Date   CHOL 129 07/11/2018   HDL 53.30 07/11/2018   LDLCALC 46 07/11/2018   TRIG 149.0 07/11/2018      Wt Readings from Last 3 Encounters:  08/06/18 144 lb 12 oz (65.7 kg)  07/30/18 144 lb 3.2 oz (65.4 kg)  06/07/18 141 lb (64 kg)       ASSESSMENT AND PLAN:  Aortic atherosclerosis (HCC) Mild in the arch,  Stopped smoking 5 yrs ago Lipids at goal No further work-up needed  Coronary artery calcification seen on CAT scan - Plan: EKG 12-Lead mild disease, LAD and LCX Aggressive diabetes control recommended  denies any anginal symptoms  Essential hypertension Does not seem to tolerate low blood pressure, has dizziness, orthostasis symptoms She is mildly orthostatic today and dizzy with systolic pressures in the 120 range Recommend she take lisinopril 20 in the morning, take HCTZ 12.5 daily as needed for systolic pressure greater than 140 Suggested she take metoprolol late in the evening for her tachycardia palpitations in the morning when she first wakes up  Controlled type 2 diabetes mellitus without  complication, without long-term current use of insulin ([place]) We have encouraged continued exercise, careful diet management in an effort to lose weight.  Dietary guide provided Significant weight gain since she stopped smoking 5 years ago  Palpitations Orthostatic today Metoprolol before bed If symptoms get worse could have her wear a monitor, this was discussed with her in detail  Nonspecific chest pain Atypical, no further workup at this time  Mixed hyperlipidemia Cholesterol is at goal on the current lipid regimen. No changes to the medications were made.  Disposition:   F/U as needed   Total encounter time more than 60 minutes  Greater than 50% was spent in counseling and coordination of care with the patient  Patient was seen in consultation for Dr. Olivia Herman McLean-Scocuzza and will  be referred back to her office for ongoing care of the issues detailed above  Orders Placed This Encounter  Procedures  . EKG 12-Lead     Signed, Esmond Plants, M.D., Ph.D. 08/06/2018  [place], [place]

## 2018-08-06 ENCOUNTER — Ambulatory Visit: Payer: BLUE CROSS/BLUE SHIELD | Admitting: Cardiovascular Disease

## 2018-08-06 ENCOUNTER — Encounter: Payer: Self-pay | Admitting: Cardiovascular Disease

## 2018-08-06 VITALS — BP 139/86 | HR 81 | Ht <= 58 in | Wt 144.8 lb

## 2018-08-06 DIAGNOSIS — E119 Type 2 diabetes mellitus without complications: Secondary | ICD-10-CM

## 2018-08-06 DIAGNOSIS — I10 Essential (primary) hypertension: Secondary | ICD-10-CM

## 2018-08-06 DIAGNOSIS — E782 Mixed hyperlipidemia: Secondary | ICD-10-CM

## 2018-08-06 DIAGNOSIS — I2510 Atherosclerotic heart disease of native coronary artery without angina pectoris: Secondary | ICD-10-CM | POA: Diagnosis not present

## 2018-08-06 DIAGNOSIS — R002 Palpitations: Secondary | ICD-10-CM

## 2018-08-06 DIAGNOSIS — I700 Atherosclerosis of aorta: Secondary | ICD-10-CM

## 2018-08-06 DIAGNOSIS — R079 Chest pain, unspecified: Secondary | ICD-10-CM

## 2018-08-06 MED ORDER — LISINOPRIL 20 MG PO TABS: 20.0000 mg | ORAL_TABLET | Freq: Every day | ORAL | 1 refills | Status: DC

## 2018-08-06 MED ORDER — HYDROCHLOROTHIAZIDE 12.5 MG PO CAPS: 12.5000 mg | ORAL_CAPSULE | Freq: Every day | ORAL | 1 refills | Status: DC

## 2018-08-06 NOTE — Patient Instructions (Signed)
Medication Instructions:   Consider taking lisnopril 20 mg in the AM Take HCTZ 12.5 mg as needed for high pressure, >140 Take the metoprolol right before bed  Labwork:  No new labs needed  Testing/Procedures:  No further testing at this time   Follow-Up: It was a pleasure seeing you in the office today. Please call us if you have new issues that need to be addressed before your next appt.  [phone]  Your physician wants you to follow-up in: 12 months.  You will receive a reminder letter in the mail two months in advance. If you don't receive a letter, please call our office to schedule the follow-up appointment.  If you need a refill on your cardiac medications before your next appointment, please call your pharmacy.  For educational health videos Log in to : [email or web address] Or : [email or web address], password : [email or web address]

## 2018-08-29 ENCOUNTER — Encounter: Payer: Self-pay | Admitting: Family Medicine

## 2018-08-29 ENCOUNTER — Ambulatory Visit: Payer: BLUE CROSS/BLUE SHIELD | Admitting: Family Medicine

## 2018-08-29 VITALS — BP 130/84 | HR 88 | Temp 99.3°F | Ht <= 58 in | Wt 143.0 lb

## 2018-08-29 DIAGNOSIS — J3489 Other specified disorders of nose and nasal sinuses: Secondary | ICD-10-CM | POA: Diagnosis not present

## 2018-08-29 DIAGNOSIS — R0982 Postnasal drip: Secondary | ICD-10-CM

## 2018-08-29 DIAGNOSIS — J019 Acute sinusitis, unspecified: Secondary | ICD-10-CM

## 2018-08-29 MED ORDER — AMOXICILLIN 875 MG PO TABS: 875.0000 mg | ORAL_TABLET | Freq: Two times a day (BID) | ORAL | 0 refills | Status: DC

## 2018-08-29 NOTE — Progress Notes (Signed)
Subjective:    Patient ID: Vanessa Herman, female    DOB: August 14, 1956, 62 y.o.   MRN: [id number]  HPI  Presents to clinic due to suspected sinus infection.  Has had sinus pain/pressure around eyes & left side of face above teeth, thick nasal drainage, feeling fatigued with heavy head for 10 days.  Has tried sudafed with minimal effect in symptom relief.  No fever or chills  Patient Active Problem List   Diagnosis Date Noted  . Aortic atherosclerosis ([place]) 08/05/2018  . Coronary artery calcification seen on CAT scan 08/05/2018  . Annual physical exam 07/30/2018  . Diabetes mellitus type 2, controlled ([place]) 07/11/2018  . Palpitations 05/21/2018  . Cervicalgia 05/21/2018  . Chronic bilateral low back pain without sciatica 05/21/2018  . FH: cardiovascular disease 05/21/2018  . Cigarette nicotine dependence in remission 05/21/2018  . Essential hypertension 05/13/2018  . Fatty liver 05/13/2018  . Night sweats 05/13/2018  . Osteoporosis 05/13/2018  . Osteopenia 05/13/2018  . Prediabetes 05/07/2018  . History of colon polyps 05/07/2018  . Arthralgia of left knee 05/07/2018  . Nonspecific chest pain 05/07/2018  . Urinary incontinence 05/07/2018  . Collagenous colitis 11/27/2017  . Special screening for malignant neoplasms, colon   . Hyperlipidemia 07/06/2015  . Polycythemia, secondary 07/06/2015  . IBS (irritable bowel syndrome) 07/06/2015  . Vitamin D deficiency 07/06/2015   Social History   Tobacco Use  . Smoking status: Former Smoker    Packs/day: 1.25    Years: 42.00    Pack years: 52.50    Last attempt to quit: 04/03/2014    Years since quitting: 4.4  . Smokeless tobacco: Never Used  Substance Use Topics  . Alcohol use: Yes    Alcohol/week: 0.0 standard drinks    Comment: wine daily   Review of Systems  Constitutional: Negative for chills, fatigue and fever.  HENT: + congestion, ear pain, sinus pain/pressure/drainage.   Eyes: Negative.   Respiratory: +dry  cough. Negative for shortness of breath and wheezing.   Cardiovascular: Negative for chest pain, palpitations and leg swelling.  Gastrointestinal: Negative for abdominal pain, diarrhea, nausea and vomiting.  Genitourinary: Negative for dysuria, frequency and urgency.  Musculoskeletal: Negative for arthralgias and myalgias.  Skin: Negative for color change, pallor and rash.  Neurological: Negative for syncope, light-headedness and headaches.  Psychiatric/Behavioral: The patient is not nervous/anxious.       Objective:   Physical Exam  Constitutional: She is oriented to person, place, and time. She appears well-nourished. No distress.  HENT:  Head: Normocephalic and atraumatic.  Nose: Mucosal edema, rhinorrhea and sinus tenderness present. Right sinus exhibits frontal sinus tenderness. Left sinus exhibits maxillary sinus tenderness and frontal sinus tenderness.  +thick nasal drainage and post nasal drip  Eyes: Conjunctivae and EOM are normal.  Neck: Neck supple. No tracheal deviation present.  Cardiovascular: Normal rate and regular rhythm.  Pulmonary/Chest: Effort normal and breath sounds normal. No respiratory distress. She has no wheezes. She has no rales.  Musculoskeletal: She exhibits no edema.  Neurological: She is alert and oriented to person, place, and time.  Skin: Skin is warm and dry. No erythema. No pallor.  Psychiatric: She has a normal mood and affect. Her behavior is normal.  Nursing note and vitals reviewed.     Vitals:   08/29/18 0952  BP: 130/84  Pulse: 88  Temp: 99.3 F (37.4 C)  SpO2: 97%   Assessment & Plan:   Acute sinusitis, sinus pain, postnasal drip - patient will  take amoxicillin twice daily for 10 days, I originally want to do Augmentin, but patient would prefer amoxicillin due to Augmentin being top on stomach.  Patient also advised to do saline nasal flushes to help wash out sinus congestion and improve sinus pressure.  Advised she can use a warm  compress on face also to help reduce pain.  She will rest, increase fluid intake and do good handwashing.  Patient also aware she can use an over-the-counter antihistamine like a Claritin, Zyrtec or Allegra to help dry up nasal congestion as well.  Return to clinic regularly scheduled follow-up with PCP, you may return sooner if issues arise.

## 2018-08-29 NOTE — Patient Instructions (Signed)

## 2018-09-17 ENCOUNTER — Encounter: Payer: Self-pay | Admitting: Internal Medicine

## 2018-10-25 IMAGING — DX DG LUMBAR SPINE COMPLETE 4+V
5 series · 5 of 5 positions shown · non-contrast
Comparison: None.

CLINICAL DATA: 61-year-old female with chronic low back pain.
Initial encounter.

EXAM:
LUMBAR SPINE - COMPLETE 4+ VIEW

[lumbar spine ap]
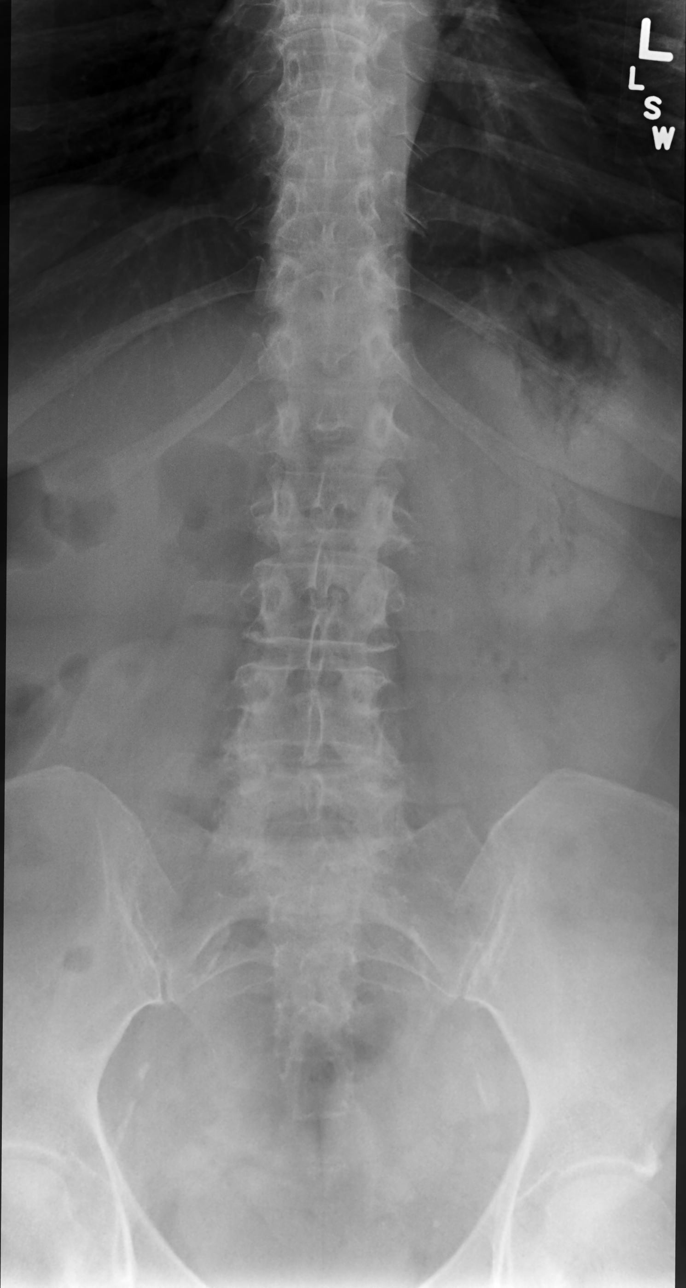

[lumbar spine obl (oblique) (1 of 2)]
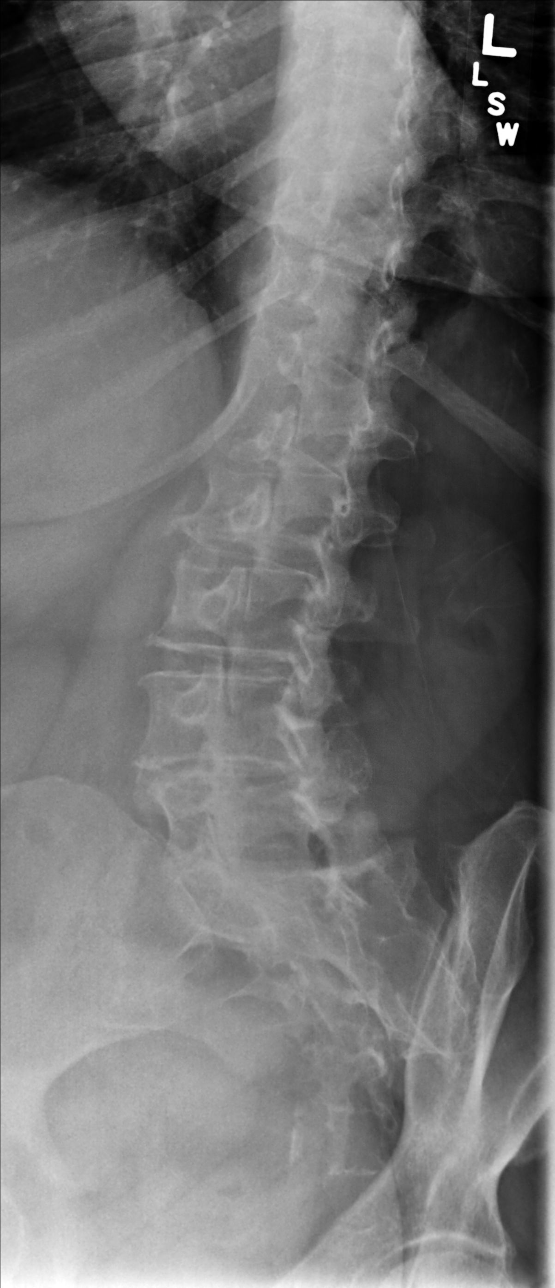

[lumbar spine obl (oblique) (2 of 2)]
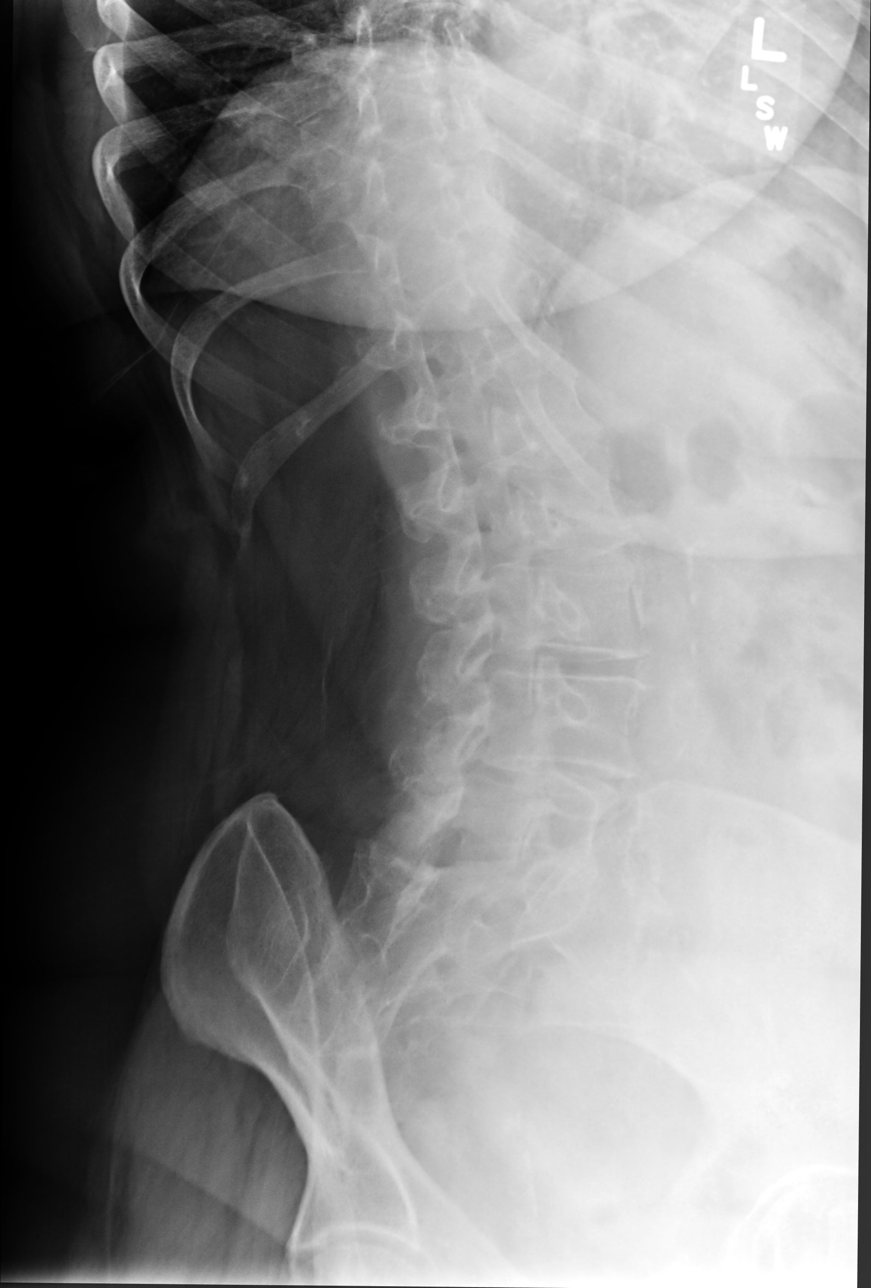

[lumbar spine lat]
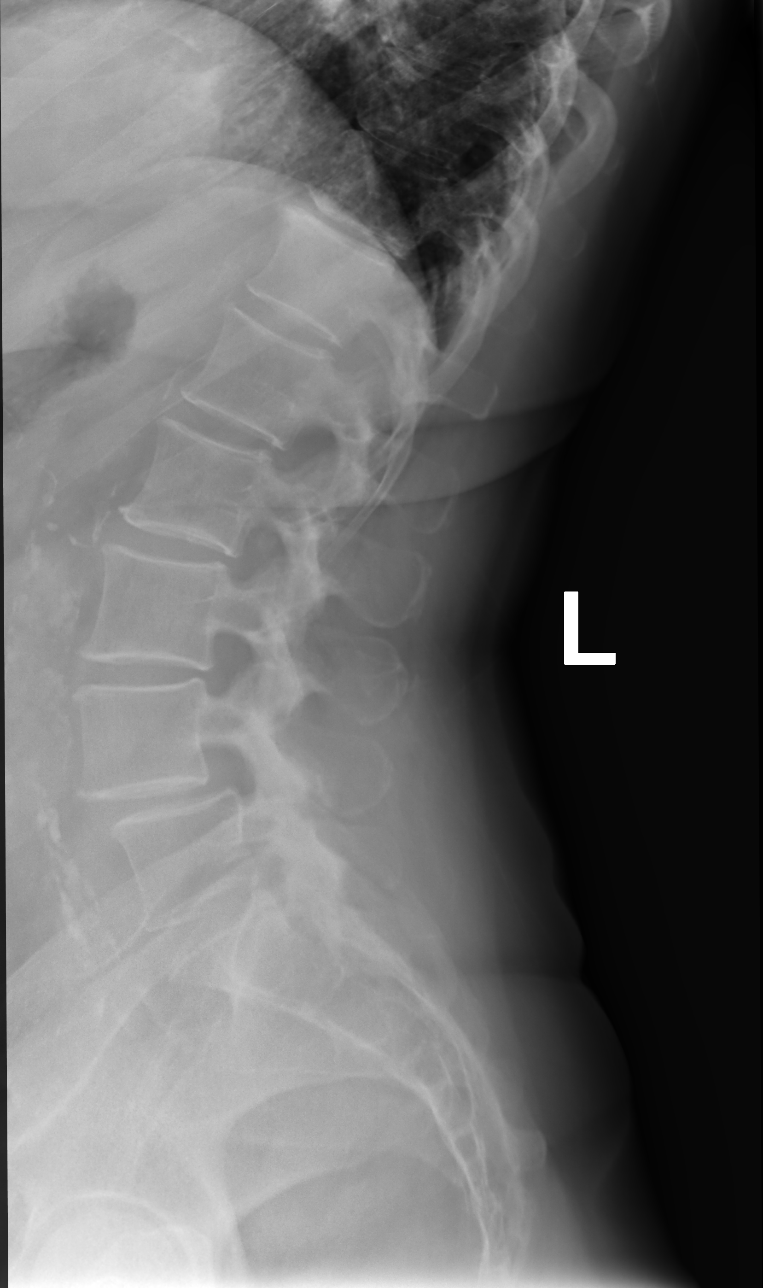

[lumbar spot lat]
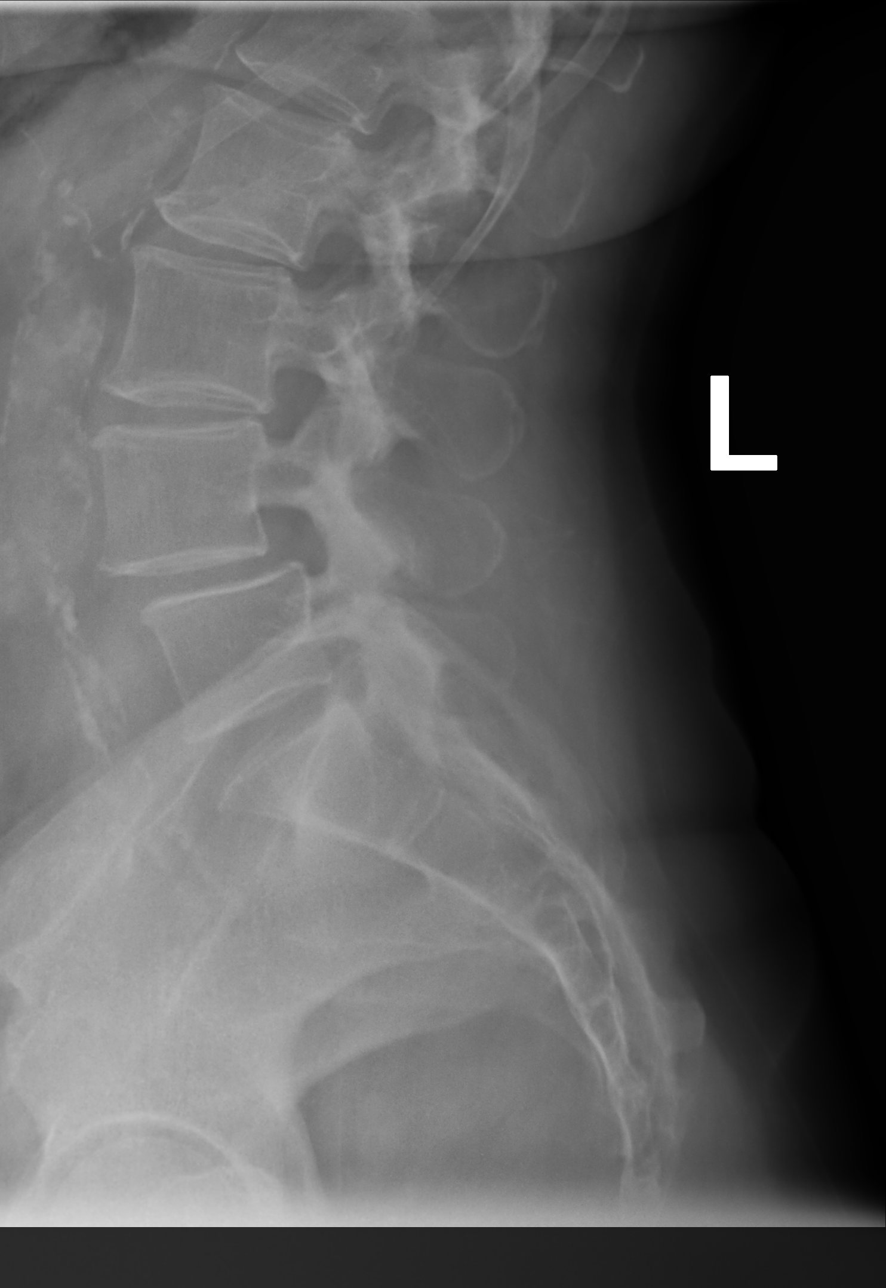

[5 of 5 positions shown; findings below may reference images not displayed]

FINDINGS: 5 mm anterior slip L4 secondary to facet degenerative changes. Mild
to moderate L4-5 disc space narrowing.

Minimal L3-4 disc space narrowing.

T11-12 mild disc degeneration with anterior osteophyte.

No compression fracture or pars defect noted.

Vascular calcifications.
IMPRESSION: 1. 5 mm anterior slip L4 secondary to facet degenerative changes.
Mild to moderate L4-5 disc space narrowing.
2. Minimal L3-4 disc space narrowing.
3. T12-L1 mild disc degeneration.
4.  Aortic Atherosclerosis (SFM28-F30.0).

## 2018-11-10 ENCOUNTER — Encounter: Payer: Self-pay | Admitting: Internal Medicine

## 2018-11-10 DIAGNOSIS — C4492 Squamous cell carcinoma of skin, unspecified: Secondary | ICD-10-CM | POA: Insufficient documentation

## 2018-11-11 IMAGING — CT CT CHEST LUNG CANCER SCREENING LOW DOSE W/O CM
1 of 5 series · 10 of 40 positions shown, 13 images · non-contrast
Comparison: 05/31/2010 chest radiograph.  No prior CT.

CLINICAL DATA: Ex-smoker, quitting 4 years ago. 52.5 pack-year
history.

EXAM:
CT CHEST WITHOUT CONTRAST LOW-DOSE FOR LUNG CANCER SCREENING
TECHNIQUE: Multidetector CT imaging of the chest was performed following the
standard protocol without IV contrast.

[ct lung segmentation data · axial · 0.55mm/px · z∈[-1240,-1240]mm · 10 of 282 frames shown]
[frame 1/282  mediastinal]
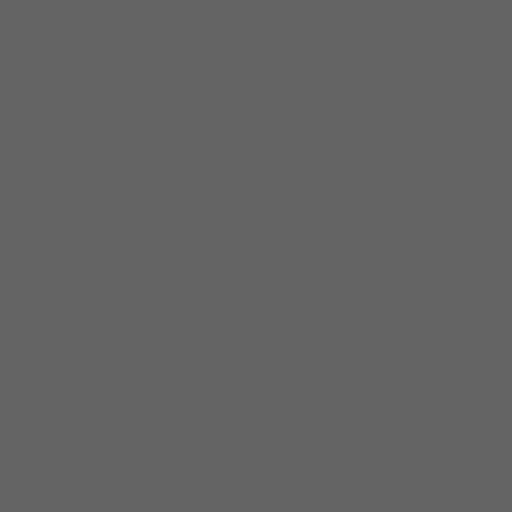
[frame 1/282  lung]
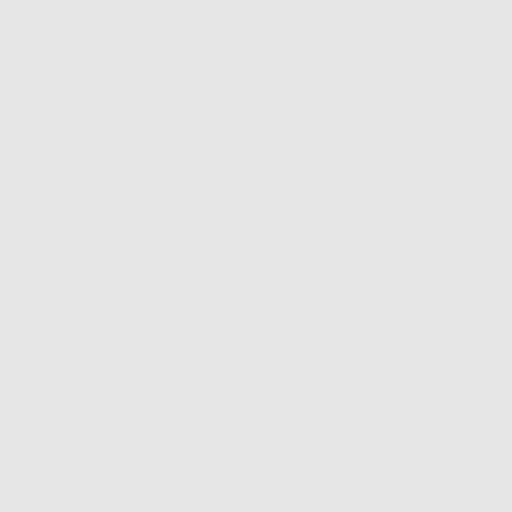
[frame 32/282  lung]
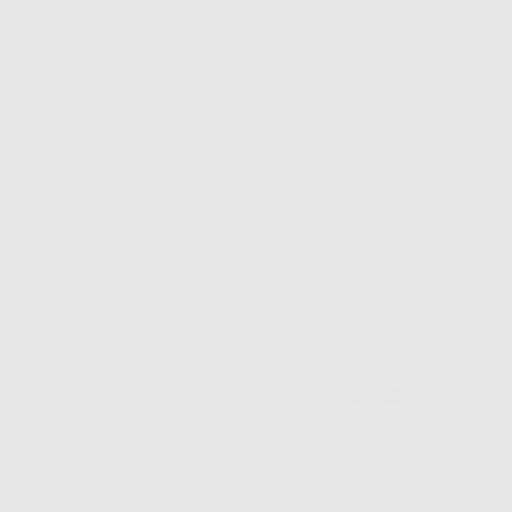
[frame 63/282  lung]
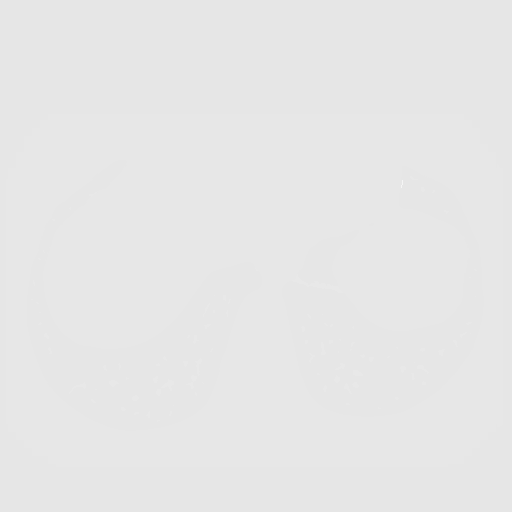
[frame 94/282  lung]
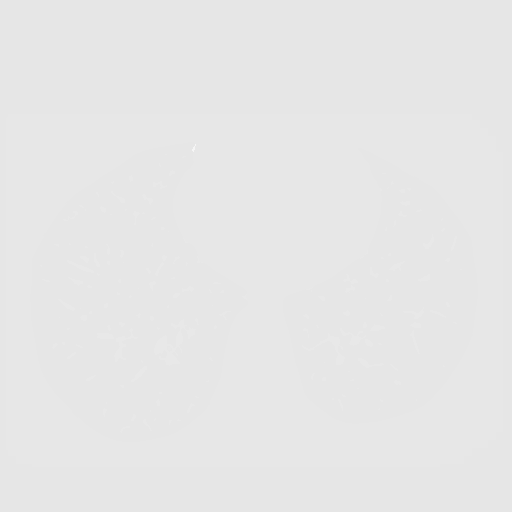
[frame 125/282  mediastinal]
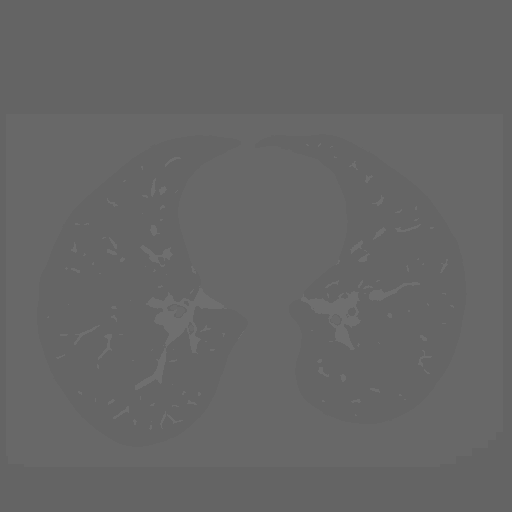
[frame 125/282  lung]
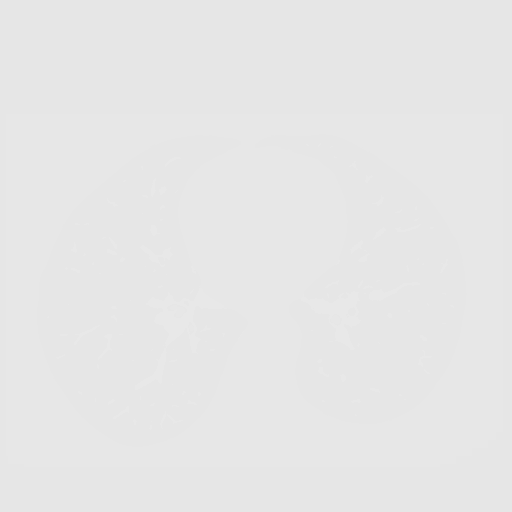
[frame 157/282  lung]
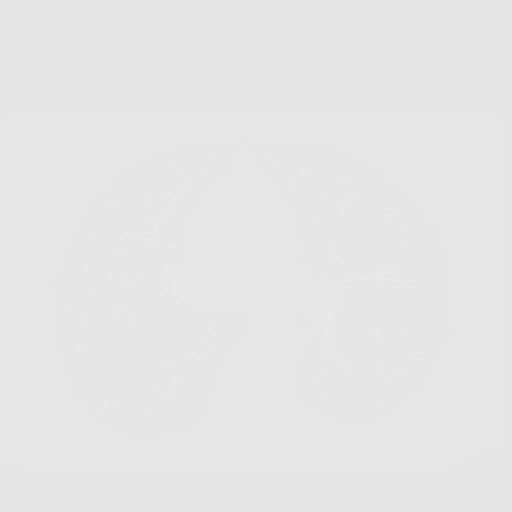
[frame 188/282  lung]
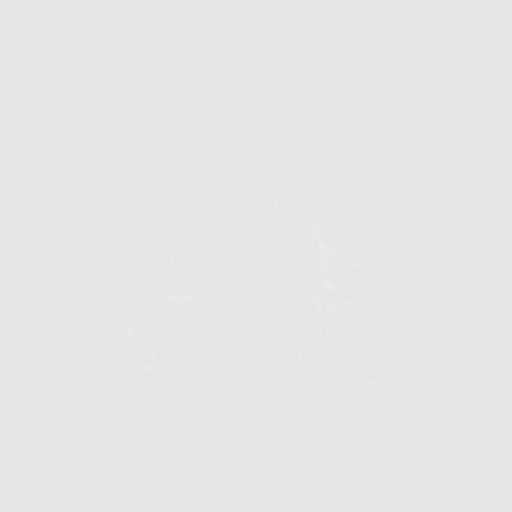
[frame 219/282  lung]
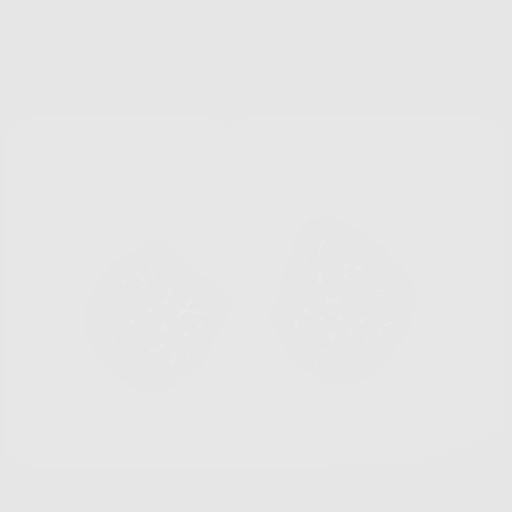
[frame 250/282  mediastinal]
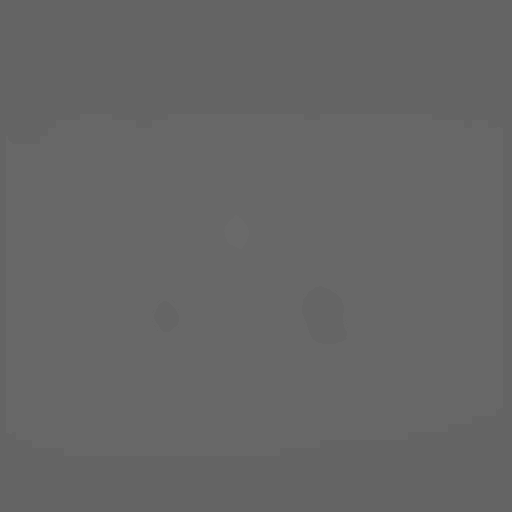
[frame 250/282  lung]
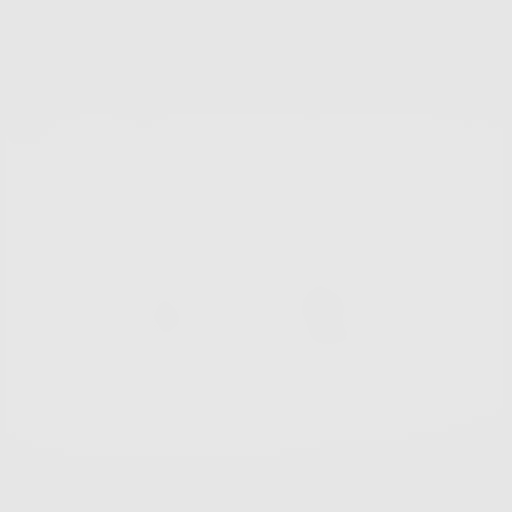
[frame 282/282  lung]
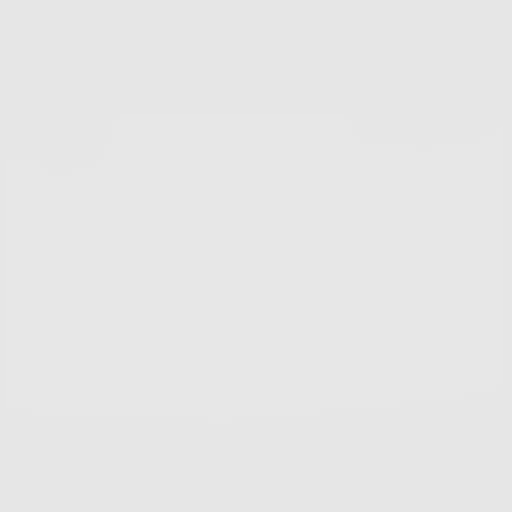

[10 of 40 positions shown; findings below may reference images not displayed]

FINDINGS: Cardiovascular: Aortic and branch vessel atherosclerosis. Tortuous
thoracic aorta. Normal heart size, without pericardial effusion. Lad
and right coronary artery calcification.

Mediastinum/Nodes: No mediastinal or definite hilar adenopathy,
given limitations of unenhanced CT.

Lungs/Pleura: No pleural fluid. Mild centrilobular emphysema.
Left-sided pulmonary nodules of maximally volume derived equivalent
diameter 1.9 mm.

Upper Abdomen: Mild to moderate hepatic steatosis. Normal imaged
portions of the spleen, stomach, pancreas, adrenal glands.

Musculoskeletal: Mild thoracic spondylosis.
IMPRESSION: 1. Lung-RADS 2, benign appearance or behavior. Continue annual
screening with low-dose chest CT without contrast in 12 months.
2. Age advanced coronary artery atherosclerosis. Recommend
assessment of coronary risk factors and consideration of medical
therapy.
3. Aortic atherosclerosis (51UTL-BG0.0) and emphysema (51UTL-YT4.3).
4. Hepatic steatosis.

## 2018-11-21 ENCOUNTER — Ambulatory Visit (INDEPENDENT_AMBULATORY_CARE_PROVIDER_SITE_OTHER): Payer: BLUE CROSS/BLUE SHIELD

## 2018-11-21 DIAGNOSIS — Z23 Encounter for immunization: Secondary | ICD-10-CM

## 2018-11-21 NOTE — Progress Notes (Signed)
Patient comes in for Twinrix injection. Injected right deltoid.  Patient tolerated injection well.    

## 2018-12-05 ENCOUNTER — Other Ambulatory Visit: Payer: Self-pay | Admitting: Internal Medicine

## 2018-12-05 DIAGNOSIS — E785 Hyperlipidemia, unspecified: Secondary | ICD-10-CM

## 2018-12-05 MED ORDER — SIMVASTATIN 20 MG PO TABS: 20.0000 mg | ORAL_TABLET | Freq: Every day | ORAL | 3 refills | Status: DC

## 2018-12-24 ENCOUNTER — Ambulatory Visit: Payer: BLUE CROSS/BLUE SHIELD

## 2019-01-20 ENCOUNTER — Other Ambulatory Visit: Payer: Self-pay | Admitting: Cardiovascular Disease

## 2019-01-28 ENCOUNTER — Telehealth: Payer: Self-pay

## 2019-01-28 NOTE — Telephone Encounter (Signed)
Copied from [place] [id number]. Topic: Appointment Scheduling - Scheduling Inquiry for Clinic >> Jan 27, 2019  4:30 PM Oneta Rack wrote: Patient cancelled her 01/30/2019 follow up appointment stating she would like to [facility] and not interested in a virtual appointment stating a face to face is needed to check her BP and have lab work. Advised patient will receive a follow up call with options regarding [facility] appointment, please advise

## 2019-01-30 ENCOUNTER — Ambulatory Visit: Payer: BLUE CROSS/BLUE SHIELD | Admitting: Internal Medicine

## 2019-01-30 NOTE — Telephone Encounter (Signed)
Spoke with pt. Tx

## 2019-04-22 ENCOUNTER — Other Ambulatory Visit: Payer: Self-pay | Admitting: Internal Medicine

## 2019-04-22 DIAGNOSIS — Z1231 Encounter for screening mammogram for malignant neoplasm of breast: Secondary | ICD-10-CM

## 2019-05-23 ENCOUNTER — Encounter: Payer: BLUE CROSS/BLUE SHIELD | Admitting: Internal Medicine

## 2019-06-03 ENCOUNTER — Ambulatory Visit
Admission: RE | Admit: 2019-06-03 | Discharge: 2019-06-03 | Disposition: A | Discharge status: Home / Self Care | Payer: BC Managed Care – PPO | Source: Ambulatory Visit | Attending: Internal Medicine | Admitting: Internal Medicine

## 2019-06-03 DIAGNOSIS — Z1231 Encounter for screening mammogram for malignant neoplasm of breast: Secondary | ICD-10-CM | POA: Insufficient documentation

## 2019-06-15 ENCOUNTER — Encounter: Payer: Self-pay | Admitting: Internal Medicine

## 2019-06-16 ENCOUNTER — Other Ambulatory Visit: Payer: Self-pay | Admitting: Internal Medicine

## 2019-06-16 ENCOUNTER — Telehealth: Payer: Self-pay | Admitting: Internal Medicine

## 2019-06-16 DIAGNOSIS — Z122 Encounter for screening for malignant neoplasm of respiratory organs: Secondary | ICD-10-CM

## 2019-06-16 NOTE — Telephone Encounter (Signed)
Last year I had a chest CT Scan. I think this is supposed to be done again this year. Should an order be put in for this? If so, please have your office contact me so I can make the appt. Vanessa Herman   Ordered 1 year f/u CT chest   [place]

## 2019-06-19 ENCOUNTER — Telehealth: Payer: Self-pay | Admitting: *Deleted

## 2019-06-19 DIAGNOSIS — Z87891 Personal history of nicotine dependence: Secondary | ICD-10-CM

## 2019-06-19 DIAGNOSIS — Z122 Encounter for screening for malignant neoplasm of respiratory organs: Secondary | ICD-10-CM

## 2019-06-19 NOTE — Telephone Encounter (Signed)
Patient has been notified that annual lung cancer screening low dose CT scan is due currently or will be in near future. Confirmed that patient is within the age range of 55-77, and asymptomatic, (no signs or symptoms of lung cancer). Patient denies illness that would prevent curative treatment for lung cancer if found. Verified smoking history, (former, quit 2015, 52.5 pack year). The shared decision making visit was done 06/07/18. Patient is agreeable for CT scan being scheduled.

## 2019-07-04 ENCOUNTER — Encounter: Payer: BC Managed Care – PPO | Admitting: Internal Medicine

## 2019-07-16 ENCOUNTER — Ambulatory Visit
Admission: RE | Admit: 2019-07-16 | Discharge: 2019-07-16 | Disposition: A | Discharge status: Home / Self Care | Payer: BC Managed Care – PPO | Source: Ambulatory Visit | Attending: Oncology | Admitting: Oncology

## 2019-07-16 ENCOUNTER — Other Ambulatory Visit: Payer: Self-pay

## 2019-07-16 DIAGNOSIS — Z122 Encounter for screening for malignant neoplasm of respiratory organs: Secondary | ICD-10-CM | POA: Diagnosis not present

## 2019-07-16 DIAGNOSIS — Z87891 Personal history of nicotine dependence: Secondary | ICD-10-CM | POA: Diagnosis present

## 2019-07-18 ENCOUNTER — Other Ambulatory Visit (HOSPITAL_COMMUNITY)
Admission: RE | Admit: 2019-07-18 | Discharge: 2019-07-18 | Disposition: A | Discharge status: Home / Self Care | Payer: BC Managed Care – PPO | Source: Ambulatory Visit | Attending: Internal Medicine | Admitting: Internal Medicine

## 2019-07-18 ENCOUNTER — Encounter: Payer: Self-pay | Admitting: *Deleted

## 2019-07-18 ENCOUNTER — Telehealth: Payer: Self-pay | Admitting: Internal Medicine

## 2019-07-18 ENCOUNTER — Ambulatory Visit (INDEPENDENT_AMBULATORY_CARE_PROVIDER_SITE_OTHER): Payer: BC Managed Care – PPO | Admitting: Internal Medicine

## 2019-07-18 ENCOUNTER — Encounter: Payer: Self-pay | Admitting: Internal Medicine

## 2019-07-18 ENCOUNTER — Other Ambulatory Visit: Payer: Self-pay

## 2019-07-18 VITALS — BP 122/76 | HR 89 | Temp 97.7°F | Ht <= 58 in | Wt 147.4 lb

## 2019-07-18 DIAGNOSIS — Z1159 Encounter for screening for other viral diseases: Secondary | ICD-10-CM

## 2019-07-18 DIAGNOSIS — I10 Essential (primary) hypertension: Secondary | ICD-10-CM

## 2019-07-18 DIAGNOSIS — Z124 Encounter for screening for malignant neoplasm of cervix: Secondary | ICD-10-CM

## 2019-07-18 DIAGNOSIS — Z1329 Encounter for screening for other suspected endocrine disorder: Secondary | ICD-10-CM | POA: Diagnosis not present

## 2019-07-18 DIAGNOSIS — Z0184 Encounter for antibody response examination: Secondary | ICD-10-CM

## 2019-07-18 DIAGNOSIS — E119 Type 2 diabetes mellitus without complications: Secondary | ICD-10-CM

## 2019-07-18 DIAGNOSIS — Z0000 Encounter for general adult medical examination without abnormal findings: Secondary | ICD-10-CM

## 2019-07-18 DIAGNOSIS — K760 Fatty (change of) liver, not elsewhere classified: Secondary | ICD-10-CM

## 2019-07-18 DIAGNOSIS — I059 Rheumatic mitral valve disease, unspecified: Secondary | ICD-10-CM | POA: Insufficient documentation

## 2019-07-18 DIAGNOSIS — I3481 Nonrheumatic mitral (valve) annulus calcification: Secondary | ICD-10-CM | POA: Insufficient documentation

## 2019-07-18 DIAGNOSIS — E611 Iron deficiency: Secondary | ICD-10-CM

## 2019-07-18 NOTE — Telephone Encounter (Signed)
Also reviewing CT chest  There are calcifications on mitral valve in the heart  -echo of the heart is recommended   -is she agreeable?

## 2019-07-18 NOTE — Patient Instructions (Signed)
Exercising to Lose Weight Exercise is structured, repetitive physical activity to improve fitness and health. Getting regular exercise is important for everyone. It is especially important if you are overweight. Being overweight increases your risk of heart disease, stroke, diabetes, high blood pressure, and several types of cancer. Reducing your calorie intake and exercising can help you lose weight. Exercise is usually categorized as moderate or vigorous intensity. To lose weight, most people need to do a certain amount of moderate-intensity or vigorous-intensity exercise each week. Moderate-intensity exercise  Moderate-intensity exercise is any activity that gets you moving enough to burn at least three times more energy (calories) than if you were sitting. Examples of moderate exercise include:  Walking a mile in 15 minutes.  Doing light yard work.  Biking at an easy pace. Most people should get at least 150 minutes (2 hours and 30 minutes) a week of moderate-intensity exercise to maintain their body weight. Vigorous-intensity exercise Vigorous-intensity exercise is any activity that gets you moving enough to burn at least six times more calories than if you were sitting. When you exercise at this intensity, you should be working hard enough that you are not able to carry on a conversation. Examples of vigorous exercise include:  Running.  Playing a team sport, such as football, basketball, and soccer.  Jumping rope. Most people should get at least 75 minutes (1 hour and 15 minutes) a week of vigorous-intensity exercise to maintain their body weight. How can exercise affect me? When you exercise enough to burn more calories than you eat, you lose weight. Exercise also reduces body fat and builds muscle. The more muscle you have, the more calories you burn. Exercise also:  Improves mood.  Reduces stress and tension.  Improves your overall fitness, flexibility, and endurance.   Increases bone strength. The amount of exercise you need to lose weight depends on:  Your age.  The type of exercise.  Any health conditions you have.  Your overall physical ability. Talk to your health care provider about how much exercise you need and what types of activities are safe for you. What actions can I take to lose weight? Nutrition   Make changes to your diet as told by your health care provider or diet and nutrition specialist (dietitian). This may include: ? Eating fewer calories. ? Eating more protein. ? Eating less unhealthy fats. ? Eating a diet that includes fresh fruits and vegetables, whole grains, low-fat dairy products, and lean protein. ? Avoiding foods with added fat, salt, and sugar.  Drink plenty of water while you exercise to prevent dehydration or heat stroke. Activity  Choose an activity that you enjoy and set realistic goals. Your health care provider can help you make an exercise plan that works for you.  Exercise at a moderate or vigorous intensity most days of the week. ? The intensity of exercise may vary from person to person. You can tell how intense a workout is for you by paying attention to your breathing and heartbeat. Most people will notice their breathing and heartbeat get faster with more intense exercise.  Do resistance training twice each week, such as: ? Push-ups. ? Sit-ups. ? Lifting weights. ? Using resistance bands.  Getting short amounts of exercise can be just as helpful as long structured periods of exercise. If you have trouble finding time to exercise, try to include exercise in your daily routine. ? Get up, stretch, and walk around every 30 minutes throughout the day. ? Go for a   walk during your lunch break. ? Park your car farther away from your destination. ? If you take public transportation, get off one stop early and walk the rest of the way. ? Make phone calls while standing up and walking around. ? Take the  stairs instead of elevators or escalators.  Wear comfortable clothes and shoes with good support.  Do not exercise so much that you hurt yourself, feel dizzy, or get very short of breath. Where to find more information  [place] Department of Health and Human Services: www.hhs.gov  Centers for Disease Control and Prevention (CDC): www.cdc.gov Contact a health care provider:  Before starting a new exercise program.  If you have questions or concerns about your weight.  If you have a medical problem that keeps you from exercising. Get help right away if you have any of the following while exercising:  Injury.  Dizziness.  Difficulty breathing or shortness of breath that does not go away when you stop exercising.  Chest pain.  Rapid heartbeat. Summary  Being overweight increases your risk of heart disease, stroke, diabetes, high blood pressure, and several types of cancer.  Losing weight happens when you burn more calories than you eat.  Reducing the amount of calories you eat in addition to getting regular moderate or vigorous exercise each week helps you lose weight. This information is not intended to replace advice given to you by your health care provider. Make sure you discuss any questions you have with your health care provider. Document Released: 11/04/2010 Document Revised: 10/15/2017 Document Reviewed: 10/15/2017 Elsevier Patient Education  2020 Else[employer] Inc.  Diabetes Mellitus and Nutrition, Adult When you have diabetes (diabetes mellitus), it is very important to have healthy eating habits because your blood sugar (glucose) levels are greatly affected by what you eat and drink. Eating healthy foods in the appropriate amounts, at about the same times every day, can help you:  Control your blood glucose.  Lower your risk of heart disease.  Improve your blood pressure.  Reach or maintain a healthy weight. Every person with diabetes is different, and each person  has different needs for a meal plan. Your health care provider may recommend that you work with a diet and nutrition specialist (dietitian) to make a meal plan that is best for you. Your meal plan may vary depending on factors such as:  The calories you need.  The medicines you take.  Your weight.  Your blood glucose, blood pressure, and cholesterol levels.  Your activity level.  Other health conditions you have, such as heart or kidney disease. How do carbohydrates affect me? Carbohydrates, also called carbs, affect your blood glucose level more than any other type of food. Eating carbs naturally raises the amount of glucose in your blood. Carb counting is a method for keeping track of how many carbs you eat. Counting carbs is important to keep your blood glucose at a healthy level, especially if you use insulin or take certain oral diabetes medicines. It is important to know how many carbs you can safely have in each meal. This is different for every person. Your dietitian can help you calculate how many carbs you should have at each meal and for each snack. Foods that contain carbs include:  Bread, cereal, rice, pasta, and crackers.  Potatoes and corn.  Peas, beans, and lentils.  Milk and yogurt.  Fruit and juice.  Desserts, such as cakes, cookies, ice cream, and candy. How does alcohol affect me? Alcohol can cause   a sudden decrease in blood glucose (hypoglycemia), especially if you use insulin or take certain oral diabetes medicines. Hypoglycemia can be a life-threatening condition. Symptoms of hypoglycemia (sleepiness, dizziness, and confusion) are similar to symptoms of having too much alcohol. If your health care provider says that alcohol is safe for you, follow these guidelines:  Limit alcohol intake to no more than 1 drink per day for nonpregnant women and 2 drinks per day for men. One drink equals 12 oz of beer, 5 oz of wine, or 1 oz of hard liquor.  Do not drink on an  empty stomach.  Keep yourself hydrated with water, diet soda, or unsweetened iced tea.  Keep in mind that regular soda, juice, and other mixers may contain a lot of sugar and must be counted as carbs. What are tips for following this plan?  Reading food labels  Start by checking the serving size on the "Nutrition Facts" label of packaged foods and drinks. The amount of calories, carbs, fats, and other nutrients listed on the label is based on one serving of the item. Many items contain more than one serving per package.  Check the total grams (g) of carbs in one serving. You can calculate the number of servings of carbs in one serving by dividing the total carbs by 15. For example, if a food has 30 g of total carbs, it would be equal to 2 servings of carbs.  Check the number of grams (g) of saturated and trans fats in one serving. Choose foods that have low or no amount of these fats.  Check the number of milligrams (mg) of salt (sodium) in one serving. Most people should limit total sodium intake to less than 2,300 mg per day.  Always check the nutrition information of foods labeled as "low-fat" or "nonfat". These foods may be higher in added sugar or refined carbs and should be avoided.  Talk to your dietitian to identify your daily goals for nutrients listed on the label. Shopping  Avoid buying canned, premade, or processed foods. These foods tend to be high in fat, sodium, and added sugar.  Shop around the outside edge of the grocery store. This includes fresh fruits and vegetables, bulk grains, fresh meats, and fresh dairy. Cooking  Use low-heat cooking methods, such as baking, instead of high-heat cooking methods like deep frying.  Cook using healthy oils, such as olive, canola, or sunflower oil.  Avoid cooking with butter, cream, or high-fat meats. Meal planning  Eat meals and snacks regularly, preferably at the same times every day. Avoid going long periods of time without  eating.  Eat foods high in fiber, such as fresh fruits, vegetables, beans, and whole grains. Talk to your dietitian about how many servings of carbs you can eat at each meal.  Eat 4-6 ounces (oz) of lean protein each day, such as lean meat, chicken, fish, eggs, or tofu. One oz of lean protein is equal to: ? 1 oz of meat, chicken, or fish. ? 1 egg. ?  cup of tofu.  Eat some foods each day that contain healthy fats, such as avocado, nuts, seeds, and fish. Lifestyle  Check your blood glucose regularly.  Exercise regularly as told by your health care provider. This may include: ? 150 minutes of moderate-intensity or vigorous-intensity exercise each week. This could be brisk walking, biking, or water aerobics. ? Stretching and doing strength exercises, such as yoga or weightlifting, at least 2 times a week.  Take medicines as   told by your health care provider.  Do not use any products that contain nicotine or tobacco, such as cigarettes and e-cigarettes. If you need help quitting, ask your health care provider.  Work with a counselor or diabetes educator to identify strategies to manage stress and any emotional and social challenges. Questions to ask a health care provider  Do I need to meet with a diabetes educator?  Do I need to meet with a dietitian?  What number can I call if I have questions?  When are the best times to check my blood glucose? Where to find more information:  American Diabetes Association: diabetes.org  Academy of Nutrition and Dietetics: www.eatright.org  National Institute of Diabetes and Digestive and Kidney Diseases (NIH): www.niddk.nih.gov Summary  A healthy meal plan will help you control your blood glucose and maintain a healthy lifestyle.  Working with a diet and nutrition specialist (dietitian) can help you make a meal plan that is best for you.  Keep in mind that carbohydrates (carbs) and alcohol have immediate effects on your blood glucose  levels. It is important to count carbs and to use alcohol carefully. This information is not intended to replace advice given to you by your health care provider. Make sure you discuss any questions you have with your health care provider. Document Released: 06/29/2005 Document Revised: 09/14/2017 Document Reviewed: 11/06/2016 Elsevier Patient Education  2020 Elsevier Inc.  

## 2019-07-18 NOTE — Progress Notes (Signed)
Pre visit review using our clinic review tool, if applicable. No additional management support is needed unless otherwise documented below in the visit note. 

## 2019-07-18 NOTE — Progress Notes (Signed)
Chief Complaint  Patient presents with  . Annual Exam   Annual  1. DM 2 not on meds last A1c 6.8   2. HTN BP controlled on hctz 12.5 and lis 20 mg qd  Review of Systems  Constitutional: Negative for weight loss.  HENT: Negative for hearing loss.   Eyes: Negative for blurred vision.  Respiratory: Negative for shortness of breath.   Cardiovascular: Negative for chest pain.  Gastrointestinal: Negative for abdominal pain.  Musculoskeletal: Negative for falls.  Skin: Negative for rash.  Neurological: Negative for headaches.  Psychiatric/Behavioral: Negative for depression.   Past Medical History:  Diagnosis Date  . Allergy   . Anxiety   . Chicken pox   . Collagenous colitis    Dr. Allen Norris   . Colon polyps   . DDD (degenerative disc disease), cervical    had PT in the past, noted imaging 05/31/10   . Depression   . Fatty liver   . Ganglion cyst of wrist, left   . Hemorrhoids   . Hemorrhoids   . History of colon polyps   . History of prediabetes   . Hyperlipidemia 07/06/2015  . Hypertension   . IBS (irritable bowel syndrome) 07/06/2015  . NS (neck stiffness)    xray shows something at "C3" approx 4 yrs ago. Had PT. Helped.  . Polycythemia, secondary 07/06/2015  . Prediabetes   . SCC (squamous cell carcinoma)    scalp s/p mohs 11/07/2018 [facility] Dr. Manley Mason  . Urinary incontinence   . Vitamin D deficiency 07/06/2015   Past Surgical History:  Procedure Laterality Date  . COLONOSCOPY  2006  . COLONOSCOPY WITH PROPOFOL N/A 08/12/2015   Procedure: COLONOSCOPY WITH PROPOFOL;  Surgeon: [name], MD;  Location: [facility];  Service: Endoscopy;  Laterality: N/A;  PT WOULD LIKE EARLY AM PER JS  . HYSTEROSALPINGOGRAM     1983  . TONSILLECTOMY AND ADENOIDECTOMY     age [age]   Family History  Problem Relation Age of Onset  . Breast cancer Maternal Grandmother 60  . Cancer Maternal Grandmother        breast  . Hypertension Father   . Hyperlipidemia Father   . COPD Father    . Hearing loss Father   . Heart disease Father   . Heart attack Father   . Diabetes Mother   . Hyperlipidemia Mother   . Stroke Mother   . Thyroid disease Daughter   . Heart disease Maternal Grandfather   . Heart attack Maternal Grandfather   . Heart disease Paternal Grandfather   . Heart attack Paternal Grandfather   . Osteoporosis Other   . Heart disease Paternal Grandmother    Social History   Socioeconomic History  . Marital status: Married    Spouse name: Not on file  . Number of children: Not on file  . Years of education: Not on file  . Highest education level: Not on file  Occupational History  . Not on file  Social Needs  . Financial resource strain: Not on file  . Food insecurity    Worry: Not on file    Inability: Not on file  . Transportation needs    Medical: Not on file    Non-medical: Not on file  Tobacco Use  . Smoking status: Former Smoker    Packs/day: 1.25    Years: 42.00    Pack years: 52.50    Quit date: 04/03/2014    Years since quitting: 5.2  . Smokeless  tobacco: Never Used  Substance and Sexual Activity  . Alcohol use: Yes    Alcohol/week: 0.0 standard drinks    Comment: wine daily  . Drug use: No  . Sexual activity: Not Currently  Lifestyle  . Physical activity    Days per week: Not on file    Minutes per session: Not on file  . Stress: Not on file  Relationships  . Social Herbalist on phone: Not on file    Gets together: Not on file    Attends religious service: Not on file    Active member of club or organization: Not on file    Attends meetings of clubs or organizations: Not on file    Relationship status: Not on file  . Intimate partner violence    Fear of current or ex partner: Not on file    Emotionally abused: Not on file    Physically abused: Not on file    Forced sexual activity: Not on file  Other Topics Concern  . Not on file  Social History Narrative   Married    Some college, retired    2 daughters     No guns    Wears seat belt    Safe in relationship    Current Meds  Medication Sig  . aspirin 81 MG tablet Take 81 mg by mouth every other day.   . budesonide (ENTOCORT EC) 3 MG 24 hr capsule Take 3 capsules (9 mg total) by mouth every morning. (Patient taking differently: Take 3 mg by mouth daily. )  . calcium carbonate (CALCIUM 600) 600 MG TABS tablet Calcium 600 + D(3) 600 mg (1,500 mg)-400 unit tablet  . Cholecalciferol (VITAMIN D) 2000 units CAPS Vitamin D3 2,000 unit capsule   1 capsule every day by oral route.  . diazepam (VALIUM) 5 MG tablet Take 1 tablet (5 mg total) by mouth 2 (two) times daily as needed for anxiety.  . diphenhydrAMINE (BENADRYL) 50 MG tablet Take 50 mg by mouth at bedtime as needed for sleep.   . hydrochlorothiazide (MICROZIDE) 12.5 MG capsule Take 1 capsule (12.5 mg total) by mouth daily.  Marland Kitchen lisinopril (PRINIVIL,ZESTRIL) 20 MG tablet TAKE 1 TABLET BY MOUTH DAILY  . metoprolol tartrate (LOPRESSOR) 25 MG tablet Take 1 tablet (25 mg total) by mouth daily.  . Multiple Vitamin (MULTIVITAMIN) tablet Take 1 tablet by mouth every other day.   . Multiple Vitamins-Minerals (MULTIVITAMIN ADULT PO) Complete Multivitamin  . simvastatin (ZOCOR) 20 MG tablet Take 1 tablet (20 mg total) by mouth daily at 6 PM.  . [DISCONTINUED] amoxicillin (AMOXIL) 875 MG tablet Take 1 tablet (875 mg total) by mouth 2 (two) times daily.   Allergies  Allergen Reactions  . Lactase Diarrhea  . Lipitor [Atorvastatin] Swelling    Swelling and leg pain  . Neomycin Other (See Comments)    Nausea    . Pollen Extract Other (See Comments)  . Levaquin [Levofloxacin In D5w] Nausea And Vomiting    Lightheadedness    No results found for this or any previous visit (from the past 2160 hour(s)). Objective  Body mass index is 30.81 kg/m. Wt Readings from Last 3 Encounters:  07/18/19 147 lb 6.4 oz (66.9 kg)  07/16/19 145 lb (65.8 kg)  08/29/18 143 lb (64.9 kg)   Temp Readings from Last 3  Encounters:  07/18/19 97.7 F (36.5 C) (Oral)  08/29/18 99.3 F (37.4 C) (Oral)  07/30/18 98.9 F (37.2 C) (Oral)  BP Readings from Last 3 Encounters:  07/18/19 122/76  08/29/18 130/84  08/06/18 139/86   Pulse Readings from Last 3 Encounters:  07/18/19 89  08/29/18 88  08/06/18 81    Physical Exam Vitals signs and nursing note reviewed. Exam conducted with a chaperone present.  Constitutional:      Appearance: Normal appearance. She is well-developed and well-groomed. She is obese.  HENT:     Head: Normocephalic and atraumatic.     Comments: +mask on   Eyes:     Conjunctiva/sclera: Conjunctivae normal.     Pupils: Pupils are equal, round, and reactive to light.  Cardiovascular:     Rate and Rhythm: Normal rate and regular rhythm.     Heart sounds: Normal heart sounds. No murmur.  Pulmonary:     Effort: Pulmonary effort is normal.     Breath sounds: Normal breath sounds.  Chest:     Breasts: Breasts are symmetrical.        Right: Normal. No swelling, bleeding, inverted nipple, mass, nipple discharge, skin change or tenderness.        Left: Normal. No swelling, bleeding, inverted nipple, mass, nipple discharge, skin change or tenderness.  Abdominal:     General: Abdomen is flat. Bowel sounds are normal.     Tenderness: There is no abdominal tenderness.  Genitourinary:    Pubic Area: No rash.      Labia:        Right: No rash, tenderness, lesion or injury.        Left: No rash, tenderness, lesion or injury.      Vagina: Normal.     Cervix: Normal.     Uterus: Normal.      Adnexa: Right adnexa normal and left adnexa normal.  Lymphadenopathy:     Upper Body:     Right upper body: No axillary adenopathy.     Left upper body: No axillary adenopathy.  Skin:    General: Skin is warm and dry.  Neurological:     General: No focal deficit present.     Mental Status: She is alert and oriented to person, place, and time. Mental status is at baseline.     Gait: Gait  normal.  Psychiatric:        Attention and Perception: Attention and perception normal.        Mood and Affect: Mood and affect normal.        Speech: Speech normal.        Behavior: Behavior normal. Behavior is cooperative.        Thought Content: Thought content normal.        Cognition and Memory: Cognition and memory normal.        Judgment: Judgment normal.     Assessment  Plan  Annual physical exam Flu shot had 07/18/19 Tdap utd  Consider shingrix in future twinrix 3/3 due 11/21/18  mammo 06/03/19 neg  DEXA 06/11/18 osteopenia  Colonoscopy [facility] 08/12/15 I[facility] and + collagenous colitiswill rec pt f/u with GI for these medications. Also h/o colon polyps  Pap negative done today   Referred dermatology [facility] SCC scalp Bethanie Dicker 06/2019  Former smoker quit 2015 total 16 years max 1.5 ppd no FH lung cancer  -CT chest had 05/2018, 07/16/19 stable  Quit smoking in 2015  Last eye exam 2019 [facility] rec healthy diet and exercise    Essential hypertension - Plan: CBC w/Diff, Comprehensive metabolic panel, Lipid panel, Urinalysis, Routine w reflex  microscopic, Microalbumin / creatinine urine ratio -cont meds   Type 2 diabetes mellitus without complication, without long-term current use of insulin (HCC) - Plan: Hemoglobin A1c, Urinalysis, Routine w reflex microscopic, Microalbumin / creatinine urine ratio  Provider: Dr. Olivia Mackie McLean-Scocuzza-[facility]

## 2019-07-21 NOTE — Telephone Encounter (Signed)
Patient says she see Dr. Rockey Situ at the end of the month patient ask if PCP will send message toDr. Rockey Situ with recommendation so everyone is aware and in agreement to have Echo.

## 2019-07-22 ENCOUNTER — Other Ambulatory Visit: Payer: Self-pay | Admitting: Internal Medicine

## 2019-07-22 DIAGNOSIS — I10 Essential (primary) hypertension: Secondary | ICD-10-CM

## 2019-07-22 DIAGNOSIS — I3481 Nonrheumatic mitral (valve) annulus calcification: Secondary | ICD-10-CM

## 2019-07-22 DIAGNOSIS — I059 Rheumatic mitral valve disease, unspecified: Secondary | ICD-10-CM

## 2019-07-22 NOTE — Telephone Encounter (Signed)
Dr. Rockey Situ  Echo ordered for CT chest noted  There are calcifications on mitral valve in the heart and echo recommended   Pt wanted you to be aware also

## 2019-07-23 ENCOUNTER — Telehealth: Payer: Self-pay | Admitting: Cardiovascular Disease

## 2019-07-23 NOTE — Telephone Encounter (Signed)
Pt c/o of Chest Pain: STAT if CP now or developed within 24 hours  1. Are you having CP right now? No, but awoken this morning with them  2. Are you experiencing any other symptoms (ex. SOB, nausea, vomiting, sweating)? no  3. How long have you been experiencing CP?  Just this morning  4. Is your CP continuous or coming and going? Comes and goes,   5. Have you taken Nitroglycerin? no ?

## 2019-07-23 NOTE — Telephone Encounter (Signed)
Returned call to patient, she reports 2 episodes of sharp pains while sleeping in left underarm area lasting seconds and went away. Was 5/10 at that time.   Pt reports hx of palpitations. None that she knows of recently. Pt denies any other pain or sx, denies taking any medication for sx.   Pt reports recent CT scan ordered by PCP that showed coronary artery dx, Dr. Aundra Dubin wanted further evaluation of valves. Echo has been ordered and scheduled for nov.   OV with Dr. Rockey Situ scheduled for 10/27.

## 2019-07-25 ENCOUNTER — Other Ambulatory Visit (INDEPENDENT_AMBULATORY_CARE_PROVIDER_SITE_OTHER): Payer: BC Managed Care – PPO

## 2019-07-25 ENCOUNTER — Other Ambulatory Visit: Payer: Self-pay

## 2019-07-25 DIAGNOSIS — E119 Type 2 diabetes mellitus without complications: Secondary | ICD-10-CM | POA: Diagnosis not present

## 2019-07-25 DIAGNOSIS — I10 Essential (primary) hypertension: Secondary | ICD-10-CM | POA: Diagnosis not present

## 2019-07-25 DIAGNOSIS — Z1329 Encounter for screening for other suspected endocrine disorder: Secondary | ICD-10-CM

## 2019-07-25 DIAGNOSIS — E611 Iron deficiency: Secondary | ICD-10-CM

## 2019-07-25 LAB — COMPREHENSIVE METABOLIC PANEL
ALT: 38 U/L — ABNORMAL HIGH (ref 0–35)
AST: 34 U/L (ref 0–37)
Albumin: 4.6 g/dL (ref 3.5–5.2)
Alkaline Phosphatase: 56 U/L (ref 39–117)
BUN: 18 mg/dL (ref 6–23)
CO2: 28 mEq/L (ref 19–32)
Calcium: 10 mg/dL (ref 8.4–10.5)
Chloride: 101 mEq/L (ref 96–112)
Creatinine, Ser: 0.88 mg/dL (ref 0.40–1.20)
GFR: 64.71 mL/min (ref >60.00)
Glucose, Bld: 134 mg/dL — ABNORMAL HIGH (ref 70–99)
Potassium: 4.6 mEq/L (ref 3.5–5.1)
Sodium: 139 mEq/L (ref 135–145)
Total Bilirubin: 0.5 mg/dL (ref 0.2–1.2)
Total Protein: 6.8 g/dL (ref 6.0–8.3)

## 2019-07-25 LAB — CBC WITH DIFFERENTIAL/PLATELET
Basophils Absolute: 0.1 10*3/uL (ref 0.0–0.1)
Basophils Relative: 1.4 % (ref 0.0–3.0)
Eosinophils Absolute: 0.1 10*3/uL (ref 0.0–0.7)
Eosinophils Relative: 2.3 % (ref 0.0–5.0)
HCT: 41 % (ref 36.0–46.0)
Hemoglobin: 13.6 g/dL (ref 12.0–15.0)
Lymphocytes Relative: 32.9 % (ref 12.0–46.0)
Lymphs Abs: 1.8 10*3/uL (ref 0.7–4.0)
MCHC: 33.2 g/dL (ref 30.0–36.0)
MCV: 96.6 fl (ref 78.0–100.0)
Monocytes Absolute: 0.4 10*3/uL (ref 0.1–1.0)
Monocytes Relative: 7.5 % (ref 3.0–12.0)
Neutro Abs: 3.1 10*3/uL (ref 1.4–7.7)
Neutrophils Relative %: 55.9 % (ref 43.0–77.0)
Platelets: 289 10*3/uL (ref 150.0–400.0)
RBC: 4.25 Mil/uL (ref 3.87–5.11)
RDW: 13.5 % (ref 11.5–15.5)
WBC: 5.5 10*3/uL (ref 4.0–10.5)

## 2019-07-25 LAB — CYTOLOGY - PAP
Diagnosis: NEGATIVE
High risk HPV: NEGATIVE

## 2019-07-25 LAB — LIPID PANEL
Cholesterol: 142 mg/dL (ref 0–200)
HDL: 48.2 mg/dL (ref >39.00)
LDL Cholesterol: 55 mg/dL (ref 0–99)
NonHDL: 93.96
Total CHOL/HDL Ratio: 3
Triglycerides: 194 mg/dL — ABNORMAL HIGH (ref 0.0–149.0)
VLDL: 38.8 mg/dL (ref 0.0–40.0)

## 2019-07-25 LAB — TSH: TSH: 2.51 u[IU]/mL (ref 0.35–4.50)

## 2019-07-25 LAB — HEMOGLOBIN A1C: Hgb A1c MFr Bld: 7.3 % — ABNORMAL HIGH (ref 4.6–6.5)

## 2019-07-26 LAB — URINALYSIS, ROUTINE W REFLEX MICROSCOPIC
Bilirubin Urine: NEGATIVE
Glucose, UA: NEGATIVE
Hgb urine dipstick: NEGATIVE
Ketones, ur: NEGATIVE
Leukocytes,Ua: NEGATIVE
Nitrite: NEGATIVE
Protein, ur: NEGATIVE
Specific Gravity, Urine: 1.013 (ref 1.001–1.03)
pH: 5.5 (ref 5.0–8.0)

## 2019-07-26 LAB — IRON,TIBC AND FERRITIN PANEL
%SAT: 26 % (calc) (ref 16–45)
Ferritin: 119 ng/mL (ref 16–288)
Iron: 97 ug/dL (ref 45–160)
TIBC: 374 mcg/dL (calc) (ref 250–450)

## 2019-07-26 LAB — MICROALBUMIN / CREATININE URINE RATIO
Creatinine, Urine: 88 mg/dL (ref 20–275)
Microalb Creat Ratio: 3 mcg/mg creat (ref <30)
Microalb, Ur: 0.3 mg/dL

## 2019-07-26 NOTE — Telephone Encounter (Signed)
It is up to her, We can look at the CT images together and determine the extent of calcification if she would like...before ordering echo.

## 2019-07-26 NOTE — Telephone Encounter (Signed)
Ok thx.

## 2019-07-28 ENCOUNTER — Other Ambulatory Visit: Payer: Self-pay | Admitting: Internal Medicine

## 2019-07-28 DIAGNOSIS — E1165 Type 2 diabetes mellitus with hyperglycemia: Secondary | ICD-10-CM

## 2019-07-28 MED ORDER — BLOOD GLUCOSE METER KIT: PACK | 0 refills | Status: DC

## 2019-07-28 NOTE — Telephone Encounter (Signed)
Patient made aware of Dr. Donivan Scull response and recommendation. She will keep her 10/27 appt with him and discuss the finding of her recent CT at that visit. Patient voiced appreciation for the call back.

## 2019-08-10 NOTE — Progress Notes (Signed)
Cardiology Office Note  Date:  08/12/2019   ID:  Vanessa Herman, DOB [birth year], MRN [id number]  PCP:  McLean-Scocuzza, Nino Glow, MD   Chief Complaint  Patient presents with  . Other    12 month follow up. Patient c/o A1C Elevated and palpitations. Meds reviewed verbally with patient.     HPI:  [name] is a 63 year old woman with past medical history of Ove Coronary calcification on CT scan Diabetes type 2 Hypertension Obesity Aortic atherosclerosis Mild in the arch,  Stopped smoking 5 yrs ago Presents for f/u of her palpitations and family history of coronary disease  Sedentary, Walked 6 blocks with her sister, legs hurt, SOB  had to stop and rest Feels deconditioned, has not been doing any regular aerobic activity  Active at home, lots of chores, around the house but again no  walking program  Tired by noon, on computer all afternoon, reading Gets tired around noon  She has been tracking blood pressure for over a year Pressures before pills, in the morning around 3:97 673A systolic standing Sometimes when she first wake up, up to 150 sitting position Lots of questions concerning her blood pressure  Wine in the PM  Labs reviewed with her in detail HBA1C 7.3 Total chol 142, LDL 55  EKG personally reviewed by myself on todays visit nsr with rate 74 bpm  FH dad and both grandparents heart dz,   Prior CT scan August 2019 Showing coronary calcification, aortic atherosclerosis  Prior carotid ultrasound August 2019 Minor carotid atherosclerosis less than 50% bilaterally   PMH:   has a past medical history of Allergy, Anxiety, Chicken pox, Collagenous colitis, Colon polyps, DDD (degenerative disc disease), cervical, Depression, Fatty liver, Ganglion cyst of wrist, left, Hemorrhoids, Hemorrhoids, History of colon polyps, History of prediabetes, Hyperlipidemia (07/06/2015), Hypertension, IBS (irritable bowel syndrome) (07/06/2015), NS (neck stiffness), Polycythemia,  secondary (07/06/2015), Prediabetes, SCC (squamous cell carcinoma), Urinary incontinence, and Vitamin D deficiency (07/06/2015).  PSH:    Past Surgical History:  Procedure Laterality Date  . COLONOSCOPY  2006  . COLONOSCOPY WITH PROPOFOL N/A 08/12/2015   Procedure: COLONOSCOPY WITH PROPOFOL;  Surgeon: [name], MD;  Location: [place];  Service: Endoscopy;  Laterality: N/A;  PT WOULD LIKE EARLY AM PER JS  . HYSTEROSALPINGOGRAM     1983  . TONSILLECTOMY AND ADENOIDECTOMY     age [age]    Current Outpatient Medications  Medication Sig Dispense Refill  . aspirin 81 MG tablet Take 81 mg by mouth every other day.     . blood glucose meter kit and supplies Dispense based on patient and insurance preference. Use up to bid daily as directed. (FOR ICD-10 E10.9, E11.9). 1 each 0  . budesonide (ENTOCORT EC) 3 MG 24 hr capsule Take 1 Tablet (3MG) By Mouth Every Other Day.    . calcium carbonate (CALCIUM 600) 600 MG TABS tablet Calcium 600 + D(3) 600 mg (1,500 mg)-400 unit tablet    . Cholecalciferol (VITAMIN D) 2000 units CAPS Vitamin D3 2,000 unit capsule   1 capsule every day by oral route.    . diazepam (VALIUM) 5 MG tablet Take 1 tablet (5 mg total) by mouth 2 (two) times daily as needed for anxiety. 60 tablet 2  . diphenhydrAMINE (BENADRYL) 50 MG tablet Take 50 mg by mouth at bedtime as needed for sleep.     . hydrochlorothiazide (MICROZIDE) 12.5 MG capsule Take 1 capsule (12.5 mg total) by mouth daily. 90 capsule 1  .  lisinopril (PRINIVIL,ZESTRIL) 20 MG tablet TAKE 1 TABLET BY MOUTH DAILY 90 tablet 2  . metoprolol tartrate (LOPRESSOR) 25 MG tablet Take 1 tablet (25 mg total) by mouth daily. 180 tablet 3  . Multiple Vitamin (MULTIVITAMIN) tablet Take 1 tablet by mouth every other day.     . Multiple Vitamins-Minerals (MULTIVITAMIN ADULT PO) Complete Multivitamin    . simvastatin (ZOCOR) 20 MG tablet Take 1 tablet (20 mg total) by mouth daily at 6 PM. 90 tablet 3   No current  facility-administered medications for this visit.      Allergies:   Lactase, Lipitor [atorvastatin], Neomycin, Pollen extract, and Levaquin [levofloxacin in d5w]   Social History:  The patient  reports that she quit smoking about 5 years ago. She has a 52.50 pack-year smoking history. She has never used smokeless tobacco. She reports current alcohol use. She reports that she does not use drugs.   Family History:   family history includes Breast cancer (age of onset: 76) in her maternal grandmother; COPD in her father; Cancer in her maternal grandmother; Diabetes in her mother; Hearing loss in her father; Heart attack in her father, maternal grandfather, and paternal grandfather; Heart disease in her father, maternal grandfather, paternal grandfather, and paternal grandmother; Hyperlipidemia in her father and mother; Hypertension in her father; Osteoporosis in an other family member; Stroke in her mother; Thyroid disease in her daughter.    Review of Systems: Review of Systems  Constitutional: Negative.   HENT: Negative.   Respiratory: Negative.   Cardiovascular: Negative.   Gastrointestinal: Negative.   Musculoskeletal: Negative.   Neurological: Negative.   Psychiatric/Behavioral: Negative.   All other systems reviewed and are negative.   PHYSICAL EXAM: VS:  BP (!) 152/92 (BP Location: Left Arm, Patient Position: Sitting, Cuff Size: Normal)   Pulse 74   Ht 4' 10" (1.473 m)   Wt 147 lb (66.7 kg)   BMI 30.72 kg/m  , BMI Body mass index is 30.72 kg/m. Constitutional:  oriented to person, place, and time. No distress.  HENT:  Head: Grossly normal Eyes:  no discharge. No scleral icterus.  Neck: No JVD, no carotid bruits  Cardiovascular: Regular rate and rhythm, no murmurs appreciated Pulmonary/Chest: Clear to auscultation bilaterally, no wheezes or rails Abdominal: Soft.  no distension.  no tenderness.  Musculoskeletal: Normal range of motion Neurological:  normal muscle tone.  Coordination normal. No atrophy Skin: Skin warm and dry Psychiatric: normal affect, pleasant  Recent Labs: 07/25/2019: ALT 38; BUN 18; Creatinine, Ser 0.88; Hemoglobin 13.6; Platelets 289.0; Potassium 4.6; Sodium 139; TSH 2.51    Lipid Panel Lab Results  Component Value Date   CHOL 142 07/25/2019   HDL 48.20 07/25/2019   LDLCALC 55 07/25/2019   TRIG 194.0 (H) 07/25/2019      Wt Readings from Last 3 Encounters:  08/12/19 147 lb (66.7 kg)  07/18/19 147 lb 6.4 oz (66.9 kg)  07/16/19 145 lb (65.8 kg)     ASSESSMENT AND PLAN:  Aortic atherosclerosis (HCC) Mild in the arch,  Stopped smoking 5 yrs ago Lipids at goal, no further workup  Coronary artery calcification seen on CAT scan - Plan: EKG 12-Lead mild disease, LAD and LCX Aggressive diabetes control recommended , diet guide given  Essential hypertension Blood pressure is well controlled on today's visit. No changes made to the medications.  Controlled type 2 diabetes mellitus without complication, without long-term current use of insulin ([place]) We have encouraged continued exercise, careful diet management in an effort  to lose weight.  Palpitations Well controlled  No changes to her regiment  Nonspecific chest pain Atypical, no further workup at this time Suggested walking  Mixed hyperlipidemia Cholesterol is at goal on the current lipid regimen. No changes to the medications were made.  Disposition:   F/U 12 months   Total encounter time more than 25 minutes  Greater than 50% was spent in counseling and coordination of care with the patient   Orders Placed This Encounter  Procedures  . EKG 12-Lead     Signed, Esmond Plants, M.D., Ph.D. 08/12/2019  [place], [place]

## 2019-08-12 ENCOUNTER — Ambulatory Visit (INDEPENDENT_AMBULATORY_CARE_PROVIDER_SITE_OTHER): Payer: BC Managed Care – PPO | Admitting: Cardiovascular Disease

## 2019-08-12 ENCOUNTER — Encounter: Payer: Self-pay | Admitting: Cardiovascular Disease

## 2019-08-12 ENCOUNTER — Other Ambulatory Visit: Payer: Self-pay

## 2019-08-12 VITALS — BP 152/92 | HR 74 | Ht <= 58 in | Wt 147.0 lb

## 2019-08-12 DIAGNOSIS — I2510 Atherosclerotic heart disease of native coronary artery without angina pectoris: Secondary | ICD-10-CM

## 2019-08-12 DIAGNOSIS — E119 Type 2 diabetes mellitus without complications: Secondary | ICD-10-CM | POA: Diagnosis not present

## 2019-08-12 DIAGNOSIS — R002 Palpitations: Secondary | ICD-10-CM

## 2019-08-12 DIAGNOSIS — I700 Atherosclerosis of aorta: Secondary | ICD-10-CM

## 2019-08-12 DIAGNOSIS — I10 Essential (primary) hypertension: Secondary | ICD-10-CM | POA: Diagnosis not present

## 2019-08-12 NOTE — Patient Instructions (Signed)

## 2019-08-17 ENCOUNTER — Encounter: Payer: Self-pay | Admitting: Internal Medicine

## 2019-08-19 ENCOUNTER — Other Ambulatory Visit: Payer: Self-pay | Admitting: Internal Medicine

## 2019-08-19 DIAGNOSIS — F419 Anxiety disorder, unspecified: Secondary | ICD-10-CM

## 2019-08-19 MED ORDER — DIAZEPAM 5 MG PO TABS: 5.0000 mg | ORAL_TABLET | Freq: Two times a day (BID) | ORAL | 2 refills | Status: DC | PRN

## 2019-08-22 ENCOUNTER — Other Ambulatory Visit: Payer: Self-pay | Admitting: Internal Medicine

## 2019-08-22 DIAGNOSIS — I3481 Nonrheumatic mitral (valve) annulus calcification: Secondary | ICD-10-CM

## 2019-08-22 DIAGNOSIS — I059 Rheumatic mitral valve disease, unspecified: Secondary | ICD-10-CM

## 2019-08-29 ENCOUNTER — Other Ambulatory Visit: Payer: Self-pay

## 2019-08-29 ENCOUNTER — Ambulatory Visit (INDEPENDENT_AMBULATORY_CARE_PROVIDER_SITE_OTHER): Payer: BC Managed Care – PPO

## 2019-08-29 DIAGNOSIS — I059 Rheumatic mitral valve disease, unspecified: Secondary | ICD-10-CM | POA: Diagnosis not present

## 2019-08-29 DIAGNOSIS — I3481 Nonrheumatic mitral (valve) annulus calcification: Secondary | ICD-10-CM

## 2019-08-29 MED ORDER — PERFLUTREN LIPID MICROSPHERE
1.0000 mL | INTRAVENOUS | Status: AC | PRN
  Administered 2019-08-29: 2 mL via INTRAVENOUS

## 2019-08-30 ENCOUNTER — Encounter: Payer: Self-pay | Admitting: Internal Medicine

## 2019-09-01 ENCOUNTER — Encounter: Payer: Self-pay | Admitting: Internal Medicine

## 2019-09-22 ENCOUNTER — Other Ambulatory Visit: Payer: Self-pay | Admitting: Cardiovascular Disease

## 2019-10-06 ENCOUNTER — Other Ambulatory Visit: Payer: Self-pay | Admitting: Internal Medicine

## 2019-10-06 ENCOUNTER — Encounter: Payer: Self-pay | Admitting: Internal Medicine

## 2019-10-06 DIAGNOSIS — E785 Hyperlipidemia, unspecified: Secondary | ICD-10-CM

## 2019-10-06 MED ORDER — SIMVASTATIN 20 MG PO TABS: 20.0000 mg | ORAL_TABLET | Freq: Every day | ORAL | 3 refills | Status: DC

## 2019-11-05 ENCOUNTER — Telehealth: Payer: Self-pay | Admitting: Internal Medicine

## 2019-11-05 NOTE — Telephone Encounter (Signed)
Pt is requesting to have labs done before her next appt

## 2019-11-05 NOTE — Telephone Encounter (Signed)
Lab orders need to be placed

## 2019-11-06 NOTE — Telephone Encounter (Signed)
Sch fasting labs if avail. Before 12/16/19  Otherwise [facility] let me know which one  [place]

## 2019-11-06 NOTE — Addendum Note (Signed)
Addended by: Orland Mustard on: 11/06/2019 12:32 PM   Modules accepted: Orders

## 2019-11-10 ENCOUNTER — Encounter: Payer: Self-pay | Admitting: Internal Medicine

## 2019-11-17 NOTE — Addendum Note (Signed)
Addended by: Orland Mustard on: 11/17/2019 09:30 AM   Modules accepted: Orders

## 2019-12-16 ENCOUNTER — Ambulatory Visit: Payer: BC Managed Care – PPO | Admitting: Internal Medicine

## 2019-12-19 ENCOUNTER — Other Ambulatory Visit: Payer: Self-pay

## 2019-12-19 ENCOUNTER — Other Ambulatory Visit (INDEPENDENT_AMBULATORY_CARE_PROVIDER_SITE_OTHER): Payer: BC Managed Care – PPO

## 2019-12-19 DIAGNOSIS — I10 Essential (primary) hypertension: Secondary | ICD-10-CM

## 2019-12-19 DIAGNOSIS — E119 Type 2 diabetes mellitus without complications: Secondary | ICD-10-CM

## 2019-12-19 DIAGNOSIS — Z0184 Encounter for antibody response examination: Secondary | ICD-10-CM

## 2019-12-19 DIAGNOSIS — Z1159 Encounter for screening for other viral diseases: Secondary | ICD-10-CM

## 2019-12-19 LAB — COMPREHENSIVE METABOLIC PANEL
ALT: 44 U/L — ABNORMAL HIGH (ref 0–35)
AST: 39 U/L — ABNORMAL HIGH (ref 0–37)
Albumin: 4.5 g/dL (ref 3.5–5.2)
Alkaline Phosphatase: 58 U/L (ref 39–117)
BUN: 15 mg/dL (ref 6–23)
CO2: 28 mEq/L (ref 19–32)
Calcium: 10.1 mg/dL (ref 8.4–10.5)
Chloride: 100 mEq/L (ref 96–112)
Creatinine, Ser: 0.87 mg/dL (ref 0.40–1.20)
GFR: 65.49 mL/min (ref >60.00)
Glucose, Bld: 134 mg/dL — ABNORMAL HIGH (ref 70–99)
Potassium: 4.4 mEq/L (ref 3.5–5.1)
Sodium: 136 mEq/L (ref 135–145)
Total Bilirubin: 0.5 mg/dL (ref 0.2–1.2)
Total Protein: 7.3 g/dL (ref 6.0–8.3)

## 2019-12-19 LAB — CBC WITH DIFFERENTIAL/PLATELET
Basophils Absolute: 0.1 10*3/uL (ref 0.0–0.1)
Basophils Relative: 1.2 % (ref 0.0–3.0)
Eosinophils Absolute: 0.1 10*3/uL (ref 0.0–0.7)
Eosinophils Relative: 1.6 % (ref 0.0–5.0)
HCT: 42.2 % (ref 36.0–46.0)
Hemoglobin: 14.3 g/dL (ref 12.0–15.0)
Lymphocytes Relative: 34.9 % (ref 12.0–46.0)
Lymphs Abs: 2 10*3/uL (ref 0.7–4.0)
MCHC: 33.9 g/dL (ref 30.0–36.0)
MCV: 95.3 fl (ref 78.0–100.0)
Monocytes Absolute: 0.5 10*3/uL (ref 0.1–1.0)
Monocytes Relative: 9.1 % (ref 3.0–12.0)
Neutro Abs: 3.1 10*3/uL (ref 1.4–7.7)
Neutrophils Relative %: 53.2 % (ref 43.0–77.0)
Platelets: 251 10*3/uL (ref 150.0–400.0)
RBC: 4.43 Mil/uL (ref 3.87–5.11)
RDW: 13.2 % (ref 11.5–15.5)
WBC: 5.8 10*3/uL (ref 4.0–10.5)

## 2019-12-19 LAB — LIPID PANEL
Cholesterol: 139 mg/dL (ref 0–200)
HDL: 48 mg/dL (ref >39.00)
LDL Cholesterol: 61 mg/dL (ref 0–99)
NonHDL: 90.78
Total CHOL/HDL Ratio: 3
Triglycerides: 148 mg/dL (ref 0.0–149.0)
VLDL: 29.6 mg/dL (ref 0.0–40.0)

## 2019-12-19 LAB — HEMOGLOBIN A1C: Hgb A1c MFr Bld: 6.9 % — ABNORMAL HIGH (ref 4.6–6.5)

## 2019-12-22 LAB — HEPATITIS B SURFACE ANTIBODY, QUANTITATIVE: Hep B S AB Quant (Post): 195 m[IU]/mL (ref >=10)

## 2019-12-24 ENCOUNTER — Other Ambulatory Visit: Payer: Self-pay

## 2019-12-26 ENCOUNTER — Other Ambulatory Visit: Payer: Self-pay

## 2019-12-26 ENCOUNTER — Encounter: Payer: Self-pay | Admitting: Internal Medicine

## 2019-12-26 ENCOUNTER — Ambulatory Visit: Payer: BC Managed Care – PPO | Admitting: Internal Medicine

## 2019-12-26 ENCOUNTER — Other Ambulatory Visit: Payer: Self-pay | Admitting: Internal Medicine

## 2019-12-26 ENCOUNTER — Ambulatory Visit (INDEPENDENT_AMBULATORY_CARE_PROVIDER_SITE_OTHER): Payer: BC Managed Care – PPO

## 2019-12-26 VITALS — BP 114/70 | HR 85 | Temp 97.2°F | Ht <= 58 in | Wt 143.8 lb

## 2019-12-26 DIAGNOSIS — K52831 Collagenous colitis: Secondary | ICD-10-CM

## 2019-12-26 DIAGNOSIS — Z1231 Encounter for screening mammogram for malignant neoplasm of breast: Secondary | ICD-10-CM

## 2019-12-26 DIAGNOSIS — I10 Essential (primary) hypertension: Secondary | ICD-10-CM

## 2019-12-26 DIAGNOSIS — M7121 Synovial cyst of popliteal space [Baker], right knee: Secondary | ICD-10-CM

## 2019-12-26 DIAGNOSIS — G8929 Other chronic pain: Secondary | ICD-10-CM | POA: Diagnosis not present

## 2019-12-26 DIAGNOSIS — M25561 Pain in right knee: Secondary | ICD-10-CM | POA: Diagnosis not present

## 2019-12-26 DIAGNOSIS — F419 Anxiety disorder, unspecified: Secondary | ICD-10-CM

## 2019-12-26 DIAGNOSIS — R7989 Other specified abnormal findings of blood chemistry: Secondary | ICD-10-CM

## 2019-12-26 DIAGNOSIS — E119 Type 2 diabetes mellitus without complications: Secondary | ICD-10-CM

## 2019-12-26 DIAGNOSIS — K760 Fatty (change of) liver, not elsewhere classified: Secondary | ICD-10-CM

## 2019-12-26 MED ORDER — METOPROLOL TARTRATE 25 MG PO TABS: 25.0000 mg | ORAL_TABLET | Freq: Every day | ORAL | 3 refills | Status: DC

## 2019-12-26 MED ORDER — BUDESONIDE 3 MG PO CPEP: 9.0000 mg | ORAL_CAPSULE | Freq: Every day | ORAL | 3 refills | Status: DC

## 2019-12-26 NOTE — Progress Notes (Addendum)
Chief Complaint  Patient presents with  . Follow-up  . Knee Pain    right knee pain started in oct/2020. Pain was 7/10 then it went away. On and off since then    F/u  1. C/o right knee pain on and off 7/10 at times 12/26/19 negative pain since 08/2019 or 09/2019 nothing tried other than ibuprofen. Back of knee and inner back knee hurting and feels like puling sensation now knee pain moving from back of knee to front and both sides  2. DM 2 6.9 not on medications trying with diet and exercise eye exam scheduled 01/2020  3. HTN lis 20 mg qd, lopressor 25 mg qhs, hctz 12.5 mg qd  4. H/o collagenous colitis has seen Dr. Allen Norris in the past and he states she can f/u PCP budesonide per pt on 3 mg (9 mg total) daily and this initially controlled diarrhea now she is taking it 3 mg qod  5. Anxiety on prn valium 5 mg but does not need refill  GAD 7 score 2 and PHQ 9 score 3 today    Review of Systems  Constitutional: Positive for weight loss.  HENT: Negative for hearing loss.   Eyes: Negative for blurred vision.  Respiratory: Negative for shortness of breath.   Cardiovascular: Negative for chest pain.  Gastrointestinal: Negative for abdominal pain.  Musculoskeletal: Positive for joint pain.  Skin: Negative for rash.  Neurological: Negative for headaches.  Psychiatric/Behavioral: Negative for depression.   Past Medical History:  Diagnosis Date  . Allergy   . Anxiety   . Chicken pox   . Collagenous colitis    Dr. Allen Norris   . Colon polyps   . DDD (degenerative disc disease), cervical    had PT in the past, noted imaging 05/31/10   . Depression   . Fatty liver   . Ganglion cyst of wrist, left   . Hemorrhoids   . Hemorrhoids   . History of colon polyps   . History of prediabetes   . Hyperlipidemia 07/06/2015  . Hypertension   . IBS (irritable bowel syndrome) 07/06/2015  . NS (neck stiffness)    xray shows something at "C3" approx 4 yrs ago. Had PT. Helped.  . Polycythemia, secondary 07/06/2015   . Prediabetes   . SCC (squamous cell carcinoma)    scalp s/p mohs 11/07/2018 [facility] Dr. Manley Mason  . Urinary incontinence   . Vitamin D deficiency 07/06/2015   Past Surgical History:  Procedure Laterality Date  . COLONOSCOPY  2006  . COLONOSCOPY WITH PROPOFOL N/A 08/12/2015   Procedure: COLONOSCOPY WITH PROPOFOL;  Surgeon: [name], MD;  Location: [place];  Service: Endoscopy;  Laterality: N/A;  PT WOULD LIKE EARLY AM PER JS  . HYSTEROSALPINGOGRAM     1983  . TONSILLECTOMY AND ADENOIDECTOMY     age [age]   Family History  Problem Relation Age of Onset  . Breast cancer Maternal Grandmother 60  . Cancer Maternal Grandmother        breast  . Hypertension Father   . Hyperlipidemia Father   . COPD Father   . Hearing loss Father   . Heart disease Father   . Heart attack Father   . Diabetes Mother   . Hyperlipidemia Mother   . Stroke Mother   . Thyroid disease Daughter   . Heart disease Maternal Grandfather   . Heart attack Maternal Grandfather   . Heart disease Paternal Grandfather   . Heart attack Paternal Grandfather   .  Osteoporosis Other   . Heart disease Paternal Grandmother    Social History   Socioeconomic History  . Marital status: Married    Spouse name: Not on file  . Number of children: Not on file  . Years of education: Not on file  . Highest education level: Not on file  Occupational History  . Not on file  Tobacco Use  . Smoking status: Former Smoker    Packs/day: 1.25    Years: 42.00    Pack years: 52.50    Quit date: 04/03/2014    Years since quitting: 5.7  . Smokeless tobacco: Never Used  Substance and Sexual Activity  . Alcohol use: Yes    Alcohol/week: 0.0 standard drinks    Comment: wine daily  . Drug use: No  . Sexual activity: Not Currently  Other Topics Concern  . Not on file  Social History Narrative   Married    Some college, retired    2 daughters    No guns    Wears seat belt    Safe in relationship    Social  Determinants of Radio broadcast assistant Strain:   . Difficulty of Paying Living Expenses:   Food Insecurity:   . Worried About Charity fundraiser in the Last Year:   . Arboriculturist in the Last Year:   Transportation Needs:   . Film/video editor (Medical):   Marland Kitchen Lack of Transportation (Non-Medical):   Physical Activity:   . Days of Exercise per Week:   . Minutes of Exercise per Session:   Stress:   . Feeling of Stress :   Social Connections:   . Frequency of Communication with Friends and Family:   . Frequency of Social Gatherings with Friends and Family:   . Attends Religious Services:   . Active Member of Clubs or Organizations:   . Attends Archivist Meetings:   Marland Kitchen Marital Status:   Intimate Partner Violence:   . Fear of Current or Ex-Partner:   . Emotionally Abused:   Marland Kitchen Physically Abused:   . Sexually Abused:    Current Meds  Medication Sig  . aspirin 81 MG tablet Take 81 mg by mouth every other day.   . blood glucose meter kit and supplies Dispense based on patient and insurance preference. Use up to bid daily as directed. (FOR ICD-10 E10.9, E11.9).  . budesonide (ENTOCORT EC) 3 MG 24 hr capsule Take 3 capsules (9 mg total) by mouth daily. Take 1 Tablet (3MG) By Mouth Every Other Day.  . calcium carbonate (CALCIUM 600) 600 MG TABS tablet Calcium 600 + D(3) 600 mg (1,500 mg)-400 unit tablet  . Cholecalciferol (VITAMIN D) 2000 units CAPS Vitamin D3 2,000 unit capsule   1 capsule every day by oral route.  . diazepam (VALIUM) 5 MG tablet Take 1 tablet (5 mg total) by mouth 2 (two) times daily as needed for anxiety.  . diphenhydrAMINE (BENADRYL) 50 MG tablet Take 50 mg by mouth at bedtime as needed for sleep.   Marland Kitchen lisinopril (ZESTRIL) 20 MG tablet TAKE 1 TABLET BY MOUTH EVERY DAY  . metoprolol tartrate (LOPRESSOR) 25 MG tablet Take 1 tablet (25 mg total) by mouth daily. QHS  . Multiple Vitamins-Minerals (MULTIVITAMIN ADULT PO) Complete Multivitamin  .  simvastatin (ZOCOR) 20 MG tablet Take 1 tablet (20 mg total) by mouth daily at 6 PM.  . [DISCONTINUED] budesonide (ENTOCORT EC) 3 MG 24 hr capsule Take 1 Tablet (3MG)  By Mouth Every Other Day.  . [DISCONTINUED] metoprolol tartrate (LOPRESSOR) 25 MG tablet Take 1 tablet (25 mg total) by mouth daily.   Allergies  Allergen Reactions  . Adhesive [Tape] Other (See Comments)    Severe skin breakdown;paper tape   . Lactase Diarrhea  . Lipitor [Atorvastatin] Swelling    Swelling and leg pain  . Neomycin Other (See Comments)    Nausea    . Pollen Extract Other (See Comments)  . Levaquin [Levofloxacin In D5w] Nausea And Vomiting    Lightheadedness    Recent Results (from the past 2160 hour(s))  Hepatitis B surface antibody,quantitative     Status: None   Collection Time: 12/19/19  9:31 AM  Result Value Ref Range   Hepatitis B-Post 195 > OR = 10 mIU/mL    Comment: . Patient has immunity to hepatitis B virus. . For additional information, please refer to http://education.questdiagn[facility].com/faq/FAQ105 (This link is being provided for informational/ educational purposes only).   CBC w/Diff     Status: None   Collection Time: 12/19/19  9:31 AM  Result Value Ref Range   WBC 5.8 4.0 - 10.5 K/uL   RBC 4.43 3.87 - 5.11 Mil/uL   Hemoglobin 14.3 12.0 - 15.0 g/dL   HCT 42.2 36.0 - 46.0 %   MCV 95.3 78.0 - 100.0 fl   MCHC 33.9 30.0 - 36.0 g/dL   RDW 13.2 11.5 - 15.5 %   Platelets 251.0 150.0 - 400.0 K/uL   Neutrophils Relative % 53.2 43.0 - 77.0 %   Lymphocytes Relative 34.9 12.0 - 46.0 %   Monocytes Relative 9.1 3.0 - 12.0 %   Eosinophils Relative 1.6 0.0 - 5.0 %   Basophils Relative 1.2 0.0 - 3.0 %   Neutro Abs 3.1 1.4 - 7.7 K/uL   Lymphs Abs 2.0 0.7 - 4.0 K/uL   Monocytes Absolute 0.5 0.1 - 1.0 K/uL   Eosinophils Absolute 0.1 0.0 - 0.7 K/uL   Basophils Absolute 0.1 0.0 - 0.1 K/uL  Hemoglobin A1c     Status: Abnormal   Collection Time: 12/19/19  9:31 AM  Result Value Ref Range    Hgb A1c MFr Bld 6.9 (H) 4.6 - 6.5 %    Comment: Glycemic Control Guidelines for People with Diabetes:Non Diabetic:  <6%Goal of Therapy: <7%Additional Action Suggested:  >8%   Lipid panel     Status: None   Collection Time: 12/19/19  9:31 AM  Result Value Ref Range   Cholesterol 139 0 - 200 mg/dL    Comment: ATP III Classification       Desirable:  < 200 mg/dL               Borderline High:  200 - 239 mg/dL          High:  > = 240 mg/dL   Triglycerides 148.0 0.0 - 149.0 mg/dL    Comment: Normal:  <150 mg/dLBorderline High:  150 - 199 mg/dL   HDL 48.00 >39.00 mg/dL   VLDL 29.6 0.0 - 40.0 mg/dL   LDL Cholesterol 61 0 - 99 mg/dL   Total CHOL/HDL Ratio 3     Comment:                Men          Women1/2 Average Risk     3.4          3.3Average Risk          5.0  4.42X Average Risk          9.6          7.13X Average Risk          15.0          11.0                       NonHDL 90.78     Comment: NOTE:  Non-HDL goal should be 30 mg/dL higher than patient's LDL goal (i.e. LDL goal of < 70 mg/dL, would have non-HDL goal of < 100 mg/dL)  Comprehensive metabolic panel     Status: Abnormal   Collection Time: 12/19/19  9:31 AM  Result Value Ref Range   Sodium 136 135 - 145 mEq/L   Potassium 4.4 3.5 - 5.1 mEq/L   Chloride 100 96 - 112 mEq/L   CO2 28 19 - 32 mEq/L   Glucose, Bld 134 (H) 70 - 99 mg/dL   BUN 15 6 - 23 mg/dL   Creatinine, Ser 0.87 0.40 - 1.20 mg/dL   Total Bilirubin 0.5 0.2 - 1.2 mg/dL   Alkaline Phosphatase 58 39 - 117 U/L   AST 39 (H) 0 - 37 U/L   ALT 44 (H) 0 - 35 U/L   Total Protein 7.3 6.0 - 8.3 g/dL   Albumin 4.5 3.5 - 5.2 g/dL   GFR 65.49 >60.00 mL/min   Calcium 10.1 8.4 - 10.5 mg/dL   Objective  Body mass index is 30.05 kg/m. Wt Readings from Last 3 Encounters:  12/26/19 143 lb 12.8 oz (65.2 kg)  08/12/19 147 lb (66.7 kg)  07/18/19 147 lb 6.4 oz (66.9 kg)   Temp Readings from Last 3 Encounters:  12/26/19 (!) 97.2 F (36.2 C) (Temporal)  07/18/19 97.7 F  (36.5 C) (Oral)  08/29/18 99.3 F (37.4 C) (Oral)   BP Readings from Last 3 Encounters:  12/26/19 114/70  08/12/19 (!) 152/92  07/18/19 122/76   Pulse Readings from Last 3 Encounters:  12/26/19 85  08/12/19 74  07/18/19 89    Physical Exam Vitals and nursing note reviewed.  Constitutional:      Appearance: Normal appearance. She is well-developed and well-groomed. She is obese.  HENT:     Head: Normocephalic and atraumatic.  Eyes:     Conjunctiva/sclera: Conjunctivae normal.     Pupils: Pupils are equal, round, and reactive to light.  Cardiovascular:     Rate and Rhythm: Normal rate and regular rhythm.     Heart sounds: Normal heart sounds. No murmur.  Pulmonary:     Effort: Pulmonary effort is normal.     Breath sounds: Normal breath sounds.  Abdominal:     General: Abdomen is flat. Bowel sounds are normal.     Tenderness: There is no abdominal tenderness.  Skin:    General: Skin is warm and dry.  Neurological:     General: No focal deficit present.     Mental Status: She is alert and oriented to person, place, and time. Mental status is at baseline.     Gait: Gait normal.  Psychiatric:        Attention and Perception: Attention and perception normal.        Mood and Affect: Mood and affect normal.        Speech: Speech normal.        Behavior: Behavior normal. Behavior is cooperative.        Thought Content: Thought content normal.  Cognition and Memory: Cognition and memory normal.        Judgment: Judgment normal.     Assessment  Plan  Chronic pain of right knee - Plan: DG Knee Complete 4 Views Right negative  US Venous Img Lower Unilateral Right r/o bakers cyst  Essential hypertension - Plan: metoprolol tartrate (LOPRESSOR) 25 MG tablet, Comprehensive metabolic panel, Lipid panel, CBC with Differential/Platelet Cont other meds  Collagenous colitis - Plan: budesonide (ENTOCORT EC) 3 MG 24 hr capsule taking 9 mg qd now 3 mg qod  Will see when due to  f/u with Dr. Allen Norris   Anxiety Prn Valium   Type 2 diabetes mellitus without complication, without long-term current use of insulin ([place]) - Plan: Hemoglobin A1c was 6.9  Repeat a1c in future  Foot exam today some clavi normal monofilament  Eye exam sch 01/2020   [facility] -CPE pt wants in 06/2020 Flu shot had10/2/20 in office Tdap utd  Consider shingrix in future ? If will get this twinrix3/3had hep B immune  covid vaccine thinking about it   mammo 06/03/19 neg ordered  DEXA 06/11/18 osteopenia Colonoscopy [facility] 08/12/15 IH and + collagenous colitis -will rec pt f/u with GI for these medications budesonide in future. Also h/o colon polyps  Pap negative 07/18/19 dermatology tbsesch [place] SCC scalp Bethanie Dicker 06/2019 f/u 06/2020   Former smoker quit 2015 total 16 years max 1.5 ppdno FH lung cancer  -CT chest had 05/2018, 07/16/19 stable  Quit smoking in 2015  Last eye exam 2019 [facility] 01/2020  rec healthy diet and exercise  Provider: Dr. Olivia Mackie McLean-Scocuzza-Internal Medicine

## 2019-12-26 NOTE — Patient Instructions (Addendum)
COVID-19 Vaccine Information can be found at: [email or web address] For questions related to vaccine distribution or appointments, please email [email or web address] or call [phone].   Debrox ear wax drops     Hypoglycemia Hypoglycemia occurs when the level of sugar (glucose) in the blood is too low. Hypoglycemia can happen in people who do or do not have diabetes. It can develop quickly, and it can be a medical emergency. For most people with diabetes, a blood glucose level below 70 mg/dL (3.9 mmol/L) is considered hypoglycemia. Glucose is a type of sugar that provides the body's main source of energy. Certain hormones (insulin and glucagon) control the level of glucose in the blood. Insulin lowers blood glucose, and glucagon raises blood glucose. Hypoglycemia can result from having too much insulin in the bloodstream, or from not eating enough food that contains glucose. You may also have reactive hypoglycemia, which happens within 4 hours after eating a meal. What are the causes? Hypoglycemia occurs most often in people who have diabetes and may be caused by:  Diabetes medicine.  Not eating enough, or not eating often enough.  Increased physical activity.  Drinking alcohol on an empty stomach. If you do not have diabetes, hypoglycemia may be caused by:  A tumor in the pancreas.  Not eating enough, or not eating for long periods at a time (fasting).  A severe infection or illness.  Certain medicines. What increases the risk? Hypoglycemia is more likely to develop in:  People who have diabetes and take medicines to lower blood glucose.  People who abuse alcohol.  People who have a severe illness. What are the signs or symptoms? Mild symptoms Mild hypoglycemia may not cause any symptoms. If you do have symptoms, they may include:  Hunger.  Anxiety.  Sweating and feeling clammy.  Dizziness or feeling  light-headed.  Sleepiness.  Nausea.  Increased heart rate.  Headache.  Blurry vision.  Irritability.  Tingling or numbness around the mouth, lips, or tongue.  A change in coordination.  Restless sleep. Moderate symptoms Moderate hypoglycemia can cause:  Mental confusion and poor judgment.  Behavior changes.  Weakness.  Irregular heartbeat. Severe symptoms Severe hypoglycemia is a medical emergency. It can cause:  Fainting.  Seizures.  Loss of consciousness (coma).  Death. How is this diagnosed? Hypoglycemia is diagnosed with a blood test to measure your blood glucose level. This blood test is done while you are having symptoms. Your health care provider may also do a physical exam and review your medical history. How is this treated? This condition can often be treated by immediately eating or drinking something that contains sugar, such as:  Fruit juice, 4-6 oz (120-150 mL).  Regular soda (not diet soda), 4-6 oz (120-150 mL).  Low-fat milk, 4 oz (120 mL).  Several pieces of hard candy.  Sugar or honey, 1 Tbsp (15 mL). Treating hypoglycemia if you have diabetes If you are alert and able to swallow safely, follow the 15:15 rule:  Take 15 grams of a rapid-acting carbohydrate. Talk with your health care provider about how much you should take.  Rapid-acting options include: ? Glucose pills (take 15 grams). ? 6-8 pieces of hard candy. ? 4-6 oz (120-150 mL) of fruit juice. ? 4-6 oz (120-150 mL) of regular (not diet) soda. ? 1 Tbsp (15 mL) honey or sugar.  Check your blood glucose 15 minutes after you take the carbohydrate.  If the repeat blood glucose level is still at or below 70 mg/dL (3.9 mmol/L), take 15 grams  of a carbohydrate again.  If your blood glucose level does not increase above 70 mg/dL (3.9 mmol/L) after 3 tries, seek emergency medical care.  After your blood glucose level returns to normal, eat a meal or a snack within 1  hour.  Treating severe hypoglycemia Severe hypoglycemia is when your blood glucose level is at or below 54 mg/dL (3 mmol/L). Severe hypoglycemia is a medical emergency. Get medical help right away. If you have severe hypoglycemia and you cannot eat or drink, you may need an injection of glucagon. A family member or close friend should learn how to check your blood glucose and how to give you a glucagon injection. Ask your health care provider if you need to have an emergency glucagon injection kit available. Severe hypoglycemia may need to be treated in a hospital. The treatment may include getting glucose through an IV. You may also need treatment for the cause of your hypoglycemia. Follow these instructions at home:  General instructions  Take over-the-counter and prescription medicines only as told by your health care provider.  Monitor your blood glucose as told by your health care provider.  Limit alcohol intake to no more than 1 drink a day for nonpregnant women and 2 drinks a day for men. One drink equals 12 oz of beer (355 mL), 5 oz of wine (148 mL), or 1 oz of hard liquor (44 mL).  Keep all follow-up visits as told by your health care provider. This is important. If you have diabetes:  Always have a rapid-acting carbohydrate snack with you to treat low blood glucose.  Follow your diabetes management plan as directed. Make sure you: ? Know the symptoms of hypoglycemia. It is important to treat it right away to prevent it from becoming severe. ? Take your medicines as directed. ? Follow your exercise plan. ? Follow your meal plan. Eat on time, and do not skip meals. ? Check your blood glucose as often as directed. Always check before and after exercise. ? Follow your sick day plan whenever you cannot eat or drink normally. Make this plan in advance with your health care provider.  Share your diabetes management plan with people in your workplace, school, and household.  Check  your urine for ketones when you are ill and as told by your health care provider.  Carry a medical alert card or wear medical alert jewelry. Contact a health care provider if:  You have problems keeping your blood glucose in your target range.  You have frequent episodes of hypoglycemia. Get help right away if:  You continue to have hypoglycemia symptoms after eating or drinking something containing glucose.  Your blood glucose is at or below 54 mg/dL (3 mmol/L).  You have a seizure.  You faint. These symptoms may represent a serious problem that is an emergency. Do not wait to see if the symptoms will go away. Get medical help right away. Call your local emergency services (911 in the [place] Summary  Hypoglycemia occurs when the level of sugar (glucose) in the blood is too low.  Hypoglycemia can happen in people who do or do not have diabetes. It can develop quickly, and it can be a medical emergency.  Make sure you know the symptoms of hypoglycemia and how to treat it.  Always have a rapid-acting carbohydrate snack with you to treat low blood sugar. This information is not intended to replace advice given to you by your health care provider. Make sure you discuss any questions  you have with your health care provider. Document Revised: 03/25/2018 Document Reviewed: 11/05/2015 Elsevier Patient Education  2020 [employer].  Nonalcoholic Fatty Liver Disease Diet, Adult Nonalcoholic fatty liver disease is a condition that causes fat to build up in and around the liver. The disease makes it harder for the liver to work the way that it should. Following a healthy diet can help to keep nonalcoholic fatty liver disease under control. It can also help to prevent or improve conditions that are associated with the disease, such as heart disease, diabetes, high blood pressure, and abnormal cholesterol levels. Along with regular exercise, this diet:  Promotes weight loss.  Helps to control  blood sugar levels.  Helps to improve the way that the body uses insulin. What are tips for following this plan? Reading food labels Always check food labels for:  The amount of saturated fat in a food. You should limit your intake of saturated fat. Saturated fat is found in foods that come from animals, including meat and dairy products such as butter, cheese, and whole milk.  The amount of fiber in a food. You should choose high-fiber foods such as fruits, vegetables, and whole grains. Try to get 25-30 grams (g) of fiber a day.  Cooking  When cooking, use heart-healthy oils that are high in monounsaturated fats. These include olive oil, canola oil, and avocado oil.  Limit frying or deep-frying foods. Cook foods using healthy methods such as baking, boiling, steaming, and grilling instead. Meal planning  You may want to keep track of how many calories you take in. Eating the right amount of calories will help you achieve a healthy weight. Meeting with a registered dietitian can help you get started.  Limit how often you eat takeout and fast food. These foods are usually very high in fat, salt, and sugar.  Use the glycemic index (GI) to plan your meals. The index tells you how quickly a food will raise your blood sugar. Choose low-GI foods (GI less than 55). These foods take a longer time to raise blood sugar. A registered dietitian can help you identify foods lower on the GI scale. Lifestyle  You may want to follow a Mediterranean diet. This diet includes a lot of vegetables, lean meats or fish, whole grains, fruits, and healthy oils and fats. What foods can I eat?  Fruits Bananas. Apples. Oranges. Grapes. Papaya. Mango. Pomegranate. Kiwi. Grapefruit. Cherries. Vegetables Lettuce. Spinach. Peas. Beets. Cauliflower. Cabbage. Broccoli. Carrots. Tomatoes. Squash. Eggplant. Herbs. Peppers. Onions. Cucumbers. Brussels sprouts. Yams and sweet potatoes. Beans. Lentils. Grains Whole wheat  or whole-grain foods, including breads, crackers, cereals, and pasta. Stone-ground whole wheat. Unsweetened oatmeal. Bulgur. Barley. Quinoa. Brown or wild rice. Corn or whole wheat flour tortillas. Meats and other proteins Lean meats. Poultry. Tofu. Seafood and shellfish. Dairy Low-fat or fat-free dairy products, such as yogurt, cottage cheese, or cheese. Beverages Water. Sugar-free drinks. Tea. Coffee. Low-fat or skim milk. Milk alternatives, such as soy or almond milk. Real fruit juice. Fats and oils Avocado. Canola or olive oil. Nuts and nut butters. Seeds. Seasonings and condiments Mustard. Relish. Low-fat, low-sugar ketchup and barbecue sauce. Low-fat or fat-free mayonnaise. Sweets and desserts Sugar-free sweets. The items listed above may not be a complete list of foods and beverages you can eat. Contact a dietitian for more information. What foods should I limit or avoid? Meats and other proteins Limit red meat to 1-2 times a week. Dairy [employer]. Fats and oils Palm oil and coconut  oil. Fried foods. Other foods Processed foods. Foods that contain a lot of salt or sodium. Sweets and desserts Sweets that contain sugar. Beverages Sweetened drinks, such as sweet tea, milkshakes, iced sweet drinks, and sodas. Alcohol. The items listed above may not be a complete list of foods and beverages you should avoid. Contact a dietitian for more information. Where to find more information The [employer] of Diabetes and Digestive and Kidney Diseases: [employer] Summary  Nonalcoholic fatty liver disease is a condition that causes fat to build up in and around the liver.  Following a healthy diet can help to keep nonalcoholic fatty liver disease under control. Your diet should be rich in fruits, vegetables, whole grains, and lean proteins.  Limit your intake of saturated fat. Saturated fat is found in foods that come from animals, including meat and dairy products such as  butter, cheese, and whole milk.  This diet promotes weight loss, helps to control blood sugar levels, and helps to improve the way that the body uses insulin. This information is not intended to replace advice given to you by your health care provider. Make sure you discuss any questions you have with your health care provider. Document Revised: 01/24/2019 Document Reviewed: 10/24/2018 Elsevier Patient Education  [place].  Fatty Liver Disease  Fatty liver disease occurs when too much fat has built up in your liver cells. Fatty liver disease is also called hepatic steatosis or steatohepatitis. The liver removes harmful substances from your bloodstream and produces fluids that your body needs. It also helps your body use and store energy from the food you eat. In many cases, fatty liver disease does not cause symptoms or problems. It is often diagnosed when tests are being done for other reasons. However, over time, fatty liver can cause inflammation that may lead to more serious liver problems, such as scarring of the liver (cirrhosis) and liver failure. Fatty liver is associated with insulin resistance, increased body fat, high blood pressure (hypertension), and high cholesterol. These are features of metabolic syndrome and increase your risk for stroke, diabetes, and heart disease. What are the causes? This condition may be caused by:  Drinking too much alcohol.  Poor nutrition.  Obesity.  Cushing's syndrome.  Diabetes.  High cholesterol.  Certain drugs.  Poisons.  Some viral infections.  Pregnancy. What increases the risk? You are more likely to develop this condition if you:  Abuse alcohol.  Are overweight.  Have diabetes.  Have hepatitis.  Have a high triglyceride level.  Are pregnant. What are the signs or symptoms? Fatty liver disease often does not cause symptoms. If symptoms do develop, they can include:  Fatigue.  Weakness.  Weight  loss.  Confusion.  Abdominal pain.  Nausea and vomiting.  Yellowing of your skin and the white parts of your eyes (jaundice).  Itchy skin. How is this diagnosed? This condition may be diagnosed by:  A physical exam and medical history.  Blood tests.  Imaging tests, such as an ultrasound, CT scan, or MRI.  A liver biopsy. A small sample of liver tissue is removed using a needle. The sample is then looked at under a microscope. How is this treated? Fatty liver disease is often caused by other health conditions. Treatment for fatty liver may involve medicines and lifestyle changes to manage conditions such as:  Alcoholism.  High cholesterol.  Diabetes.  Being overweight or obese. Follow these instructions at home:   Do not drink alcohol. If you have trouble  quitting, ask your health care provider how to safely quit with the help of medicine or a supervised program. This is important to keep your condition from getting worse.  Eat a healthy diet as told by your health care provider. Ask your health care provider about working with a diet and nutrition specialist (dietitian) to develop an eating plan.  Exercise regularly. This can help you lose weight and control your cholesterol and diabetes. Talk to your health care provider about an exercise plan and which activities are best for you.  Take over-the-counter and prescription medicines only as told by your health care provider.  Keep all follow-up visits as told by your health care provider. This is important. Contact a health care provider if: You have trouble controlling your:  Blood sugar. This is especially important if you have diabetes.  Cholesterol.  Drinking of alcohol. Get help right away if:  You have abdominal pain.  You have jaundice.  You have nausea and vomiting.  You vomit blood or material that looks like coffee grounds.  You have stools that are black, tar-like, or bloody. Summary  Fatty  liver disease develops when too much fat builds up in the cells of your liver.  Fatty liver disease often causes no symptoms or problems. However, over time, fatty liver can cause inflammation that may lead to more serious liver problems, such as scarring of the liver (cirrhosis).  You are more likely to develop this condition if you abuse alcohol, are pregnant, are overweight, have diabetes, have hepatitis, or have high triglyceride levels.  Contact your health care provider if you have trouble controlling your weight, blood sugar, cholesterol, or drinking of alcohol. This information is not intended to replace advice given to you by your health care provider. Make sure you discuss any questions you have with your health care provider. Document Revised: 09/14/2017 Document Reviewed: 07/11/2017 Elsevier Patient Education  2020 [employer].

## 2019-12-31 DIAGNOSIS — M25561 Pain in right knee: Secondary | ICD-10-CM | POA: Insufficient documentation

## 2019-12-31 DIAGNOSIS — G8929 Other chronic pain: Secondary | ICD-10-CM | POA: Insufficient documentation

## 2019-12-31 DIAGNOSIS — F419 Anxiety disorder, unspecified: Secondary | ICD-10-CM | POA: Insufficient documentation

## 2020-01-01 ENCOUNTER — Ambulatory Visit: Payer: BC Managed Care – PPO

## 2020-01-01 NOTE — Progress Notes (Signed)
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  Some people have flares once a year, once every 5 years, continuous flares or never being bothered with it again.  I typically do not bring patients in when they are asymptomatic to discuss their collagenous colitis.  It also does not increase risk of colon cancer and they are on the usual 10-year colonoscopy schedule and her next one would be due in 2026. I will guarantee you that I have never told any patient that they should get there GI medications from their primary care provider and we have no problem refilling her budesonide. Thank you for bringing this to our attention and we will contact her to refill her budesonide.  Angles Trevizo

## 2020-01-06 ENCOUNTER — Telehealth: Payer: Self-pay | Admitting: Internal Medicine

## 2020-01-06 DIAGNOSIS — K52831 Collagenous colitis: Secondary | ICD-10-CM

## 2020-01-06 NOTE — Telephone Encounter (Signed)
Pt's pharmacy asking for clarification on how patient should take her Budesonide. Sig is currently:  Sig: Take 3 capsules (9 mg total) by mouth daily. Take 1 Tablet (3MG ) By Mouth Every Other Day.  Please advise

## 2020-01-09 NOTE — Telephone Encounter (Signed)
Call [facility]   3 capsules (9mg ) daily   [place]

## 2020-01-12 MED ORDER — BUDESONIDE 3 MG PO CPEP: 9.0000 mg | ORAL_CAPSULE | Freq: Every day | ORAL | 3 refills | Status: DC

## 2020-01-12 NOTE — Telephone Encounter (Signed)
Medication sent in to preferred pharmacy per protocol.   

## 2020-01-14 ENCOUNTER — Ambulatory Visit: Payer: BC Managed Care – PPO

## 2020-01-15 ENCOUNTER — Ambulatory Visit
Admission: RE | Admit: 2020-01-15 | Discharge: 2020-01-15 | Disposition: A | Discharge status: Home / Self Care | Payer: BC Managed Care – PPO | Source: Ambulatory Visit | Attending: Internal Medicine | Admitting: Internal Medicine

## 2020-01-15 ENCOUNTER — Other Ambulatory Visit: Payer: Self-pay

## 2020-01-15 ENCOUNTER — Telehealth: Payer: Self-pay | Admitting: Internal Medicine

## 2020-01-15 DIAGNOSIS — M7121 Synovial cyst of popliteal space [Baker], right knee: Secondary | ICD-10-CM | POA: Diagnosis present

## 2020-01-15 DIAGNOSIS — R7989 Other specified abnormal findings of blood chemistry: Secondary | ICD-10-CM

## 2020-01-15 DIAGNOSIS — M25561 Pain in right knee: Secondary | ICD-10-CM | POA: Diagnosis present

## 2020-01-15 DIAGNOSIS — G8929 Other chronic pain: Secondary | ICD-10-CM | POA: Insufficient documentation

## 2020-01-15 DIAGNOSIS — K760 Fatty (change of) liver, not elsewhere classified: Secondary | ICD-10-CM | POA: Diagnosis not present

## 2020-01-15 NOTE — Telephone Encounter (Signed)
Pt was reading up on fatty liver and was wondering if simvastatin effect her liver?

## 2020-01-18 NOTE — Telephone Encounter (Signed)
Fatty liver can make liver enzymes elevated as well as this can be side effect of statin cholesterol medications I.e simvastatin   For fatty liver we want to control diabetes, cholesterol and weight   Her cholesterol is improved and diabetes improved and statin is indicated for her history of diabetes and elevated cholesterol   Rec healthy diet and exercise as well    [place]

## 2020-01-19 NOTE — Telephone Encounter (Signed)
Patient informed and verbalized understanding

## 2020-02-03 LAB — HM DIABETES EYE EXAM

## 2020-02-06 ENCOUNTER — Encounter: Payer: Self-pay | Admitting: Internal Medicine

## 2020-02-13 ENCOUNTER — Encounter: Payer: Self-pay | Admitting: Internal Medicine

## 2020-04-07 ENCOUNTER — Other Ambulatory Visit: Payer: Self-pay

## 2020-04-07 MED ORDER — LISINOPRIL 20 MG PO TABS: 20.0000 mg | ORAL_TABLET | Freq: Every day | ORAL | 2 refills | Status: DC

## 2020-06-03 ENCOUNTER — Ambulatory Visit
Admission: RE | Admit: 2020-06-03 | Discharge: 2020-06-03 | Disposition: A | Discharge status: Home / Self Care | Payer: BC Managed Care – PPO | Source: Ambulatory Visit | Attending: Internal Medicine | Admitting: Internal Medicine

## 2020-06-03 ENCOUNTER — Other Ambulatory Visit: Payer: Self-pay

## 2020-06-03 DIAGNOSIS — Z1231 Encounter for screening mammogram for malignant neoplasm of breast: Secondary | ICD-10-CM | POA: Diagnosis not present

## 2020-06-18 ENCOUNTER — Other Ambulatory Visit: Payer: Self-pay

## 2020-06-18 ENCOUNTER — Other Ambulatory Visit (INDEPENDENT_AMBULATORY_CARE_PROVIDER_SITE_OTHER): Payer: BC Managed Care – PPO

## 2020-06-18 DIAGNOSIS — I10 Essential (primary) hypertension: Secondary | ICD-10-CM | POA: Diagnosis not present

## 2020-06-18 DIAGNOSIS — E119 Type 2 diabetes mellitus without complications: Secondary | ICD-10-CM | POA: Diagnosis not present

## 2020-06-18 LAB — CBC WITH DIFFERENTIAL/PLATELET
Basophils Absolute: 0.1 10*3/uL (ref 0.0–0.1)
Basophils Relative: 1.1 % (ref 0.0–3.0)
Eosinophils Absolute: 0.1 10*3/uL (ref 0.0–0.7)
Eosinophils Relative: 1.5 % (ref 0.0–5.0)
HCT: 42.7 % (ref 36.0–46.0)
Hemoglobin: 14.5 g/dL (ref 12.0–15.0)
Lymphocytes Relative: 31.9 % (ref 12.0–46.0)
Lymphs Abs: 1.6 10*3/uL (ref 0.7–4.0)
MCHC: 34 g/dL (ref 30.0–36.0)
MCV: 94 fl (ref 78.0–100.0)
Monocytes Absolute: 0.4 10*3/uL (ref 0.1–1.0)
Monocytes Relative: 7.9 % (ref 3.0–12.0)
Neutro Abs: 2.9 10*3/uL (ref 1.4–7.7)
Neutrophils Relative %: 57.6 % (ref 43.0–77.0)
Platelets: 252 10*3/uL (ref 150.0–400.0)
RBC: 4.54 Mil/uL (ref 3.87–5.11)
RDW: 13.3 % (ref 11.5–15.5)
WBC: 5.1 10*3/uL (ref 4.0–10.5)

## 2020-06-18 LAB — COMPREHENSIVE METABOLIC PANEL
ALT: 43 U/L — ABNORMAL HIGH (ref 0–35)
AST: 36 U/L (ref 0–37)
Albumin: 4.7 g/dL (ref 3.5–5.2)
Alkaline Phosphatase: 49 U/L (ref 39–117)
BUN: 15 mg/dL (ref 6–23)
CO2: 30 mEq/L (ref 19–32)
Calcium: 9.9 mg/dL (ref 8.4–10.5)
Chloride: 99 mEq/L (ref 96–112)
Creatinine, Ser: 0.9 mg/dL (ref 0.40–1.20)
GFR: 62.88 mL/min (ref >60.00)
Glucose, Bld: 107 mg/dL — ABNORMAL HIGH (ref 70–99)
Potassium: 4.3 mEq/L (ref 3.5–5.1)
Sodium: 138 mEq/L (ref 135–145)
Total Bilirubin: 0.7 mg/dL (ref 0.2–1.2)
Total Protein: 6.7 g/dL (ref 6.0–8.3)

## 2020-06-18 LAB — HEMOGLOBIN A1C: Hgb A1c MFr Bld: 6.4 % (ref 4.6–6.5)

## 2020-06-18 LAB — LIPID PANEL
Cholesterol: 124 mg/dL (ref 0–200)
HDL: 44.2 mg/dL (ref >39.00)
LDL Cholesterol: 56 mg/dL (ref 0–99)
NonHDL: 79.85
Total CHOL/HDL Ratio: 3
Triglycerides: 121 mg/dL (ref 0.0–149.0)
VLDL: 24.2 mg/dL (ref 0.0–40.0)

## 2020-06-22 ENCOUNTER — Encounter: Payer: BC Managed Care – PPO | Admitting: Internal Medicine

## 2020-06-23 ENCOUNTER — Ambulatory Visit (INDEPENDENT_AMBULATORY_CARE_PROVIDER_SITE_OTHER): Payer: BC Managed Care – PPO | Admitting: Internal Medicine

## 2020-06-23 ENCOUNTER — Other Ambulatory Visit: Payer: Self-pay

## 2020-06-23 ENCOUNTER — Encounter: Payer: Self-pay | Admitting: Internal Medicine

## 2020-06-23 VITALS — BP 124/82 | HR 86 | Temp 98.9°F | Ht <= 58 in | Wt 137.0 lb

## 2020-06-23 DIAGNOSIS — E785 Hyperlipidemia, unspecified: Secondary | ICD-10-CM

## 2020-06-23 DIAGNOSIS — Z0000 Encounter for general adult medical examination without abnormal findings: Secondary | ICD-10-CM

## 2020-06-23 DIAGNOSIS — Z1389 Encounter for screening for other disorder: Secondary | ICD-10-CM

## 2020-06-23 DIAGNOSIS — Z122 Encounter for screening for malignant neoplasm of respiratory organs: Secondary | ICD-10-CM

## 2020-06-23 DIAGNOSIS — E663 Overweight: Secondary | ICD-10-CM | POA: Insufficient documentation

## 2020-06-23 DIAGNOSIS — Z1329 Encounter for screening for other suspected endocrine disorder: Secondary | ICD-10-CM

## 2020-06-23 DIAGNOSIS — Z23 Encounter for immunization: Secondary | ICD-10-CM | POA: Diagnosis not present

## 2020-06-23 DIAGNOSIS — H6123 Impacted cerumen, bilateral: Secondary | ICD-10-CM | POA: Diagnosis not present

## 2020-06-23 DIAGNOSIS — F419 Anxiety disorder, unspecified: Secondary | ICD-10-CM

## 2020-06-23 DIAGNOSIS — R7303 Prediabetes: Secondary | ICD-10-CM

## 2020-06-23 DIAGNOSIS — I10 Essential (primary) hypertension: Secondary | ICD-10-CM

## 2020-06-23 MED ORDER — MICROLET LANCETS MISC: 3 refills | Status: DC

## 2020-06-23 MED ORDER — CONTOUR NEXT TEST VI STRP: ORAL_STRIP | 3 refills | Status: DC

## 2020-06-23 MED ORDER — DIAZEPAM 5 MG PO TABS: 5.0000 mg | ORAL_TABLET | Freq: Two times a day (BID) | ORAL | 2 refills | Status: DC | PRN

## 2020-06-23 MED ORDER — LISINOPRIL 20 MG PO TABS: 20.0000 mg | ORAL_TABLET | Freq: Every day | ORAL | 3 refills | Status: DC

## 2020-06-23 MED ORDER — SIMVASTATIN 20 MG PO TABS: 20.0000 mg | ORAL_TABLET | Freq: Every day | ORAL | 3 refills | Status: DC

## 2020-06-23 NOTE — Progress Notes (Signed)
Chief Complaint  Patient presents with  . Annual Exam  . Immunizations    FLU SHOT   Annual  1. HTN controlled on lopressor 25 and lis 20 mg qd and hld on zocor needs refill  2. Anxiety stable with valium 5 mg qd prn  3. C/o hair loss disc with derm Dr. Kellie Moor 05/2020  4. Overweight had weight loss with exercise and diet changes and trying to lose home scales 135# goal 110-125 she is walking 1/2 mile daily at times  5. 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  HENT: Negative for hearing loss.   Eyes: Negative for blurred vision.  Respiratory: Negative for shortness of breath.   Cardiovascular: Negative for chest pain.  Gastrointestinal: Negative for abdominal pain.  Musculoskeletal: Negative for falls.  Skin: Negative for rash.  Neurological: Negative for headaches.  Psychiatric/Behavioral: Negative for depression. The patient is nervous/anxious.    Past Medical History:  Diagnosis Date  . Allergy   . Anxiety   . Chicken pox   . Collagenous colitis    Dr. Allen Norris   . Colon polyps   . DDD (degenerative disc disease), cervical    had PT in the past, noted imaging 05/31/10   . Depression   . Fatty liver   . Ganglion cyst of wrist, left   . Hemorrhoids   . Hemorrhoids   . History of colon polyps   . History of prediabetes   . Hyperlipidemia 07/06/2015  . Hypertension   . IBS (irritable bowel syndrome) 07/06/2015  . NS (neck stiffness)    xray shows something at "C3" approx 4 yrs ago. Had PT. Helped.  . Polycythemia, secondary 07/06/2015  . Prediabetes   . SCC (squamous cell carcinoma)    scalp s/p mohs 11/07/2018 [facility] Dr. Manley Mason  . Urinary incontinence   . Vitamin D deficiency 07/06/2015   Past Surgical History:  Procedure Laterality Date  . COLONOSCOPY  2006  . COLONOSCOPY WITH PROPOFOL N/A 08/12/2015   Procedure: COLONOSCOPY WITH PROPOFOL;  Surgeon: [name], MD;   Location: [facility];  Service: Endoscopy;  Laterality: N/A;  PT WOULD LIKE EARLY AM PER JS  . EYE SURGERY     right eye 05/17/20 and 06/07/20 Dr. Valma Cava my eye MD f/u 06/2020  . HYSTEROSALPINGOGRAM     1983  . TONSILLECTOMY AND ADENOIDECTOMY     age [age]   Family History  Problem Relation Age of Onset  . Breast cancer Maternal Grandmother 60  . Cancer Maternal Grandmother        breast  . Hypertension Father   . Hyperlipidemia Father   . COPD Father   . Hearing loss Father   . Heart disease Father   . Heart attack Father   . Diabetes Mother   . Hyperlipidemia Mother   . Stroke Mother   . Thyroid disease Daughter   . Heart disease Maternal Grandfather   . Heart attack Maternal Grandfather   . Heart disease Paternal Grandfather   . Heart attack Paternal Grandfather   . Osteoporosis Other   . Heart disease Paternal Grandmother    Social History   Socioeconomic History  . Marital status: Married    Spouse name: Not on file  . Number of children: Not on file  . Years of education: Not on file  . Highest education level: Not on file  Occupational History  .  Not on file  Tobacco Use  . Smoking status: Former Smoker    Packs/day: 1.25    Years: 42.00    Pack years: 52.50    Quit date: 04/03/2014    Years since quitting: 6.2  . Smokeless tobacco: Never Used  Substance and Sexual Activity  . Alcohol use: Yes    Alcohol/week: 0.0 standard drinks    Comment: wine daily  . Drug use: No  . Sexual activity: Not Currently  Other Topics Concern  . Not on file  Social History Narrative   Married    Some college, retired    2 daughters    No guns    Wears seat belt    Safe in relationship    Social Determinants of Radio broadcast assistant Strain:   . Difficulty of Paying Living Expenses: Not on file  Food Insecurity:   . Worried About Charity fundraiser in the Last Year: Not on file  . Ran Out of Food in the Last Year: Not on file  Transportation Needs:   .  Lack of Transportation (Medical): Not on file  . Lack of Transportation (Non-Medical): Not on file  Physical Activity:   . Days of Exercise per Week: Not on file  . Minutes of Exercise per Session: Not on file  Stress:   . Feeling of Stress : Not on file  Social Connections:   . Frequency of Communication with Friends and Family: Not on file  . Frequency of Social Gatherings with Friends and Family: Not on file  . Attends Religious Services: Not on file  . Active Member of Clubs or Organizations: Not on file  . Attends Archivist Meetings: Not on file  . Marital Status: Not on file  Intimate Partner Violence:   . Fear of Current or Ex-Partner: Not on file  . Emotionally Abused: Not on file  . Physically Abused: Not on file  . Sexually Abused: Not on file   Current Meds  Medication Sig  . aspirin 81 MG tablet Take 81 mg by mouth every other day.   . budesonide (ENTOCORT EC) 3 MG 24 hr capsule Take 3 capsules (9 mg total) by mouth daily.  . calcium carbonate (CALCIUM 600) 600 MG TABS tablet Calcium 600 + D(3) 600 mg (1,500 mg)-400 unit tablet  . Cholecalciferol (VITAMIN D) 2000 units CAPS Vitamin D3 2,000 unit capsule   1 capsule every day by oral route.  . CONTOUR NEXT TEST test strip   . diazepam (VALIUM) 5 MG tablet Take 1 tablet (5 mg total) by mouth 2 (two) times daily as needed for anxiety.  . diphenhydrAMINE (BENADRYL) 50 MG tablet Take 50 mg by mouth at bedtime as needed for sleep.   Marland Kitchen lisinopril (ZESTRIL) 20 MG tablet Take 1 tablet (20 mg total) by mouth daily.  . metoprolol tartrate (LOPRESSOR) 25 MG tablet Take 1 tablet (25 mg total) by mouth daily. QHS  . Microlet Lancets MISC   . Multiple Vitamins-Minerals (MULTIVITAMIN ADULT PO) Complete Multivitamin  . simvastatin (ZOCOR) 20 MG tablet Take 1 tablet (20 mg total) by mouth daily at 6 PM.  . [DISCONTINUED] blood glucose meter kit and supplies Dispense based on patient and insurance preference. Use up to bid  daily as directed. (FOR ICD-10 E10.9, E11.9).  . [DISCONTINUED] diazepam (VALIUM) 5 MG tablet Take 1 tablet (5 mg total) by mouth 2 (two) times daily as needed for anxiety.  . [DISCONTINUED] lisinopril (ZESTRIL) 20 MG  tablet Take 1 tablet (20 mg total) by mouth daily.  . [DISCONTINUED] simvastatin (ZOCOR) 20 MG tablet Take 1 tablet (20 mg total) by mouth daily at 6 PM.   Allergies  Allergen Reactions  . Adhesive [Tape] Other (See Comments)    Severe skin breakdown;paper tape   . Lactase Diarrhea  . Lipitor [Atorvastatin] Swelling    Swelling and leg pain  . Neomycin Other (See Comments)    Nausea    . Pollen Extract Other (See Comments)  . Levaquin [Levofloxacin In D5w] Nausea And Vomiting    Lightheadedness    Recent Results (from the past 2160 hour(s))  Hemoglobin A1c     Status: None   Collection Time: 06/18/20  9:20 AM  Result Value Ref Range   Hgb A1c MFr Bld 6.4 4.6 - 6.5 %    Comment: Glycemic Control Guidelines for People with Diabetes:Non Diabetic:  <6%Goal of Therapy: <7%Additional Action Suggested:  >8%   CBC with Differential/Platelet     Status: None   Collection Time: 06/18/20  9:20 AM  Result Value Ref Range   WBC 5.1 4.0 - 10.5 K/uL   RBC 4.54 3.87 - 5.11 Mil/uL   Hemoglobin 14.5 12.0 - 15.0 g/dL   HCT 42.7 36 - 46 %   MCV 94.0 78.0 - 100.0 fl   MCHC 34.0 30.0 - 36.0 g/dL   RDW 13.3 11.5 - 15.5 %   Platelets 252.0 150 - 400 K/uL   Neutrophils Relative % 57.6 43 - 77 %   Lymphocytes Relative 31.9 12 - 46 %   Monocytes Relative 7.9 3 - 12 %   Eosinophils Relative 1.5 0 - 5 %   Basophils Relative 1.1 0 - 3 %   Neutro Abs 2.9 1.4 - 7.7 K/uL   Lymphs Abs 1.6 0.7 - 4.0 K/uL   Monocytes Absolute 0.4 0 - 1 K/uL   Eosinophils Absolute 0.1 0 - 0 K/uL   Basophils Absolute 0.1 0 - 0 K/uL  Lipid panel     Status: None   Collection Time: 06/18/20  9:20 AM  Result Value Ref Range   Cholesterol 124 0 - 200 mg/dL    Comment: ATP III Classification       Desirable:  <  200 mg/dL               Borderline High:  200 - 239 mg/dL          High:  > = 240 mg/dL   Triglycerides 121.0 0 - 149 mg/dL    Comment: Normal:  <150 mg/dLBorderline High:  150 - 199 mg/dL   HDL 44.20 >39.00 mg/dL   VLDL 24.2 0.0 - 40.0 mg/dL   LDL Cholesterol 56 0 - 99 mg/dL   Total CHOL/HDL Ratio 3     Comment:                Men          Women1/2 Average Risk     3.4          3.3Average Risk          5.0          4.42X Average Risk          9.6          7.13X Average Risk          15.0          11.0  NonHDL 79.85     Comment: NOTE:  Non-HDL goal should be 30 mg/dL higher than patient's LDL goal (i.e. LDL goal of < 70 mg/dL, would have non-HDL goal of < 100 mg/dL)  Comprehensive metabolic panel     Status: Abnormal   Collection Time: 06/18/20  9:20 AM  Result Value Ref Range   Sodium 138 135 - 145 mEq/L   Potassium 4.3 3.5 - 5.1 mEq/L   Chloride 99 96 - 112 mEq/L   CO2 30 19 - 32 mEq/L   Glucose, Bld 107 (H) 70 - 99 mg/dL   BUN 15 6 - 23 mg/dL   Creatinine, Ser 0.90 0.40 - 1.20 mg/dL   Total Bilirubin 0.7 0.2 - 1.2 mg/dL   Alkaline Phosphatase 49 39 - 117 U/L   AST 36 0 - 37 U/L   ALT 43 (H) 0 - 35 U/L   Total Protein 6.7 6.0 - 8.3 g/dL   Albumin 4.7 3.5 - 5.2 g/dL   GFR 62.88 >60.00 mL/min   Calcium 9.9 8.4 - 10.5 mg/dL   Objective  Body mass index is 29.56 kg/m. Wt Readings from Last 3 Encounters:  06/23/20 137 lb (62.1 kg)  12/26/19 143 lb 12.8 oz (65.2 kg)  08/12/19 147 lb (66.7 kg)   Temp Readings from Last 3 Encounters:  06/23/20 98.9 F (37.2 C) (Oral)  12/26/19 (!) 97.2 F (36.2 C) (Temporal)  07/18/19 97.7 F (36.5 C) (Oral)   BP Readings from Last 3 Encounters:  06/23/20 124/82  12/26/19 114/70  08/12/19 (!) 152/92   Pulse Readings from Last 3 Encounters:  06/23/20 86  12/26/19 85  08/12/19 74    Physical Exam Vitals and nursing note reviewed.  Constitutional:      Appearance: Normal appearance. She is well-developed,  well-groomed and overweight.  HENT:     Head: Normocephalic and atraumatic.     Right Ear: There is impacted cerumen.     Left Ear: There is impacted cerumen.  Eyes:     Conjunctiva/sclera: Conjunctivae normal.     Pupils: Pupils are equal, round, and reactive to light.  Cardiovascular:     Rate and Rhythm: Normal rate and regular rhythm.     Heart sounds: Normal heart sounds. No murmur heard.   Pulmonary:     Effort: Pulmonary effort is normal.     Breath sounds: Normal breath sounds.  Abdominal:     Tenderness: There is no abdominal tenderness.  Skin:    General: Skin is warm and dry.  Neurological:     General: No focal deficit present.     Mental Status: She is alert and oriented to person, place, and time. Mental status is at baseline.     Gait: Gait normal.  Psychiatric:        Attention and Perception: Attention and perception normal.        Mood and Affect: Mood and affect normal.        Speech: Speech normal.        Behavior: Behavior normal. Behavior is cooperative.        Thought Content: Thought content normal.        Cognition and Memory: Cognition and memory normal.        Judgment: Judgment normal.     Assessment  Plan  Annual physical exam Needs flu shot - Plan: Flu Vaccine QUAD 6+ mos PF IM (Fluarix Quad PF) Tdaputd Consider shingrix in future ? If will get this twinrix3/3had hep B  immune  covid vaccine thinking about it declines for now as of 06/23/20   mammo 8/18/20neg ordered neg 06/03/20 neg  DEXA 06/11/18 osteopeniarepeat 06/11/21   Colonoscopy [facility] 08/12/15 IH and + collagenous colitis -will rec pt f/u with GI for these medications budesonide in future. Also h/o colon polyps  Pap negative 07/18/19 dermatology tbseschisenstein SCC scalpWebb avesaw 9/2029fu 05/2020 had f/u uti   Former smoker quit 2015 total 16 years max 1.5 ppdno FH lung cancer  -CT chest had 05/2018, 07/16/19 stable Ordered CT again for f/u needs before  08/2020 Quit smoking in 2015  Last eye exam 2019 [facility]2020 Dr. RValma Cavanarrow angle  rec healthy diet and exercise  Hyperlipidemia, unspecified hyperlipidemia type - Plan: simvastatin (ZOCOR) 20 MG tablet  Anxiety - Plan: diazepam (VALIUM) 5 MG tablet  Encounter for screening for lung cancer - Plan: CT CHEST LUNG CA SCREEN LOW DOSE W/O CM  Essential hypertension Cont meds and refilled   Bilateral impacted cerumen Pt consented and tolerated currette removal b/l ears today  All wax removed   Overweight (BMI 25.0-29.9)  Cont healthy wt and exercise goal is 110 wt for pt   Provider: Dr. TOlivia MackieMcLean-Scocuzza-Internal Medicine

## 2020-06-23 NOTE — Patient Instructions (Addendum)
Vital proteins collagen powder  Tea tree/rosemary Giovannis with tea tree  Biosil liquid drops  Nutrafol hair serum    Results for NAYZETH, ALTMAN" (MRN [id number]) as of 06/23/2020 12:14  Ref. Range 06/18/2020 09:20  Hemoglobin A1C Latest Ref Range: 4.6 - 6.5 % 6.4    Vitamin D3 2000 to 3000 IU max total no more total 4000 IU  Calcium 600 mg to 1200 mg max 1200 mg daily calcium  COVID-19 Vaccine Information can be found at: consider pfizer   [email or web address].uk For questions related to vaccine distribution or appointments, please email [email or web address] [email or web address] or call [phone].    Consider 1 year f/u Dr. Rockey Situ 07/2020 due

## 2020-06-23 NOTE — Addendum Note (Signed)
Addended by: Orland Mustard on: 06/23/2020 01:16 PM   Modules accepted: Orders

## 2020-07-19 ENCOUNTER — Ambulatory Visit: Payer: BC Managed Care – PPO

## 2020-07-20 ENCOUNTER — Encounter: Payer: Self-pay | Admitting: Internal Medicine

## 2020-07-20 ENCOUNTER — Ambulatory Visit
Admission: RE | Admit: 2020-07-20 | Discharge: 2020-07-20 | Disposition: A | Discharge status: Home / Self Care | Payer: BC Managed Care – PPO | Source: Ambulatory Visit | Attending: Internal Medicine | Admitting: Internal Medicine

## 2020-07-20 ENCOUNTER — Other Ambulatory Visit: Payer: Self-pay

## 2020-07-20 DIAGNOSIS — Z122 Encounter for screening for malignant neoplasm of respiratory organs: Secondary | ICD-10-CM | POA: Insufficient documentation

## 2020-07-22 ENCOUNTER — Encounter: Payer: Self-pay | Admitting: *Deleted

## 2020-08-02 NOTE — Progress Notes (Signed)
Cardiology Office Note  Date:  08/03/2020   ID:  LAYANNA CHARO, DOB [birth year], MRN [id number]  PCP:  McLean-Scocuzza, Nino Glow, MD   Chief Complaint  Patient presents with  . Other    12 month follow up. Patient c.o - some fluttering in chest.  Meds reviewed verbally with patient.     HPI:  Vanessa Herman is a 64 year old woman with past medical history of  Coronary calcification on CT scan Diabetes type 2 Hypertension Obesity Aortic atherosclerosis Mild in the arch,  Stopped smoking 5 yrs ago Presents for f/u of her palpitations and family history of coronary disease  In follow-up today reports that she feels well Has some rare palpitations Takes metoprolol tartrate in the pm, this was started by primary care  No exercise, sedentary   Labs reviewed with her on today's visit  total chol 124, LDL 56 HBA1C 6.4  Echo 08/2019 reviewed EF normal  CT scan chest reviewed Coronary calcifications  EKG personally reviewed by myself on todays visit nsr with rate 73 bpm  Other past medical history reviewed FH dad and both grandparents heart dz,   Prior CT scan August 2019 Showing coronary calcification, aortic atherosclerosis  Prior carotid ultrasound August 2019 Minor carotid atherosclerosis less than 50% bilaterally   PMH:   has a past medical history of Allergy, Anxiety, Chicken pox, Collagenous colitis, Colon polyps, DDD (degenerative disc disease), cervical, Depression, Fatty liver, Ganglion cyst of wrist, left, Hemorrhoids, Hemorrhoids, History of colon polyps, History of prediabetes, Hyperlipidemia (07/06/2015), Hypertension, IBS (irritable bowel syndrome) (07/06/2015), NS (neck stiffness), Polycythemia, secondary (07/06/2015), Prediabetes, SCC (squamous cell carcinoma), Urinary incontinence, and Vitamin D deficiency (07/06/2015).  PSH:    Past Surgical History:  Procedure Laterality Date  . COLONOSCOPY  2006  . COLONOSCOPY WITH PROPOFOL N/A 08/12/2015   Procedure:  COLONOSCOPY WITH PROPOFOL;  Surgeon: [name], MD;  Location: [facility];  Service: Endoscopy;  Laterality: N/A;  PT WOULD LIKE EARLY AM PER JS  . EYE SURGERY     right eye 05/17/20 and 06/07/20 Dr. Valma Cava my eye MD f/u 06/2020  . HYSTEROSALPINGOGRAM     1983  . TONSILLECTOMY AND ADENOIDECTOMY     age [age]    Current Outpatient Medications  Medication Sig Dispense Refill  . aspirin 81 MG tablet Take 81 mg by mouth every other day.     . budesonide (ENTOCORT EC) 3 MG 24 hr capsule Take one tablet (3MG ) by mouth every two days    . calcium carbonate (CALCIUM 600) 600 MG TABS tablet Calcium 600 + D(3) 600 mg (1,500 mg)-400 unit tablet    . Cholecalciferol (VITAMIN D) 2000 units CAPS Vitamin D3 2,000 unit capsule   1 capsule every day by oral route.    . CONTOUR NEXT TEST test strip Qd to bid contour brand 200 each 3  . diazepam (VALIUM) 5 MG tablet Take 5 mg by mouth daily.    . diphenhydrAMINE (BENADRYL) 50 MG tablet Take 50 mg by mouth at bedtime as needed for sleep.     Marland Kitchen lisinopril (ZESTRIL) 20 MG tablet Take 1 tablet (20 mg total) by mouth daily. 90 tablet 3  . metoprolol tartrate (LOPRESSOR) 25 MG tablet Take 1 tablet (25 mg total) by mouth daily. QHS 90 tablet 3  . Microlet Lancets MISC Qd to bid check blood sugar e11.59 contour brand 200 each 3  . Multiple Vitamin (MULTIVITAMIN) tablet Take 1 tablet by mouth every other day.     Marland Kitchen  Multiple Vitamins-Minerals (MULTIVITAMIN ADULT PO) Complete Multivitamin    . simvastatin (ZOCOR) 20 MG tablet Take 1 tablet (20 mg total) by mouth daily at 6 PM. 90 tablet 3   No current facility-administered medications for this visit.     Allergies:   Adhesive [tape], Lactase, Lipitor [atorvastatin], Neomycin, Pollen extract, and Levaquin [levofloxacin in d5w]   Social History:  The patient  reports that she quit smoking about 6 years ago. She has a 52.50 pack-year smoking history. She has never used smokeless tobacco. She reports current  alcohol use. She reports that she does not use drugs.   Family History:   family history includes Breast cancer (age of onset: 80) in her maternal grandmother; COPD in her father; Cancer in her maternal grandmother; Diabetes in her mother; Hearing loss in her father; Heart attack in her father, maternal grandfather, and paternal grandfather; Heart disease in her father, maternal grandfather, paternal grandfather, and paternal grandmother; Hyperlipidemia in her father and mother; Hypertension in her father; Osteoporosis in an other family member; Stroke in her mother; Thyroid disease in her daughter.    Review of Systems: Review of Systems  Constitutional: Negative.   HENT: Negative.   Respiratory: Negative.   Cardiovascular: Negative.   Gastrointestinal: Negative.   Musculoskeletal: Negative.   Neurological: Negative.   Psychiatric/Behavioral: Negative.   All other systems reviewed and are negative.   PHYSICAL EXAM: VS:  BP 116/82 (BP Location: Left Arm, Patient Position: Sitting, Cuff Size: Normal)   Pulse 73   Ht 4\' 10"  (1.473 m)   Wt 137 lb (62.1 kg)   SpO2 98%   BMI 28.63 kg/m  , BMI Body mass index is 28.63 kg/m. Constitutional:  oriented to person, place, and time. No distress.  HENT:  Head: Grossly normal Eyes:  no discharge. No scleral icterus.  Neck: No JVD, no carotid bruits  Cardiovascular: Regular rate and rhythm, no murmurs appreciated Pulmonary/Chest: Clear to auscultation bilaterally, no wheezes or rails Abdominal: Soft.  no distension.  no tenderness.  Musculoskeletal: Normal range of motion Neurological:  normal muscle tone. Coordination normal. No atrophy Skin: Skin warm and dry Psychiatric: normal affect, pleasant  Recent Labs: 06/18/2020: ALT 43; BUN 15; Creatinine, Ser 0.90; Hemoglobin 14.5; Platelets 252.0; Potassium 4.3; Sodium 138    Lipid Panel Lab Results  Component Value Date   CHOL 124 06/18/2020   HDL 44.20 06/18/2020   LDLCALC 56 06/18/2020    TRIG 121.0 06/18/2020      Wt Readings from Last 3 Encounters:  08/03/20 137 lb (62.1 kg)  07/20/20 135 lb (61.2 kg)  06/23/20 137 lb (62.1 kg)     ASSESSMENT AND PLAN:  Aortic atherosclerosis (HCC) Mild in the arch, images reviewed with her today Stopped smoking 5 yrs ago Lipids at goal,  A1c relatively low  Coronary artery calcification seen on CAT scan mild disease, LAD and LCX Aggressive diabetes control recommended , Walking program recommended Cholesterol at goal Denies any anginal symptoms  Essential hypertension Blood pressure is well controlled on today's visit. No changes made to the medications.  Controlled type 2 diabetes mellitus without complication, without long-term current use of insulin ([place]) We have encouraged continued exercise, careful diet management in an effort to lose weight.  Stable  Palpitations Well controlled  No further work-up at this time  Nonspecific chest pain Denies significant chest pain    Total encounter time more than 25 minutes  Greater than 50% was spent in counseling and coordination of care  with the patient   No orders of the defined types were placed in this encounter.    Signed, Esmond Plants, M.D., Ph.D. 08/03/2020  [place], [place]

## 2020-08-03 ENCOUNTER — Encounter: Payer: Self-pay | Admitting: Cardiovascular Disease

## 2020-08-03 ENCOUNTER — Other Ambulatory Visit: Payer: Self-pay

## 2020-08-03 ENCOUNTER — Ambulatory Visit: Payer: BC Managed Care – PPO | Admitting: Cardiovascular Disease

## 2020-08-03 VITALS — BP 116/82 | HR 73 | Ht <= 58 in | Wt 137.0 lb

## 2020-08-03 DIAGNOSIS — I10 Essential (primary) hypertension: Secondary | ICD-10-CM

## 2020-08-03 DIAGNOSIS — I700 Atherosclerosis of aorta: Secondary | ICD-10-CM | POA: Diagnosis not present

## 2020-08-03 DIAGNOSIS — E782 Mixed hyperlipidemia: Secondary | ICD-10-CM

## 2020-08-03 DIAGNOSIS — E119 Type 2 diabetes mellitus without complications: Secondary | ICD-10-CM

## 2020-08-03 DIAGNOSIS — I2510 Atherosclerotic heart disease of native coronary artery without angina pectoris: Secondary | ICD-10-CM | POA: Diagnosis not present

## 2020-08-03 DIAGNOSIS — R079 Chest pain, unspecified: Secondary | ICD-10-CM

## 2020-08-03 DIAGNOSIS — R002 Palpitations: Secondary | ICD-10-CM

## 2020-08-03 NOTE — Patient Instructions (Signed)
Medication Instructions:  No changes  If you need a refill on your cardiac medications before your next appointment, please call your pharmacy.    Lab work: No new labs needed   If you have labs (blood work) drawn today and your tests are completely normal, you will receive your results only by: . MyChart Message (if you have MyChart) OR . A paper copy in the mail If you have any lab test that is abnormal or we need to change your treatment, we will call you to review the results.   Testing/Procedures: No new testing needed   Follow-Up: At [facility], you and your health needs are our priority.  As part of our continuing mission to provide you with exceptional heart care, we have created designated Provider Care Teams.  These Care Teams include your primary Cardiologist (physician) and Advanced Practice Providers (APPs -  Physician Assistants and Nurse Practitioners) who all work together to provide you with the care you need, when you need it.  . You will need a follow up appointment in 12 months  . Providers on your designated Care Team:   . Christopher Berge, NP . Ryan Dunn, PA-C . Jacquelyn Visser, PA-C  Any Other Special Instructions Will Be Listed Below (If Applicable).  COVID-19 Vaccine Information can be found at: [email or web address] For questions related to vaccine distribution or appointments, please email [email or web address] or call [phone].     

## 2020-08-27 ENCOUNTER — Telehealth: Payer: Self-pay | Admitting: Internal Medicine

## 2020-08-27 MED ORDER — BLOOD GLUCOSE METER KIT: PACK | 0 refills | Status: DC

## 2020-08-27 NOTE — Telephone Encounter (Signed)
Pt needs a new rx sent to [facility] for a new blood sugar kit

## 2020-09-03 ENCOUNTER — Encounter: Payer: Self-pay | Admitting: Internal Medicine

## 2020-09-06 MED ORDER — BLOOD GLUCOSE METER KIT: PACK | 0 refills | Status: DC

## 2020-09-08 NOTE — Telephone Encounter (Signed)
Faxed Rx for 1 year lancets & strips faxed on 09-07-2020

## 2020-12-17 ENCOUNTER — Other Ambulatory Visit: Payer: Self-pay

## 2020-12-17 ENCOUNTER — Other Ambulatory Visit (INDEPENDENT_AMBULATORY_CARE_PROVIDER_SITE_OTHER): Payer: Medicare Other

## 2020-12-17 DIAGNOSIS — R7303 Prediabetes: Secondary | ICD-10-CM | POA: Diagnosis not present

## 2020-12-17 DIAGNOSIS — Z1329 Encounter for screening for other suspected endocrine disorder: Secondary | ICD-10-CM | POA: Diagnosis not present

## 2020-12-17 DIAGNOSIS — Z1389 Encounter for screening for other disorder: Secondary | ICD-10-CM

## 2020-12-17 DIAGNOSIS — I10 Essential (primary) hypertension: Secondary | ICD-10-CM

## 2020-12-17 LAB — LIPID PANEL
Cholesterol: 139 mg/dL (ref 0–200)
HDL: 54.1 mg/dL (ref >39.00)
LDL Cholesterol: 56 mg/dL (ref 0–99)
NonHDL: 84.74
Total CHOL/HDL Ratio: 3
Triglycerides: 145 mg/dL (ref 0.0–149.0)
VLDL: 29 mg/dL (ref 0.0–40.0)

## 2020-12-17 LAB — COMPREHENSIVE METABOLIC PANEL
ALT: 37 U/L — ABNORMAL HIGH (ref 0–35)
AST: 31 U/L (ref 0–37)
Albumin: 4.5 g/dL (ref 3.5–5.2)
Alkaline Phosphatase: 52 U/L (ref 39–117)
BUN: 14 mg/dL (ref 6–23)
CO2: 27 mEq/L (ref 19–32)
Calcium: 9.8 mg/dL (ref 8.4–10.5)
Chloride: 101 mEq/L (ref 96–112)
Creatinine, Ser: 0.77 mg/dL (ref 0.40–1.20)
GFR: 81.01 mL/min (ref >60.00)
Glucose, Bld: 123 mg/dL — ABNORMAL HIGH (ref 70–99)
Potassium: 4.5 mEq/L (ref 3.5–5.1)
Sodium: 138 mEq/L (ref 135–145)
Total Bilirubin: 0.7 mg/dL (ref 0.2–1.2)
Total Protein: 6.9 g/dL (ref 6.0–8.3)

## 2020-12-17 LAB — CBC WITH DIFFERENTIAL/PLATELET
Basophils Absolute: 0 10*3/uL (ref 0.0–0.1)
Basophils Relative: 0.7 % (ref 0.0–3.0)
Eosinophils Absolute: 0.1 10*3/uL (ref 0.0–0.7)
Eosinophils Relative: 2.1 % (ref 0.0–5.0)
HCT: 41.1 % (ref 36.0–46.0)
Hemoglobin: 13.9 g/dL (ref 12.0–15.0)
Lymphocytes Relative: 27.9 % (ref 12.0–46.0)
Lymphs Abs: 1.9 10*3/uL (ref 0.7–4.0)
MCHC: 33.8 g/dL (ref 30.0–36.0)
MCV: 92.2 fl (ref 78.0–100.0)
Monocytes Absolute: 0.5 10*3/uL (ref 0.1–1.0)
Monocytes Relative: 7.1 % (ref 3.0–12.0)
Neutro Abs: 4.3 10*3/uL (ref 1.4–7.7)
Neutrophils Relative %: 62.2 % (ref 43.0–77.0)
Platelets: 275 10*3/uL (ref 150.0–400.0)
RBC: 4.45 Mil/uL (ref 3.87–5.11)
RDW: 14 % (ref 11.5–15.5)
WBC: 7 10*3/uL (ref 4.0–10.5)

## 2020-12-17 LAB — HEMOGLOBIN A1C: Hgb A1c MFr Bld: 6.5 % (ref 4.6–6.5)

## 2020-12-17 LAB — TSH: TSH: 2.03 u[IU]/mL (ref 0.35–4.50)

## 2020-12-18 LAB — URINALYSIS, ROUTINE W REFLEX MICROSCOPIC
Bilirubin Urine: NEGATIVE
Glucose, UA: NEGATIVE
Hgb urine dipstick: NEGATIVE
Ketones, ur: NEGATIVE
Leukocytes,Ua: NEGATIVE
Nitrite: NEGATIVE
Protein, ur: NEGATIVE
Specific Gravity, Urine: 1.008 (ref 1.001–1.03)
pH: 7 (ref 5.0–8.0)

## 2020-12-21 ENCOUNTER — Ambulatory Visit (INDEPENDENT_AMBULATORY_CARE_PROVIDER_SITE_OTHER): Payer: Medicare Other | Admitting: Internal Medicine

## 2020-12-21 ENCOUNTER — Encounter: Payer: Self-pay | Admitting: Internal Medicine

## 2020-12-21 ENCOUNTER — Other Ambulatory Visit: Payer: Self-pay

## 2020-12-21 VITALS — BP 130/86 | HR 72 | Temp 98.6°F | Ht <= 58 in | Wt 141.1 lb

## 2020-12-21 DIAGNOSIS — E1159 Type 2 diabetes mellitus with other circulatory complications: Secondary | ICD-10-CM

## 2020-12-21 DIAGNOSIS — Z1231 Encounter for screening mammogram for malignant neoplasm of breast: Secondary | ICD-10-CM

## 2020-12-21 DIAGNOSIS — I152 Hypertension secondary to endocrine disorders: Secondary | ICD-10-CM

## 2020-12-21 DIAGNOSIS — I700 Atherosclerosis of aorta: Secondary | ICD-10-CM

## 2020-12-21 DIAGNOSIS — I2510 Atherosclerotic heart disease of native coronary artery without angina pectoris: Secondary | ICD-10-CM

## 2020-12-21 DIAGNOSIS — I10 Essential (primary) hypertension: Secondary | ICD-10-CM

## 2020-12-21 DIAGNOSIS — E538 Deficiency of other specified B group vitamins: Secondary | ICD-10-CM

## 2020-12-21 DIAGNOSIS — E2839 Other primary ovarian failure: Secondary | ICD-10-CM

## 2020-12-21 DIAGNOSIS — E785 Hyperlipidemia, unspecified: Secondary | ICD-10-CM

## 2020-12-21 DIAGNOSIS — M8580 Other specified disorders of bone density and structure, unspecified site: Secondary | ICD-10-CM

## 2020-12-21 DIAGNOSIS — K760 Fatty (change of) liver, not elsewhere classified: Secondary | ICD-10-CM

## 2020-12-21 MED ORDER — LISINOPRIL 20 MG PO TABS: 20.0000 mg | ORAL_TABLET | Freq: Every day | ORAL | 3 refills | Status: DC

## 2020-12-21 MED ORDER — METOPROLOL TARTRATE 25 MG PO TABS: 25.0000 mg | ORAL_TABLET | Freq: Every day | ORAL | 3 refills | Status: DC

## 2020-12-21 MED ORDER — SIMVASTATIN 20 MG PO TABS: 20.0000 mg | ORAL_TABLET | Freq: Every day | ORAL | 3 refills | Status: DC

## 2020-12-21 NOTE — Progress Notes (Signed)
Chief Complaint  Patient presents with  . Follow-up    6 mo f/u. Pt states she's feeling better. Started taking magnesium and has felt more energetic with a better mood.    6 month f/u  1. htn elevated today on lis 20 mg and lopressor 25 mg qhs she has not been checking BP at home A1C 6.5 rec healthy diet and exercise  2. Had covid 11/02/20 with PND resolved unvaccinated and declines vaccine today  3. Feeling better since starting magnesium 250 mg qd, selenium 50 mcg qd and vitamin C 500 mg qd better mood, more energy, memory better   4. Reviewed labs h/o aortic atherosclerosis/CAD lipid panel controlled on zocor 20 mg qd will f/u cards Dr. Rockey Situ 07/2021    Review of Systems  Constitutional: Negative for weight loss.  HENT: Positive for tinnitus. Negative for hearing loss.   Eyes: Negative for blurred vision.  Respiratory: Negative for shortness of breath.   Cardiovascular: Negative for chest pain.  Gastrointestinal: Negative for abdominal pain.  Musculoskeletal: Negative for falls and joint pain.  Skin: Negative for rash.  Neurological: Negative for headaches.  Psychiatric/Behavioral: Negative for depression and memory loss. The patient does not have insomnia.    Past Medical History:  Diagnosis Date  . Allergy   . Anxiety   . Chicken pox   . Collagenous colitis    Dr. Allen Norris   . Colon polyps   . COVID-19    11/02/20 from husband pnd + with sxs  . DDD (degenerative disc disease), cervical    had PT in the past, noted imaging 05/31/10   . Depression   . Fatty liver   . Ganglion cyst of wrist, left   . Hemorrhoids   . Hemorrhoids   . History of colon polyps   . History of prediabetes   . Hyperlipidemia 07/06/2015  . Hypertension   . IBS (irritable bowel syndrome) 07/06/2015  . NS (neck stiffness)    xray shows something at "C3" approx 4 yrs ago. Had PT. Helped.  . Polycythemia, secondary 07/06/2015  . Prediabetes   . SCC (squamous cell carcinoma)    scalp s/p mohs 11/07/2018  [facility] Dr. Manley Mason  . Urinary incontinence   . Vitamin D deficiency 07/06/2015   Past Surgical History:  Procedure Laterality Date  . COLONOSCOPY  2006  . COLONOSCOPY WITH PROPOFOL N/A 08/12/2015   Procedure: COLONOSCOPY WITH PROPOFOL;  Surgeon: [name], MD;  Location: [facility];  Service: Endoscopy;  Laterality: N/A;  PT WOULD LIKE EARLY AM PER JS  . EYE SURGERY     right eye 05/17/20 and 06/07/20 Dr. Valma Cava my eye MD f/u 06/2020 cataract surgery narrow angles  . HYSTEROSALPINGOGRAM     1983  . TONSILLECTOMY AND ADENOIDECTOMY     age 11-7   Family History  Problem Relation Age of Onset  . Breast cancer Maternal Grandmother 60  . Cancer Maternal Grandmother        breast  . Hypertension Father   . Hyperlipidemia Father   . COPD Father   . Hearing loss Father   . Heart disease Father   . Heart attack Father   . Diabetes Mother   . Hyperlipidemia Mother   . Stroke Mother   . Thyroid disease Daughter   . Heart disease Maternal Grandfather   . Heart attack Maternal Grandfather   . Heart disease Paternal Grandfather   . Heart attack Paternal Grandfather   . Osteoporosis Other   .  Heart disease Paternal Grandmother    Social History   Socioeconomic History  . Marital status: Married    Spouse name: Not on file  . Number of children: Not on file  . Years of education: Not on file  . Highest education level: Not on file  Occupational History  . Not on file  Tobacco Use  . Smoking status: Former Smoker    Packs/day: 1.25    Years: 42.00    Pack years: 52.50    Quit date: 04/03/2014    Years since quitting: 6.7  . Smokeless tobacco: Never Used  Substance and Sexual Activity  . Alcohol use: Yes    Alcohol/week: 0.0 standard drinks    Comment: wine daily  . Drug use: No  . Sexual activity: Not Currently  Other Topics Concern  . Not on file  Social History Narrative   Married    Some college, retired    2 daughters    No guns    Wears seat belt    Safe in  relationship    Social Determinants of Radio broadcast assistant Strain: Not on file  Food Insecurity: Not on file  Transportation Needs: Not on file  Physical Activity: Not on file  Stress: Not on file  Social Connections: Not on file  Intimate Partner Violence: Not on file   Current Meds  Medication Sig  . aspirin 81 MG tablet Take 81 mg by mouth every other day.   . blood glucose meter kit and supplies Dispense based on patient and insurance preference. Use up to four times daily as directed. (FOR ICD-10 E10.9, E11.9).  . budesonide (ENTOCORT EC) 3 MG 24 hr capsule Take one tablet (3MG) by mouth every two days  . calcium carbonate (OS-CAL) 600 MG TABS tablet Calcium 600 + D(3) 600 mg (1,500 mg)-400 unit tablet  . Cholecalciferol (VITAMIN D) 2000 units CAPS Vitamin D3 2,000 unit capsule   1 capsule every day by oral route.  . CONTOUR NEXT TEST test strip Qd to bid contour brand  . diazepam (VALIUM) 5 MG tablet Take 5 mg by mouth daily.  . diphenhydrAMINE (BENADRYL) 50 MG tablet Take 50 mg by mouth at bedtime as needed for sleep.   . magnesium 30 MG tablet Take 250 mg by mouth daily.  . Microlet Lancets MISC Qd to bid check blood sugar e11.59 contour brand  . Multiple Vitamin (MULTIVITAMIN) tablet Take 1 tablet by mouth every other day.   . Multiple Vitamins-Minerals (MULTIVITAMIN ADULT PO) Complete Multivitamin  . selenium 50 MCG TABS tablet Take 50 mcg by mouth daily.  . vitamin C (ASCORBIC ACID) 500 MG tablet Take 500 mg by mouth daily.  . [DISCONTINUED] lisinopril (ZESTRIL) 20 MG tablet Take 1 tablet (20 mg total) by mouth daily.  . [DISCONTINUED] metoprolol tartrate (LOPRESSOR) 25 MG tablet Take 1 tablet (25 mg total) by mouth daily. QHS  . [DISCONTINUED] simvastatin (ZOCOR) 20 MG tablet Take 1 tablet (20 mg total) by mouth daily at 6 PM.   Allergies  Allergen Reactions  . Adhesive [Tape] Other (See Comments)    Severe skin breakdown;paper tape   . Lactase Diarrhea  .  Lipitor [Atorvastatin] Swelling    Swelling and leg pain  . Neomycin Other (See Comments)    Nausea    . Pollen Extract Other (See Comments)  . Levaquin [Levofloxacin In D5w] Nausea And Vomiting    Lightheadedness    Recent Results (from the past 2160 hour(s))  Hemoglobin A1c     Status: None   Collection Time: 12/17/20  9:09 AM  Result Value Ref Range   Hgb A1c MFr Bld 6.5 4.6 - 6.5 %    Comment: Glycemic Control Guidelines for People with Diabetes:Non Diabetic:  <6%Goal of Therapy: <7%Additional Action Suggested:  >8%   Urinalysis, Routine w reflex microscopic     Status: None   Collection Time: 12/17/20  9:09 AM  Result Value Ref Range   Color, Urine YELLOW YELLOW   APPearance CLEAR CLEAR   Specific Gravity, Urine 1.008 1.001 - 1.03   pH 7.0 5.0 - 8.0   Glucose, UA NEGATIVE NEGATIVE   Bilirubin Urine NEGATIVE NEGATIVE   Ketones, ur NEGATIVE NEGATIVE   Hgb urine dipstick NEGATIVE NEGATIVE   Protein, ur NEGATIVE NEGATIVE   Nitrite NEGATIVE NEGATIVE   Leukocytes,Ua NEGATIVE NEGATIVE  TSH     Status: None   Collection Time: 12/17/20  9:09 AM  Result Value Ref Range   TSH 2.03 0.35 - 4.50 uIU/mL  CBC with Differential/Platelet     Status: None   Collection Time: 12/17/20  9:09 AM  Result Value Ref Range   WBC 7.0 4.0 - 10.5 K/uL   RBC 4.45 3.87 - 5.11 Mil/uL   Hemoglobin 13.9 12.0 - 15.0 g/dL   HCT 41.1 36.0 - 46.0 %   MCV 92.2 78.0 - 100.0 fl   MCHC 33.8 30.0 - 36.0 g/dL   RDW 14.0 11.5 - 15.5 %   Platelets 275.0 150.0 - 400.0 K/uL   Neutrophils Relative % 62.2 43.0 - 77.0 %   Lymphocytes Relative 27.9 12.0 - 46.0 %   Monocytes Relative 7.1 3.0 - 12.0 %   Eosinophils Relative 2.1 0.0 - 5.0 %   Basophils Relative 0.7 0.0 - 3.0 %   Neutro Abs 4.3 1.4 - 7.7 K/uL   Lymphs Abs 1.9 0.7 - 4.0 K/uL   Monocytes Absolute 0.5 0.1 - 1.0 K/uL   Eosinophils Absolute 0.1 0.0 - 0.7 K/uL   Basophils Absolute 0.0 0.0 - 0.1 K/uL  Comprehensive metabolic panel     Status: Abnormal    Collection Time: 12/17/20  9:09 AM  Result Value Ref Range   Sodium 138 135 - 145 mEq/L   Potassium 4.5 3.5 - 5.1 mEq/L   Chloride 101 96 - 112 mEq/L   CO2 27 19 - 32 mEq/L   Glucose, Bld 123 (H) 70 - 99 mg/dL   BUN 14 6 - 23 mg/dL   Creatinine, Ser 0.77 0.40 - 1.20 mg/dL   Total Bilirubin 0.7 0.2 - 1.2 mg/dL   Alkaline Phosphatase 52 39 - 117 U/L   AST 31 0 - 37 U/L   ALT 37 (H) 0 - 35 U/L   Total Protein 6.9 6.0 - 8.3 g/dL   Albumin 4.5 3.5 - 5.2 g/dL   GFR 81.01 >60.00 mL/min    Comment: Calculated using the CKD-EPI Creatinine Equation (2021)   Calcium 9.8 8.4 - 10.5 mg/dL  Lipid panel     Status: None   Collection Time: 12/17/20  9:09 AM  Result Value Ref Range   Cholesterol 139 0 - 200 mg/dL    Comment: ATP III Classification       Desirable:  < 200 mg/dL               Borderline High:  200 - 239 mg/dL          High:  > = 240 mg/dL  Triglycerides 145.0 0.0 - 149.0 mg/dL    Comment: Normal:  <150 mg/dLBorderline High:  150 - 199 mg/dL   HDL 54.10 >39.00 mg/dL   VLDL 29.0 0.0 - 40.0 mg/dL   LDL Cholesterol 56 0 - 99 mg/dL   Total CHOL/HDL Ratio 3     Comment:                Men          Women1/2 Average Risk     3.4          3.3Average Risk          5.0          4.42X Average Risk          9.6          7.13X Average Risk          15.0          11.0                       NonHDL 84.74     Comment: NOTE:  Non-HDL goal should be 30 mg/dL higher than patient's LDL goal (i.e. LDL goal of < 70 mg/dL, would have non-HDL goal of < 100 mg/dL)   Objective  Body mass index is 30.54 kg/m. Wt Readings from Last 3 Encounters:  12/21/20 141 lb 2 oz (64 kg)  08/03/20 137 lb (62.1 kg)  07/20/20 135 lb (61.2 kg)   Temp Readings from Last 3 Encounters:  12/21/20 98.6 F (37 C)  06/23/20 98.9 F (37.2 C) (Oral)  12/26/19 (!) 97.2 F (36.2 C) (Temporal)   BP Readings from Last 3 Encounters:  12/21/20 130/86  08/03/20 116/82  06/23/20 124/82   Pulse Readings from Last 3  Encounters:  12/21/20 72  08/03/20 73  06/23/20 86    Physical Exam Vitals and nursing note reviewed.  Constitutional:      Appearance: Normal appearance. She is well-developed and well-groomed. She is obese.  HENT:     Head: Normocephalic and atraumatic.  Eyes:     Conjunctiva/sclera: Conjunctivae normal.     Pupils: Pupils are equal, round, and reactive to light.  Cardiovascular:     Rate and Rhythm: Normal rate and regular rhythm.     Heart sounds: Normal heart sounds. No murmur heard.   Pulmonary:     Effort: Pulmonary effort is normal.     Breath sounds: Normal breath sounds.  Abdominal:     Tenderness: There is no abdominal tenderness.  Skin:    General: Skin is warm and moist.  Neurological:     General: No focal deficit present.     Mental Status: She is alert and oriented to person, place, and time. Mental status is at baseline.     Gait: Gait normal.  Psychiatric:        Attention and Perception: Attention and perception normal.        Mood and Affect: Mood and affect normal.        Speech: Speech normal.        Behavior: Behavior normal. Behavior is cooperative.        Thought Content: Thought content normal.        Cognition and Memory: Cognition and memory normal.        Judgment: Judgment normal.     Assessment  Plan  Essential hypertension - Plan: metoprolol tartrate (LOPRESSOR) 25 MG tablet qhs consider xl if BP  still elevated and consider increase lis 20 mg to 30 mg qd  Hyperlipidemia, unspecified hyperlipidemia type - Plan: simvastatin (ZOCOR) 20 MG tablet  Coronary artery disease involving native coronary artery of native heart without angina pectoris Aortic atherosclerosis (HCC) On aspirin 8 59m qd  See other meds above  F/u Dr. GRockey Situ10/2022   Hypertension associated with diabetes (HSchlater  A1C 6.5  rec healthy diet and exercise   HM utd flu Tdaputd Consider shingrix in future? If will get this given info about shingrix   twinrix3/3hadhep B immune  -consider hep A testing in future covid vaccine declines had covid 11/02/20 prevnar declines today given info today 12/21/20  mammo 8/18/20negorderedneg 06/03/20 neg  DEXA 06/11/18 osteopeniarepeat 06/11/21   Colonoscopy [facility] 08/12/15 IH and + collagenous colitis -will rec pt f/u with GI for these medicationsbudesonide in future. Also h/o colon polyps  Pap negative 07/18/19  dermatology tbseschisenstein SCC scalpWebb avesaw [id number] 05/2020 f/u 06/16/21  Former smoker quit 2015 total 16 years max 1.5 ppdno FH lung cancer  -CT chest had 05/2018, 07/16/19 stable 07/2020  IMPRESSION: 1. Lung-RADS 2S, benign appearance or behavior. Continue annual screening with low-dose chest CT without contrast in 12 months. 2. The "S" modifier above refers to potentially clinically significant non lung cancer related findings. Specifically, there is aortic atherosclerosis, in addition to left main and 3 vessel coronary artery disease. Please note that although the presence of coronary artery calcium documents the presence of coronary artery disease, the severity of this disease and any potential stenosis cannot be assessed on this non-gated CT examination. Assessment for potential risk factor modification, dietary therapy or pharmacologic therapy may be warranted, if clinically indicated. 3. Mild diffuse bronchial wall thickening with very mild centrilobular and paraseptal emphysema; imaging findings suggestive of underlying COPD. 4. There are calcifications of the mitral annulus. Echocardiographic correlation for evaluation of potential valvular dysfunction may be warranted if clinically indicated.  Aortic Atherosclerosis (ICD10-I70.0) and Emphysema (ICD10-J43.9). --> Quit smoking in 2015  Cards Dr. GoRockey Situue 07/2021   [facility] Dr. RoValma Cavaarrow angle had cataract surgery 05/2020-call to schedule f/u  rec  healthy diet and exercise  Hyperlipidemia, unspecified hyperlipidemia type - Plan: simvastatin (ZOCOR) 20 MG tablet Controlled    Provider: Dr. TrOlivia MackiecLean-Scocuzza-Internal Medicine

## 2020-12-21 NOTE — Patient Instructions (Addendum)
Goal bp <130/<80 if not please call me will increase lisinopril to 30 mg daily  Cardiology Dr. Rockey Situ appt due 08/03/21  Consider shingrix vaccine for shingles   Show your eye doctor  Ref. Range 12/17/2020 09:09  Hemoglobin A1C Latest Ref Range: 4.6 - 6.5 % 6.5    Pneumococcal Conjugate Vaccine (Prevnar 13) Suspension for Injection What is this medicine? PNEUMOCOCCAL VACCINE (NEU mo KOK al vak SEEN) is a vaccine used to prevent pneumococcus bacterial infections. These bacteria can cause serious infections like pneumonia, meningitis, and blood infections. This vaccine will lower your chance of getting pneumonia. If you do get pneumonia, it can make your symptoms milder and your illness shorter. This vaccine will not treat an infection and will not cause infection. This vaccine is recommended for infants and young children, adults with certain medical conditions, and adults 30 years or older. This medicine may be used for other purposes; ask your health care provider or pharmacist if you have questions. COMMON BRAND NAME(S): Prevnar, Prevnar 13 What should I tell my health care provider before I take this medicine? They need to know if you have any of these conditions:  bleeding problems  fever  immune system problems  an unusual or allergic reaction to pneumococcal vaccine, diphtheria toxoid, other vaccines, latex, other medicines, foods, dyes, or preservatives  pregnant or trying to get pregnant  breast-feeding How should I use this medicine? This vaccine is for injection into a muscle. It is given by a health care professional. A copy of Vaccine Information Statements will be given before each vaccination. Read this sheet carefully each time. The sheet may change frequently. Talk to your pediatrician regarding the use of this medicine in children. While this drug may be prescribed for children as young as 35 weeks old for selected conditions, precautions do apply. Overdosage: If you  think you have taken too much of this medicine contact a poison control center or emergency room at once. NOTE: This medicine is only for you. Do not share this medicine with others. What if I miss a dose? It is important not to miss your dose. Call your doctor or health care professional if you are unable to keep an appointment. What may interact with this medicine?  medicines for cancer chemotherapy  medicines that suppress your immune function  steroid medicines like prednisone or cortisone This list may not describe all possible interactions. Give your health care provider a list of all the medicines, herbs, non-prescription drugs, or dietary supplements you use. Also tell them if you smoke, drink alcohol, or use illegal drugs. Some items may interact with your medicine. What should I watch for while using this medicine? Mild fever and pain should go away in 3 days or less. Report any unusual symptoms to your doctor or health care professional. What side effects may I notice from receiving this medicine? Side effects that you should report to your doctor or health care professional as soon as possible:  allergic reactions like skin rash, itching or hives, swelling of the face, lips, or tongue  breathing problems  confused  fast or irregular heartbeat  fever over 102 degrees F  seizures  unusual bleeding or bruising  unusual muscle weakness Side effects that usually do not require medical attention (report to your doctor or health care professional if they continue or are bothersome):  aches and pains  diarrhea  fever of 102 degrees F or less  headache  irritable  loss of appetite  pain,  tender at site where injected  trouble sleeping This list may not describe all possible side effects. Call your doctor for medical advice about side effects. You may report side effects to FDA at 1-800-FDA-1088. Where should I keep my medicine? This does not apply. This vaccine  is given in a clinic, pharmacy, doctor's office, or other health care setting and will not be stored at home. NOTE: This sheet is a summary. It may not cover all possible information. If you have questions about this medicine, talk to your doctor, pharmacist, or health care provider.  2021 Elsevier/Gold Standard (2014-07-09 10:27:27)  Zoster Vaccine, Recombinant injection What is this medicine? ZOSTER VACCINE (ZOS ter vak SEEN) is a vaccine used to reduce the risk of getting shingles. This vaccine is not used to treat shingles or nerve pain from shingles. This medicine may be used for other purposes; ask your health care provider or pharmacist if you have questions. COMMON BRAND NAME(S): [facility] What should I tell my health care provider before I take this medicine? They need to know if you have any of these conditions:  cancer  immune system problems  an unusual or allergic reaction to Zoster vaccine, other medications, foods, dyes, or preservatives  pregnant or trying to get pregnant  breast-feeding How should I use this medicine? This vaccine is injected into a muscle. It is given by a health care provider. A copy of Vaccine Information Statements will be given before each vaccination. Be sure to read this information carefully each time. This sheet may change often. Talk to your health care provider about the use of this vaccine in children. This vaccine is not approved for use in children. Overdosage: If you think you have taken too much of this medicine contact a poison control center or emergency room at once. NOTE: This medicine is only for you. Do not share this medicine with others. What if I miss a dose? Keep appointments for follow-up (booster) doses. It is important not to miss your dose. Call your health care provider if you are unable to keep an appointment. What may interact with this medicine?  medicines that suppress your immune system  medicines to treat  cancer  steroid medicines like prednisone or cortisone This list may not describe all possible interactions. Give your health care provider a list of all the medicines, herbs, non-prescription drugs, or dietary supplements you use. Also tell them if you smoke, drink alcohol, or use illegal drugs. Some items may interact with your medicine. What should I watch for while using this medicine? Visit your health care provider regularly. This vaccine, like all vaccines, may not fully protect everyone. What side effects may I notice from receiving this medicine? Side effects that you should report to your doctor or health care professional as soon as possible:  allergic reactions (skin rash, itching or hives; swelling of the face, lips, or tongue)  trouble breathing Side effects that usually do not require medical attention (report these to your doctor or health care professional if they continue or are bothersome):  chills  headache  fever  nausea  pain, redness, or irritation at site where injected  tiredness  vomiting This list may not describe all possible side effects. Call your doctor for medical advice about side effects. You may report side effects to FDA at 1-800-FDA-1088. Where should I keep my medicine? This vaccine is only given by a health care provider. It will not be stored at home. NOTE: This sheet is a  summary. It may not cover all possible information. If you have questions about this medicine, talk to your doctor, pharmacist, or health care provider.  2021 Elsevier/Gold Standard (2019-11-07 16:23:07)

## 2021-01-14 ENCOUNTER — Telehealth: Payer: Self-pay | Admitting: Internal Medicine

## 2021-01-14 NOTE — Telephone Encounter (Signed)
Reviewed BP log some highs having BP 120s to 130s to 149 max/59-84  Is she agreeable to increase lisinopril 20 mg daily to 30 mg daily ? And continue to monitor  Also taking metoprolol daily 25 mg   Mail back please and have her continue to monitor

## 2021-01-14 NOTE — Telephone Encounter (Signed)
Placed on your desk. 

## 2021-01-14 NOTE — Telephone Encounter (Signed)
Patient dropped off bp readings. Readings are up front in Dr. Claris Gladden color folder.

## 2021-01-18 NOTE — Telephone Encounter (Signed)
Left message to return call. Patient husband answered and will ask her to call us back

## 2021-01-20 ENCOUNTER — Other Ambulatory Visit: Payer: Self-pay | Admitting: Internal Medicine

## 2021-01-20 DIAGNOSIS — E119 Type 2 diabetes mellitus without complications: Secondary | ICD-10-CM

## 2021-01-20 MED ORDER — METFORMIN HCL ER 500 MG PO TB24: 500.0000 mg | ORAL_TABLET | Freq: Every day | ORAL | 3 refills | Status: DC

## 2021-01-20 NOTE — Telephone Encounter (Signed)
Metformin 500 xr daily with food  Rybelsus l3 mg daily low dose oral pill may cause GI side effect but may get more wt loss with rybelsus  Metformin is cheaper option   Which does she want to try?

## 2021-01-20 NOTE — Telephone Encounter (Signed)
Not interested in increasing lisinopril at the moment but still taking metoprolol. Interested in metformin due to fluctuating weight and sugar levels. Would like to discuss options. Working on making better diet choices as well as exercise.

## 2021-01-20 NOTE — Telephone Encounter (Signed)
Pt would like to do metformin. Uses [facility], prefers 90 day rx if possible.

## 2021-02-15 ENCOUNTER — Other Ambulatory Visit: Payer: Self-pay

## 2021-02-15 ENCOUNTER — Ambulatory Visit (INDEPENDENT_AMBULATORY_CARE_PROVIDER_SITE_OTHER): Payer: Medicare Other | Admitting: Internal Medicine

## 2021-02-15 ENCOUNTER — Encounter: Payer: Self-pay | Admitting: Internal Medicine

## 2021-02-15 VITALS — BP 126/84 | HR 89 | Temp 98.1°F | Ht <= 58 in | Wt 143.8 lb

## 2021-02-15 DIAGNOSIS — S40021A Contusion of right upper arm, initial encounter: Secondary | ICD-10-CM

## 2021-02-15 DIAGNOSIS — T148XXA Other injury of unspecified body region, initial encounter: Secondary | ICD-10-CM

## 2021-02-15 DIAGNOSIS — W19XXXA Unspecified fall, initial encounter: Secondary | ICD-10-CM

## 2021-02-15 DIAGNOSIS — N950 Postmenopausal bleeding: Secondary | ICD-10-CM

## 2021-02-15 MED ORDER — MUPIROCIN 2 % EX OINT: 1.0000 "application " | TOPICAL_OINTMENT | Freq: Three times a day (TID) | CUTANEOUS | 2 refills | Status: DC

## 2021-02-15 MED ORDER — DOXYCYCLINE HYCLATE 100 MG PO TABS: 100.0000 mg | ORAL_TABLET | Freq: Two times a day (BID) | ORAL | 0 refills | Status: DC
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
## 2021-02-15 NOTE — Progress Notes (Addendum)
Chief Complaint  Patient presents with   Fall   Wound Check   Knee Injury   Fall 01/10/21 in garage due to husband not parking his spot in normally and she hit her knee on his bumper fell and loss balance hitting the grille of his care and a flower pot she braced her fall with her right inner arm did not hit her head no broken bones but has large bruise right forearm with abrasions multiple arms b/l and left knee with bloody to yellow drainage 6/10 left knee pain tried otc hydrogen peroxide antibiotic oint and coban with telfa and wound care otc daughter wanted her to come get checkd out she denies LOC with fall and was mechanical Tdap utd 07/30/18  Review of Systems  Constitutional: Negative for weight loss.  HENT: Negative for hearing loss.   Eyes: Negative for blurred vision.  Respiratory: Negative for shortness of breath.   Cardiovascular: Negative for chest pain.  Musculoskeletal: Positive for joint pain.  Skin: Negative for rash.       Multiple open wounds  Neurological: Negative for loss of consciousness.   Past Medical History:  Diagnosis Date   Allergy    Anxiety    Chicken pox    Collagenous colitis    Dr. Allen Norris    Colon polyps    COVID-19    11/02/20 from husband pnd + with sxs   DDD (degenerative disc disease), cervical    had PT in the past, noted imaging 05/31/10    Depression    Fatty liver    Ganglion cyst of wrist, left    Hemorrhoids    Hemorrhoids    History of colon polyps    History of prediabetes    Hyperlipidemia 07/06/2015   Hypertension    IBS (irritable bowel syndrome) 07/06/2015   NS (neck stiffness)    xray shows something at "C3" approx 4 yrs ago. Had PT. Helped.   Polycythemia, secondary 07/06/2015   Prediabetes    SCC (squamous cell carcinoma)    scalp s/p mohs 11/07/2018 [facility] Dr. Manley Mason   Urinary incontinence    Vitamin D deficiency 07/06/2015   Past Surgical History:  Procedure Laterality Date   COLONOSCOPY  2006   COLONOSCOPY WITH  PROPOFOL N/A 08/12/2015   Procedure: COLONOSCOPY WITH PROPOFOL;  Surgeon: [name], MD;  Location: [facility];  Service: Endoscopy;  Laterality: N/A;  PT WOULD LIKE EARLY AM PER JS   EYE SURGERY     right eye 05/17/20 and 06/07/20 Dr. Valma Cava my eye MD f/u 06/2020 cataract surgery narrow angles   HYSTEROSALPINGOGRAM     1983   TONSILLECTOMY AND ADENOIDECTOMY     age [age]   Family History  Problem Relation Age of Onset   Breast cancer Maternal Grandmother 75   Cancer Maternal Grandmother        breast   Hypertension Father    Hyperlipidemia Father    COPD Father    Hearing loss Father    Heart disease Father    Heart attack Father    Diabetes Mother    Hyperlipidemia Mother    Stroke Mother    Thyroid disease Daughter    Heart disease Maternal Grandfather    Heart attack Maternal Grandfather    Heart disease Paternal Grandfather    Heart attack Paternal Grandfather    Osteoporosis Other    Heart disease Paternal Grandmother    Social History   Socioeconomic History   Marital status: Married  Spouse name: Not on file   Number of children: Not on file   Years of education: Not on file   Highest education level: Not on file  Occupational History   Not on file  Tobacco Use   Smoking status: Former Smoker    Packs/day: 1.25    Years: 42.00    Pack years: 52.50    Quit date: 04/03/2014    Years since quitting: 6.8   Smokeless tobacco: Never Used  Substance and Sexual Activity   Alcohol use: Yes    Alcohol/week: 0.0 standard drinks    Comment: wine daily   Drug use: No   Sexual activity: Not Currently  Other Topics Concern   Not on file  Social History Narrative   Married    Some college, retired    2 daughters    No guns    Wears seat belt    Safe in relationship    Social Determinants of Radio broadcast assistant Strain: Not on file  Food Insecurity: Not on file  Transportation Needs: Not on file  Physical Activity: Not on file  Stress: Not on  file  Social Connections: Not on file  Intimate Partner Violence: Not on file   Current Meds  Medication Sig   aspirin 81 MG tablet Take 81 mg by mouth every other day.    blood glucose meter kit and supplies Dispense based on patient and insurance preference. Use up to four times daily as directed. (FOR ICD-10 E10.9, E11.9).   budesonide (ENTOCORT EC) 3 MG 24 hr capsule Take one tablet (3MG) by mouth every two days   Calcium Carb-Cholecalciferol 600-400 MG-UNIT TABS Calcium 600 + D(3) 600 mg-10 mcg (400 unit) tablet   Cholecalciferol (VITAMIN D) 2000 units CAPS Vitamin D3 2,000 unit capsule   1 capsule every day by oral route.   CONTOUR NEXT TEST test strip Qd to bid contour brand   diphenhydrAMINE (BENADRYL) 50 MG tablet Take 50 mg by mouth at bedtime as needed for sleep.    doxycycline (VIBRA-TABS) 100 MG tablet Take 1 tablet (100 mg total) by mouth 2 (two) times daily. With food   lisinopril (ZESTRIL) 20 MG tablet Take 1 tablet (20 mg total) by mouth daily.   magnesium 30 MG tablet Take 250 mg by mouth daily.   metFORMIN (GLUCOPHAGE XR) 500 MG 24 hr tablet Take 1 tablet (500 mg total) by mouth daily with breakfast.   metoprolol tartrate (LOPRESSOR) 25 MG tablet Take 1 tablet (25 mg total) by mouth daily. QHS   Microlet Lancets MISC Qd to bid check blood sugar e11.59 contour brand   Multiple Vitamin (MULTIVITAMIN) tablet Take 1 tablet by mouth every other day.    mupirocin ointment (BACTROBAN) 2 % Apply 1 application topically 3 (three) times daily. Open wounds arms and legs   selenium 50 MCG TABS tablet Take 50 mcg by mouth daily.   simvastatin (ZOCOR) 20 MG tablet Take 1 tablet (20 mg total) by mouth daily at 6 PM.   Allergies  Allergen Reactions   Adhesive [Tape] Other (See Comments)    Severe skin breakdown;paper tape    Lactase Diarrhea   Lipitor [Atorvastatin] Swelling    Swelling and leg pain   Neomycin Other (See Comments)    Nausea     Pollen Extract Other (See  Comments)   Levaquin [Levofloxacin In D5w] Nausea And Vomiting    Lightheadedness    Recent Results (from the past 2160 hour(s))  Hemoglobin  A1c     Status: None   Collection Time: 12/17/20  9:09 AM  Result Value Ref Range   Hgb A1c MFr Bld 6.5 4.6 - 6.5 %    Comment: Glycemic Control Guidelines for People with Diabetes:Non Diabetic:  <6%Goal of Therapy: <7%Additional Action Suggested:  >8%   Urinalysis, Routine w reflex microscopic     Status: None   Collection Time: 12/17/20  9:09 AM  Result Value Ref Range   Color, Urine YELLOW YELLOW   APPearance CLEAR CLEAR   Specific Gravity, Urine 1.008 1.001 - 1.03   pH 7.0 5.0 - 8.0   Glucose, UA NEGATIVE NEGATIVE   Bilirubin Urine NEGATIVE NEGATIVE   Ketones, ur NEGATIVE NEGATIVE   Hgb urine dipstick NEGATIVE NEGATIVE   Protein, ur NEGATIVE NEGATIVE   Nitrite NEGATIVE NEGATIVE   Leukocytes,Ua NEGATIVE NEGATIVE  TSH     Status: None   Collection Time: 12/17/20  9:09 AM  Result Value Ref Range   TSH 2.03 0.35 - 4.50 uIU/mL  CBC with Differential/Platelet     Status: None   Collection Time: 12/17/20  9:09 AM  Result Value Ref Range   WBC 7.0 4.0 - 10.5 K/uL   RBC 4.45 3.87 - 5.11 Mil/uL   Hemoglobin 13.9 12.0 - 15.0 g/dL   HCT 41.1 36.0 - 46.0 %   MCV 92.2 78.0 - 100.0 fl   MCHC 33.8 30.0 - 36.0 g/dL   RDW 14.0 11.5 - 15.5 %   Platelets 275.0 150.0 - 400.0 K/uL   Neutrophils Relative % 62.2 43.0 - 77.0 %   Lymphocytes Relative 27.9 12.0 - 46.0 %   Monocytes Relative 7.1 3.0 - 12.0 %   Eosinophils Relative 2.1 0.0 - 5.0 %   Basophils Relative 0.7 0.0 - 3.0 %   Neutro Abs 4.3 1.4 - 7.7 K/uL   Lymphs Abs 1.9 0.7 - 4.0 K/uL   Monocytes Absolute 0.5 0.1 - 1.0 K/uL   Eosinophils Absolute 0.1 0.0 - 0.7 K/uL   Basophils Absolute 0.0 0.0 - 0.1 K/uL  Comprehensive metabolic panel     Status: Abnormal   Collection Time: 12/17/20  9:09 AM  Result Value Ref Range   Sodium 138 135 - 145 mEq/L   Potassium 4.5 3.5 - 5.1 mEq/L   Chloride  101 96 - 112 mEq/L   CO2 27 19 - 32 mEq/L   Glucose, Bld 123 (H) 70 - 99 mg/dL   BUN 14 6 - 23 mg/dL   Creatinine, Ser 0.77 0.40 - 1.20 mg/dL   Total Bilirubin 0.7 0.2 - 1.2 mg/dL   Alkaline Phosphatase 52 39 - 117 U/L   AST 31 0 - 37 U/L   ALT 37 (H) 0 - 35 U/L   Total Protein 6.9 6.0 - 8.3 g/dL   Albumin 4.5 3.5 - 5.2 g/dL   GFR 81.01 >60.00 mL/min    Comment: Calculated using the CKD-EPI Creatinine Equation (2021)   Calcium 9.8 8.4 - 10.5 mg/dL  Lipid panel     Status: None   Collection Time: 12/17/20  9:09 AM  Result Value Ref Range   Cholesterol 139 0 - 200 mg/dL    Comment: ATP III Classification       Desirable:  < 200 mg/dL               Borderline High:  200 - 239 mg/dL          High:  > = 240 mg/dL   Triglycerides  145.0 0.0 - 149.0 mg/dL    Comment: Normal:  <150 mg/dLBorderline High:  150 - 199 mg/dL   HDL 54.10 >39.00 mg/dL   VLDL 29.0 0.0 - 40.0 mg/dL   LDL Cholesterol 56 0 - 99 mg/dL   Total CHOL/HDL Ratio 3     Comment:                Men          Women1/2 Average Risk     3.4          3.3Average Risk          5.0          4.42X Average Risk          9.6          7.13X Average Risk          15.0          11.0                       NonHDL 84.74     Comment: NOTE:  Non-HDL goal should be 30 mg/dL higher than patient's LDL goal (i.e. LDL goal of < 70 mg/dL, would have non-HDL goal of < 100 mg/dL)   Objective  Body mass index is 31.12 kg/m. Wt Readings from Last 3 Encounters:  02/15/21 143 lb 12.8 oz (65.2 kg)  12/21/20 141 lb 2 oz (64 kg)  08/03/20 137 lb (62.1 kg)   Temp Readings from Last 3 Encounters:  02/15/21 98.1 F (36.7 C) (Oral)  12/21/20 98.6 F (37 C)  06/23/20 98.9 F (37.2 C) (Oral)   BP Readings from Last 3 Encounters:  02/15/21 126/84  12/21/20 130/86  08/03/20 116/82   Pulse Readings from Last 3 Encounters:  02/15/21 89  12/21/20 72  08/03/20 73    Physical Exam Vitals and nursing note reviewed.  Constitutional:      Appearance:  Normal appearance. She is well-developed and well-groomed. She is obese.  HENT:     Head: Normocephalic and atraumatic.  Cardiovascular:     Rate and Rhythm: Normal rate and regular rhythm.     Heart sounds: Normal heart sounds. No murmur heard.   Pulmonary:     Effort: Pulmonary effort is normal.     Breath sounds: Normal breath sounds.  Abdominal:     General: Abdomen is flat. Bowel sounds are normal.     Tenderness: There is no abdominal tenderness.  Skin:    General: Skin is warm and dry.     Findings: Abrasion, bruising, ecchymosis and wound present.       Neurological:     General: No focal deficit present.     Mental Status: She is alert and oriented to person, place, and time. Mental status is at baseline.     Gait: Gait normal.  Psychiatric:        Attention and Perception: Attention and perception normal.        Mood and Affect: Mood and affect normal.        Speech: Speech normal.        Behavior: Behavior normal. Behavior is cooperative.        Thought Content: Thought content normal.        Cognition and Memory: Cognition normal.        Judgment: Judgment normal.     Assessment  Plan  Fall, initial encounter 01/10/21 Abrasion, multiple arms and legs with hematoma  right forearm no obvious fractures of bone or significant injuries - Plan: mupirocin ointment (BACTROBAN) 2 %, doxycycline (VIBRA-TABS) 100 MG tablet bid x 7-10 days  bactine max spray  Antibacterial soap dove  telfa and coban  Today we cleaned with hydrogen peroxide put triple Abx oint telfa and coban redressed like the patient had prior to the visit  Call back if knee pain left does not resolve will Xray   My chart message 03/28/21   Please advise, okay for referral or does Patient need an appointment here first?       Note     Chrismon, Caryl Pina L routed conversation to [employer], [place] (1:53 PM)   Chrismon, Ria Clock (1:53 PM)   AC  Pt called to follow up on  mychart message       Note    Juliyah, Mergen "Juliann Pulse"  You Yesterday (11:06 AM)    On Friday or Sat. noticed watery bleeding from vagina. The bleeding is light, but wearing a pad. Will you refer me to a gyn/ob so I can be seen for this  [facility] [place]   HM utd flu Tdap utd  Consider shingrix in future ? If will get this given info about shingrix  twinrix 3/3 had hep B immune  -consider hep A testing in future covid vaccine declines had covid 11/02/20 prevnar declines today given info today 12/21/20   mammo 06/03/19 neg ordered neg 06/03/20 neg   DEXA 06/11/18 osteopenia repeat 06/11/21    Colonoscopy [facility] 08/12/15 IH and + collagenous colitis  -will rec pt f/u with GI for these medications budesonide in future. Also h/o colon polyps    Pap negative 07/18/19    dermatology [facility] SCC scalp [address] saw 06/2019 f/u 05/2020 f/u 06/16/21   Former smoker quit 2015 total 16 years max 1.5 ppd no FH lung cancer  -CT chest had 05/2018, 07/16/19 stable  07/2020   IMPRESSION: 1. Lung-RADS 2S, benign appearance or behavior. Continue annual screening with low-dose chest CT without contrast in 12 months. 2. The "S" modifier above refers to potentially clinically significant non lung cancer related findings. Specifically, there is aortic atherosclerosis, in addition to left main and 3 vessel coronary artery disease. Please note that although the presence of coronary artery calcium documents the presence of coronary artery disease, the severity of this disease and any potential stenosis cannot be assessed on this non-gated CT examination. Assessment for potential risk factor modification, dietary therapy or pharmacologic therapy may be warranted, if clinically indicated. 3. Mild diffuse bronchial wall thickening with very mild centrilobular and paraseptal emphysema; imaging findings suggestive of underlying COPD. 4. There are calcifications of the  mitral annulus. Echocardiographic correlation for evaluation of potential valvular dysfunction may be warranted if clinically indicated.   Aortic Atherosclerosis (ICD10-I70.0) and Emphysema (ICD10-J43.9).  -->  Quit smoking in 2015   Cards Dr. Rockey Situ due 07/2021    Last eye exam 2019 [facility] 01/2020 Dr. Valma Cava narrow angle had cataract surgery 05/2020-call to schedule f/u   rec healthy diet and exercise  Provider: Dr. Olivia Mackie McLean-Scocuzza-Internal Medicine

## 2021-02-15 NOTE — Patient Instructions (Addendum)
Dove antibacterial body wash  bactine max spray for wounds [facility] with lidocaine and antiseptic for open wounds   Hematoma A hematoma is a collection of blood under the skin, in an organ, in a body space, in a joint space, or in other tissue. The blood can thicken (clot) to form a lump that you can see and feel. The lump is often firm and may become sore and tender. Most hematomas get better in a few days to weeks. However, some hematomas may be serious and require medical care. Hematomas can range from very small to very large. What are the causes? This condition is caused by:  A blunt or penetrating injury.  A leakage from a blood vessel under the skin.  Some medical procedures, including surgeries, such as oral surgery, face lifts, and surgeries on the joints.  Some medical conditions that cause bleeding or bruising. There may be multiple hematomas that appear in different areas of the body. What increases the risk? You are more likely to develop this condition if:  You are an older adult.  You use blood thinners. What are the signs or symptoms? Symptoms of this condition depend on where the hematoma is located.  Common symptoms of a hematoma that is under the skin include:  A firm lump on the body.  Pain and tenderness in the area.  Bruising. Blue, dark blue, purple-red, or yellowish skin (discoloration) may appear at the site of the hematoma if the hematoma is close to the surface of the skin. Common symptoms of a hematoma that is deep in the tissues or body spaces may be less obvious. They include:  A collection of blood in the stomach (intra-abdominal hematoma). This may cause pain in the abdomen, weakness, fainting, and shortness of breath.  A collection of blood in the head (intracranial hematoma). This may cause a headache or symptoms such as weakness, trouble speaking or understanding, or a change in consciousness.   How is this diagnosed? This condition is  diagnosed based on:  Your medical history.  A physical exam.  Imaging tests, such as an ultrasound or CT scan. These may be needed if your health care provider suspects a hematoma in deeper tissues or body spaces.  Blood tests. These may be needed if your health care provider believes that the hematoma is caused by a medical condition. How is this treated? Treatment for this condition depends on the cause, size, and location of the hematoma. Treatment may include:  Doing nothing. The majority of hematomas do not need treatment as many of them go away on their own over time.  Surgery or close monitoring. This may be needed for large hematomas or hematomas that affect vital organs.  Medicines. Medicines may be given if there is an underlying medical cause for the hematoma. Follow these instructions at home: Managing pain, stiffness, and swelling  If directed, put ice on the affected area. ? Put ice in a plastic bag. ? Place a towel between your skin and the bag. ? Leave the ice on for 20 minutes, 2-3 times a day for the first couple of days.  If directed, apply heat to the affected area after applying ice for a couple of days. Use the heat source that your health care provider recommends, such as a moist heat pack or a heating pad. ? Place a towel between your skin and the heat source. ? Leave the heat on for 20-30 minutes. ? Remove the heat if your skin turns bright  red. This is especially important if you are unable to feel pain, heat, or cold. You may have a greater risk of getting burned.  Raise (elevate) the affected area above the level of your heart while you are sitting or lying down.  If told, wrap the affected area with an elastic bandage. The bandage applies pressure (compression) to the area, which may help to reduce swelling and promote healing. Do not wrap the bandage too tightly around the affected area.  If your hematoma is on a leg or foot (lower extremity) and is  painful, your health care provider may recommend crutches. Use them as told by your health care provider.   General instructions  Take over-the-counter and prescription medicines only as told by your health care provider.  Keep all follow-up visits as told by your health care provider. This is important. Contact a health care provider if:  You have a fever.  The swelling or discoloration gets worse.  You develop more hematomas. Get help right away if:  Your pain is worse or your pain is not controlled with medicine.  Your skin over the hematoma breaks or starts bleeding.  Your hematoma is in your chest or abdomen and you have weakness, shortness of breath, or a change in consciousness.  You have a hematoma on your scalp that is caused by a fall or injury, and you also have: ? A headache that gets worse. ? Trouble speaking or understanding speech. ? Weakness. ? Change in alertness or consciousness. Summary  A hematoma is a collection of blood under the skin, in an organ, in a body space, in a joint space, or in other tissue.  This condition usually does not need treatment because many hematomas go away on their own over time.  Large hematomas, or those that may affect vital organs, may need surgical drainage or monitoring. If the hematoma is caused by a medical condition, medicines may be prescribed.  Get help right away if your hematoma breaks or starts to bleed, you have shortness of breath, or you have a headache or trouble speaking after a fall. This information is not intended to replace advice given to you by your health care provider. Make sure you discuss any questions you have with your health care provider. Document Revised: 02/26/2019 Document Reviewed: 03/07/2018 Elsevier Patient Education  2021 [place], Adult Taking care of your wound properly can help to prevent pain, infection, and scarring. It can also help your wound heal more quickly. Follow  instructions from your health care provider about how to care for your wound. Supplies needed:  Soap and water.  Wound cleanser.  Gauze.  If needed, a clean bandage (dressing) or other type of wound dressing material to cover or place in the wound. Follow your health care provider's instructions about what dressing supplies to use.  Cream or ointment to apply to the wound, if told by your health care provider. How to care for your wound Cleaning the wound Ask your health care provider how to clean the wound. This may include:  Using mild soap and water or a wound cleanser.  Using a clean gauze to pat the wound dry after cleaning it. Do not rub or scrub the wound. Dressing care  Wash your hands with soap and water for at least 20 seconds before and after you change the dressing. If soap and water are not available, use hand sanitizer.  Change your dressing as told by your health care  provider. This may include: ? Cleaning or rinsing out (irrigating) the wound. ? Placing a dressing over the wound or in the wound (packing). ? Covering the wound with an outer dressing.  Leave any stitches (sutures), skin glue, or adhesive strips in place. These skin closures may need to stay in place for 2 weeks or longer. If adhesive strip edges start to loosen and curl up, you may trim the loose edges. Do not remove adhesive strips completely unless your health care provider tells you to do that.  Ask your health care provider when you can leave the wound uncovered. Checking for infection Check your wound area every day for signs of infection. Check for:  More redness, swelling, or pain.  Fluid or blood.  Warmth.  Pus or a bad smell.   Follow these instructions at home Medicines  If you were prescribed an antibiotic medicine, cream, or ointment, take or apply it as told by your health care provider. Do not stop using the antibiotic even if your condition improves.  If you were prescribed  pain medicine, take it 30 minutes before you do any wound care or as told by your health care provider.  Take over-the-counter and prescription medicines only as told by your health care provider. Eating and drinking  Eat a diet that includes protein, vitamin A, vitamin C, and other nutrient-rich foods to help the wound heal. ? Foods rich in protein include meat, fish, eggs, dairy, beans, and nuts. ? Foods rich in vitamin A include carrots and dark green, leafy vegetables. ? Foods rich in vitamin C include citrus fruits, tomatoes, broccoli, and peppers.  Drink enough fluid to keep your urine pale yellow. General instructions  Do not take baths, swim, use a hot tub, or do anything that would put the wound underwater until your health care provider approves. Ask your health care provider if you may take showers. You may only be allowed to take sponge baths.  Do not scratch or pick at the wound. Keep it covered as told by your health care provider.  Return to your normal activities as told by your health care provider. Ask your health care provider what activities are safe for you.  Protect your wound from the sun when you are outside for the first 6 months, or for as long as told by your health care provider. Cover up the scar area or apply sunscreen that has an SPF of at least 49.  Do not use any products that contain nicotine or tobacco, such as cigarettes, e-cigarettes, and chewing tobacco. These may delay wound healing. If you need help quitting, ask your health care provider.  Keep all follow-up visits as told by your health care provider. This is important. Contact a health care provider if:  You received a tetanus shot and you have swelling, severe pain, redness, or bleeding at the injection site.  Your pain is not controlled with medicine.  You have any of these signs of infection: ? More redness, swelling, or pain around the wound. ? Fluid or blood coming from the  wound. ? Warmth coming from the wound. ? Pus or a bad smell coming from the wound. ? A fever or chills.  You are nauseous or you vomit.  You are dizzy. Get help right away if:  You have a red streak of skin near the area around your wound.  Your wound has been closed with staples, sutures, skin glue, or adhesive strips and it begins to open up  and separate.  Your wound is bleeding, and the bleeding does not stop with gentle pressure.  You have a rash.  You faint.  You have trouble breathing. These symptoms may represent a serious problem that is an emergency. Do not wait to see if the symptoms will go away. Get medical help right away. Call your local emergency services (911 in the [place] Do not drive yourself to the hospital. Summary  Always wash your hands with soap and water for at least 20 seconds before and after changing your dressing.  Change your dressing as told by your health care provider.  To help with healing, eat foods that are rich in protein, vitamin A, vitamin C, and other nutrients.  Check your wound every day for signs of infection. Contact your health care provider if you suspect that your wound is infected. This information is not intended to replace advice given to you by your health care provider. Make sure you discuss any questions you have with your health care provider. Document Revised: 07/18/2019 Document Reviewed: 07/18/2019 Elsevier Patient Education  2021 [employer].

## 2021-03-28 ENCOUNTER — Encounter: Payer: Self-pay | Admitting: Internal Medicine

## 2021-03-28 DIAGNOSIS — N939 Abnormal uterine and vaginal bleeding, unspecified: Secondary | ICD-10-CM

## 2021-03-28 NOTE — Telephone Encounter (Signed)
Please advise, okay for referral or does Patient need an appointment here first?

## 2021-03-28 NOTE — Telephone Encounter (Signed)
Pt called to follow up on mychart message

## 2021-03-29 ENCOUNTER — Telehealth: Payer: Self-pay | Admitting: Family

## 2021-03-29 DIAGNOSIS — N950 Postmenopausal bleeding: Secondary | ICD-10-CM | POA: Insufficient documentation

## 2021-03-29 NOTE — Telephone Encounter (Signed)
Thank you for your help referred [facility] [place]  If wants to keep [facility] ob/gyn referral and cancel if gets sooner PFW ok

## 2021-03-29 NOTE — Addendum Note (Signed)
Addended by: Orland Mustard on: 03/29/2021 06:28 PM   Modules accepted: Orders

## 2021-03-29 NOTE — Telephone Encounter (Signed)
FYI patient stated that she was able to scheduled with Dr. Leafy Ro over at [facility], but is two weeks out. I let her know that we had placed referral as well. She stated that bleeding pretty much non-existent today & Sunday as well as yesterday heavier. She said that Sunday it covered a pad then yesterday she looked with mirror. She is pretty sure that it is vaginal & was watery red, but was also dark red/brown at times. I advised that she call us if needed sooner than GYN appointment & she will see how can see her fastest rather it be Dr. Leafy Ro or someone at [facility].

## 2021-03-29 NOTE — Telephone Encounter (Signed)
Vanessa Herman, please call pt  Please let her know that I was covering for Dr Aundra Dubin yesterday and have placed ref to GYN  Please see how she is doing but I agree if she is feels strongly that is vaginal than GYN  consult is appropriate in postmenopause.   Please ensure she has no concerns rectal bleeding or concerns for this being blood from urinary tract. In those cases, I advise in person exam to determine source. You may schedule her with any of [place] this week if she thinks that it may be rectal or urine source

## 2021-05-12 ENCOUNTER — Encounter: Payer: Self-pay | Admitting: Internal Medicine

## 2021-05-18 ENCOUNTER — Telehealth: Payer: Self-pay | Admitting: Internal Medicine

## 2021-05-18 NOTE — Telephone Encounter (Signed)
"  Rejection Reason - Patient went elsewhere - SEEN BY ANOTHER OFFICE" Vanessa Herman said on May 18, 2021 11:40 AM  Msg from [facility]

## 2021-05-19 NOTE — Telephone Encounter (Signed)
Pt requested urine test done with labs tomorrow. She states that she saw Dr Leafy Ro but wants a UA with Dr [facility]

## 2021-05-20 ENCOUNTER — Other Ambulatory Visit (INDEPENDENT_AMBULATORY_CARE_PROVIDER_SITE_OTHER): Payer: Medicare Other

## 2021-05-20 ENCOUNTER — Other Ambulatory Visit: Payer: Self-pay

## 2021-05-20 DIAGNOSIS — I10 Essential (primary) hypertension: Secondary | ICD-10-CM | POA: Diagnosis not present

## 2021-05-20 DIAGNOSIS — K760 Fatty (change of) liver, not elsewhere classified: Secondary | ICD-10-CM

## 2021-05-20 DIAGNOSIS — I2510 Atherosclerotic heart disease of native coronary artery without angina pectoris: Secondary | ICD-10-CM

## 2021-05-20 DIAGNOSIS — I152 Hypertension secondary to endocrine disorders: Secondary | ICD-10-CM

## 2021-05-20 DIAGNOSIS — E538 Deficiency of other specified B group vitamins: Secondary | ICD-10-CM

## 2021-05-20 DIAGNOSIS — I700 Atherosclerosis of aorta: Secondary | ICD-10-CM | POA: Diagnosis not present

## 2021-05-20 DIAGNOSIS — E1159 Type 2 diabetes mellitus with other circulatory complications: Secondary | ICD-10-CM

## 2021-05-20 LAB — COMPREHENSIVE METABOLIC PANEL
ALT: 46 U/L — ABNORMAL HIGH (ref 0–35)
AST: 36 U/L (ref 0–37)
Albumin: 4.5 g/dL (ref 3.5–5.2)
Alkaline Phosphatase: 54 U/L (ref 39–117)
BUN: 14 mg/dL (ref 6–23)
CO2: 26 mEq/L (ref 19–32)
Calcium: 9.6 mg/dL (ref 8.4–10.5)
Chloride: 102 mEq/L (ref 96–112)
Creatinine, Ser: 0.81 mg/dL (ref 0.40–1.20)
GFR: 76.01 mL/min (ref >60.00)
Glucose, Bld: 116 mg/dL — ABNORMAL HIGH (ref 70–99)
Potassium: 4.4 mEq/L (ref 3.5–5.1)
Sodium: 137 mEq/L (ref 135–145)
Total Bilirubin: 0.6 mg/dL (ref 0.2–1.2)
Total Protein: 6.9 g/dL (ref 6.0–8.3)

## 2021-05-20 LAB — LIPID PANEL
Cholesterol: 115 mg/dL (ref 0–200)
HDL: 42.3 mg/dL (ref >39.00)
LDL Cholesterol: 43 mg/dL (ref 0–99)
NonHDL: 73.1
Total CHOL/HDL Ratio: 3
Triglycerides: 150 mg/dL — ABNORMAL HIGH (ref 0.0–149.0)
VLDL: 30 mg/dL (ref 0.0–40.0)

## 2021-05-20 LAB — CBC WITH DIFFERENTIAL/PLATELET
Basophils Absolute: 0.1 10*3/uL (ref 0.0–0.1)
Basophils Relative: 1.2 % (ref 0.0–3.0)
Eosinophils Absolute: 0.2 10*3/uL (ref 0.0–0.7)
Eosinophils Relative: 3 % (ref 0.0–5.0)
HCT: 41.2 % (ref 36.0–46.0)
Hemoglobin: 13.5 g/dL (ref 12.0–15.0)
Lymphocytes Relative: 33.3 % (ref 12.0–46.0)
Lymphs Abs: 1.9 10*3/uL (ref 0.7–4.0)
MCHC: 32.8 g/dL (ref 30.0–36.0)
MCV: 93 fl (ref 78.0–100.0)
Monocytes Absolute: 0.5 10*3/uL (ref 0.1–1.0)
Monocytes Relative: 8.3 % (ref 3.0–12.0)
Neutro Abs: 3.1 10*3/uL (ref 1.4–7.7)
Neutrophils Relative %: 54.2 % (ref 43.0–77.0)
Platelets: 281 10*3/uL (ref 150.0–400.0)
RBC: 4.43 Mil/uL (ref 3.87–5.11)
RDW: 13.5 % (ref 11.5–15.5)
WBC: 5.7 10*3/uL (ref 4.0–10.5)

## 2021-05-20 LAB — VITAMIN B12: Vitamin B-12: 337 pg/mL (ref 211–911)

## 2021-05-20 LAB — HEMOGLOBIN A1C: Hgb A1c MFr Bld: 6.9 % — ABNORMAL HIGH (ref 4.6–6.5)

## 2021-05-24 LAB — HEPATITIS A ANTIBODY, TOTAL: Hepatitis A AB,Total: REACTIVE — AB

## 2021-05-24 NOTE — Telephone Encounter (Signed)
UA was not collected at time of lab visit. Will have the patient collect at 05/25/21 appointment

## 2021-05-25 ENCOUNTER — Encounter: Payer: Self-pay | Admitting: Internal Medicine

## 2021-05-25 ENCOUNTER — Other Ambulatory Visit: Payer: Self-pay

## 2021-05-25 ENCOUNTER — Ambulatory Visit (INDEPENDENT_AMBULATORY_CARE_PROVIDER_SITE_OTHER): Payer: Medicare Other | Admitting: Internal Medicine

## 2021-05-25 ENCOUNTER — Other Ambulatory Visit (HOSPITAL_COMMUNITY)
Admission: RE | Admit: 2021-05-25 | Discharge: 2021-05-25 | Disposition: A | Discharge status: Home / Self Care | Payer: Medicare Other | Source: Ambulatory Visit | Attending: Internal Medicine | Admitting: Internal Medicine

## 2021-05-25 VITALS — BP 130/84 | HR 90 | Temp 96.5°F | Ht <= 58 in | Wt 143.6 lb

## 2021-05-25 DIAGNOSIS — N3000 Acute cystitis without hematuria: Secondary | ICD-10-CM

## 2021-05-25 DIAGNOSIS — N760 Acute vaginitis: Secondary | ICD-10-CM | POA: Insufficient documentation

## 2021-05-25 DIAGNOSIS — R102 Pelvic and perineal pain: Secondary | ICD-10-CM | POA: Diagnosis present

## 2021-05-25 DIAGNOSIS — E1159 Type 2 diabetes mellitus with other circulatory complications: Secondary | ICD-10-CM

## 2021-05-25 DIAGNOSIS — R300 Dysuria: Secondary | ICD-10-CM | POA: Diagnosis present

## 2021-05-25 DIAGNOSIS — N950 Postmenopausal bleeding: Secondary | ICD-10-CM

## 2021-05-25 DIAGNOSIS — E119 Type 2 diabetes mellitus without complications: Secondary | ICD-10-CM

## 2021-05-25 DIAGNOSIS — Z0100 Encounter for examination of eyes and vision without abnormal findings: Secondary | ICD-10-CM

## 2021-05-25 DIAGNOSIS — N952 Postmenopausal atrophic vaginitis: Secondary | ICD-10-CM | POA: Insufficient documentation

## 2021-05-25 DIAGNOSIS — I152 Hypertension secondary to endocrine disorders: Secondary | ICD-10-CM

## 2021-05-25 DIAGNOSIS — R1084 Generalized abdominal pain: Secondary | ICD-10-CM

## 2021-05-25 NOTE — Patient Instructions (Addendum)
Consider clobetasol vaginally for vaginal irritation   Consider shingrix and prevnar 13   Pneumococcal Conjugate Vaccine (Prevnar 20) Suspension for Injection What is this medication? PNEUMOCOCCAL VACCINE (NEU mo KOK al vak SEEN) is a vaccine. It prevents pneumococcus bacterial infections. These bacteria can cause serious infections like pneumonia, meningitis, and blood infections. This vaccine will not treat an infection and will not cause infection. This vaccine is recommended foradults 18 years and older. This medicine may be used for other purposes; ask your health care provider orpharmacist if you have questions. COMMON BRAND NAME(S): Prevnar 20 What should I tell my care team before I take this medication? They need to know if you have any of these conditions: bleeding disorder fever immune system problems an unusual or allergic reaction to pneumococcal vaccine, diphtheria toxoid, other vaccines, other medicines, foods, dyes, or preservatives pregnant or trying to get pregnant breast-feeding How should I use this medication? This vaccine is injected into a muscle. It is given by a health care provider. A copy of Vaccine Information Statements will be given before each vaccination. Be sure to read this information carefully each time. This sheet may changeoften. Talk to your health care provider about the use of this medicine in children.Special care may be needed. Overdosage: If you think you have taken too much of this medicine contact apoison control center or emergency room at once. NOTE: This medicine is only for you. Do not share this medicine with others. What if I miss a dose? This does not apply. This medicine is not for regular use. What may interact with this medication? medicines for cancer chemotherapy medicines that suppress your immune function steroid medicines like prednisone or cortisone This list may not describe all possible interactions. Give your health care  provider a list of all the medicines, herbs, non-prescription drugs, or dietary supplements you use. Also tell them if you smoke, drink alcohol, or use illegaldrugs. Some items may interact with your medicine. What should I watch for while using this medication? Mild fever and pain should go away in 3 days or less. Report any unusualsymptoms to your health care provider. What side effects may I notice from receiving this medication? Side effects that you should report to your doctor or health care professionalas soon as possible: allergic reactions (skin rash, itching or hives; swelling of the face, lips, or tongue) confusion fast, irregular heartbeat fever over 102 degrees F muscle weakness seizures trouble breathing unusual bruising or bleeding Side effects that usually do not require medical attention (report to yourdoctor or health care professional if they continue or are bothersome): fever of 102 degrees F or less headache joint pain muscle cramps, pain pain, tender at site where injected This list may not describe all possible side effects. Call your doctor for medical advice about side effects. You may report side effects to FDA at1-800-FDA-1088. Where should I keep my medication? This vaccine is only given by a health care provider. It will not be stored athome. NOTE: This sheet is a summary. It may not cover all possible information. If you have questions about this medicine, talk to your doctor, pharmacist, orhealth care provider.  2022 Elsevier/Gold Standard (2020-06-10 16:14:35)  Zoster Vaccine, Recombinant injection What is this medication? ZOSTER VACCINE (ZOS ter vak SEEN) is a vaccine used to reduce the risk of getting shingles. This vaccine is not used to treat shingles or nerve pain fromshingles. This medicine may be used for other purposes; ask your health care provider orpharmacist if  you have questions. COMMON BRAND NAME(S): [facility] What should I tell my care team  before I take this medication? They need to know if you have any of these conditions: cancer immune system problems an unusual or allergic reaction to Zoster vaccine, other medications, foods, dyes, or preservatives pregnant or trying to get pregnant breast-feeding How should I use this medication? This vaccine is injected into a muscle. It is given by a health care provider. A copy of Vaccine Information Statements will be given before each vaccination. Be sure to read this information carefully each time. This sheet may changeoften. Talk to your health care provider about the use of this vaccine in children.This vaccine is not approved for use in children. Overdosage: If you think you have taken too much of this medicine contact apoison control center or emergency room at once. NOTE: This medicine is only for you. Do not share this medicine with others. What if I miss a dose? Keep appointments for follow-up (booster) doses. It is important not to miss your dose. Call your health care provider if you are unable to keep anappointment. What may interact with this medication? medicines that suppress your immune system medicines to treat cancer steroid medicines like prednisone or cortisone This list may not describe all possible interactions. Give your health care provider a list of all the medicines, herbs, non-prescription drugs, or dietary supplements you use. Also tell them if you smoke, drink alcohol, or use illegaldrugs. Some items may interact with your medicine. What should I watch for while using this medication? Visit your health care provider regularly. This vaccine, like all vaccines, may not fully protect everyone. What side effects may I notice from receiving this medication? Side effects that you should report to your doctor or health care professionalas soon as possible: allergic reactions (skin rash, itching or hives; swelling of the face, lips, or tongue) trouble  breathing Side effects that usually do not require medical attention (report these toyour doctor or health care professional if they continue or are bothersome): chills headache fever nausea pain, redness, or irritation at site where injected tiredness vomiting This list may not describe all possible side effects. Call your doctor for medical advice about side effects. You may report side effects to FDA at1-800-FDA-1088. Where should I keep my medication? This vaccine is only given by a health care provider. It will not be stored athome. NOTE: This sheet is a summary. It may not cover all possible information. If you have questions about this medicine, talk to your doctor, pharmacist, orhealth care provider.  2022 Elsevier/Gold Standard (2019-11-07 16:23:07)  Pneumococcal Conjugate Vaccine (Prevnar 13) Suspension for Injection What is this medication? PNEUMOCOCCAL VACCINE (NEU mo KOK al vak SEEN) is a vaccine used to prevent pneumococcus bacterial infections. These bacteria can cause serious infections like pneumonia, meningitis, and blood infections. This vaccine will lower your chance of getting pneumonia. If you do get pneumonia, it can make your symptoms milder and your illness shorter. This vaccine will not treat an infection and will not cause infection. This vaccine is recommended for infants and youngchildren, adults with certain medical conditions, and adults 29 years or older. This medicine may be used for other purposes; ask your health care provider orpharmacist if you have questions. COMMON BRAND NAME(S): Prevnar, Prevnar 13 What should I tell my care team before I take this medication? They need to know if you have any of these conditions: bleeding problems fever immune system problems an unusual or allergic reaction  to pneumococcal vaccine, diphtheria toxoid, other vaccines, latex, other medicines, foods, dyes, or preservatives pregnant or trying to get  pregnant breast-feeding How should I use this medication? This vaccine is for injection into a muscle. It is given by a health careprofessional. A copy of Vaccine Information Statements will be given before each vaccination.Read this sheet carefully each time. The sheet may change frequently. Talk to your pediatrician regarding the use of this medicine in children. While this drug may be prescribed for children as young as 47 weeks old for selectedconditions, precautions do apply. Overdosage: If you think you have taken too much of this medicine contact apoison control center or emergency room at once. NOTE: This medicine is only for you. Do not share this medicine with others. What if I miss a dose? It is important not to miss your dose. Call your doctor or health careprofessional if you are unable to keep an appointment. What may interact with this medication? medicines for cancer chemotherapy medicines that suppress your immune function steroid medicines like prednisone or cortisone This list may not describe all possible interactions. Give your health care provider a list of all the medicines, herbs, non-prescription drugs, or dietary supplements you use. Also tell them if you smoke, drink alcohol, or use illegaldrugs. Some items may interact with your medicine. What should I watch for while using this medication? Mild fever and pain should go away in 3 days or less. Report any unusualsymptoms to your doctor or health care professional. What side effects may I notice from receiving this medication? Side effects that you should report to your doctor or health care professionalas soon as possible: allergic reactions like skin rash, itching or hives, swelling of the face, lips, or tongue breathing problems confused fast or irregular heartbeat fever over 102 degrees F seizures unusual bleeding or bruising unusual muscle weakness Side effects that usually do not require medical attention  (report to yourdoctor or health care professional if they continue or are bothersome): aches and pains diarrhea fever of 102 degrees F or less headache irritable loss of appetite pain, tender at site where injected trouble sleeping This list may not describe all possible side effects. Call your doctor for medical advice about side effects. You may report side effects to FDA at1-800-FDA-1088. Where should I keep my medication? This does not apply. This vaccine is given in a clinic, pharmacy, doctor'soffice, or other health care setting and will not be stored at home. NOTE: This sheet is a summary. It may not cover all possible information. If you have questions about this medicine, talk to your doctor, pharmacist, orhealth care provider.  2022 Elsevier/Gold Standard (2014-07-09 10:27:27)

## 2021-05-25 NOTE — Progress Notes (Signed)
Chief Complaint  Patient presents with   Follow-up   F/u  1. C/o vaginal itching and redness/warm and white discharge around urethra using progesterone 200 mg and estrace daily x 2 weeks then 2x per week per ob/gyn  DUB post menopause s/p endometrial bx 05/06/21 atrophy neg malignancy.  Will f/u Dr. Leafy Ro ob/gyn  She also c/o lower pelvic pain with DUB since 1 month intermittent mild to moderate  2. Dm 2 with htn on lis 20 mg, metformin xr 500, lopressor 25 mg bid, zocor 20 mg qd cbgs in am 130s to 150s 05/20/21 A1C 6.9 increased slightly rec healthy diet and exercise   Review of Systems  Constitutional:  Negative for weight loss.  HENT:  Negative for hearing loss.   Eyes:  Negative for blurred vision.  Respiratory:  Negative for shortness of breath.   Cardiovascular:  Negative for chest pain.  Gastrointestinal:  Positive for abdominal pain.  Genitourinary:        Vaginal irritation   Musculoskeletal:  Positive for falls. Negative for joint pain.  Skin:  Negative for rash.  Neurological:  Negative for headaches.  Psychiatric/Behavioral:  Negative for depression.   Past Medical History:  Diagnosis Date   Allergy    Anxiety    Chicken pox    Collagenous colitis    Dr. Allen Norris    Colon polyps    COVID-19    11/02/20 from husband pnd + with sxs   DDD (degenerative disc disease), cervical    had PT in the past, noted imaging 05/31/10    Depression    Fatty liver    Ganglion cyst of wrist, left    Hemorrhoids    Hemorrhoids    History of colon polyps    History of prediabetes    Hyperlipidemia 07/06/2015   Hypertension    IBS (irritable bowel syndrome) 07/06/2015   NS (neck stiffness)    xray shows something at "C3" approx 4 yrs ago. Had PT. Helped.   Polycythemia, secondary 07/06/2015   Prediabetes    SCC (squamous cell carcinoma)    scalp s/p mohs 11/07/2018 unc Dr. Manley Mason   Urinary incontinence    Vitamin D deficiency 07/06/2015   Past Surgical History:  Procedure  Laterality Date   COLONOSCOPY  2006   COLONOSCOPY WITH PROPOFOL N/A 08/12/2015   Procedure: COLONOSCOPY WITH PROPOFOL;  Surgeon: [name], MD;  Location: [facility];  Service: Endoscopy;  Laterality: N/A;  PT WOULD LIKE EARLY AM PER JS   EYE SURGERY     right eye 05/17/20 and 06/07/20 Dr. Valma Cava my eye MD f/u 06/2020 cataract surgery narrow angles   HYSTEROSALPINGOGRAM     1983   TONSILLECTOMY AND ADENOIDECTOMY     age [age]   Family History  Problem Relation Age of Onset   Breast cancer Maternal Grandmother 36   Cancer Maternal Grandmother        breast   Hypertension Father    Hyperlipidemia Father    COPD Father    Hearing loss Father    Heart disease Father    Heart attack Father    Diabetes Mother    Hyperlipidemia Mother    Stroke Mother    Thyroid disease Daughter    Heart disease Maternal Grandfather    Heart attack Maternal Grandfather    Heart disease Paternal Grandfather    Heart attack Paternal Grandfather    Osteoporosis Other    Heart disease Paternal Grandmother    Social History  Socioeconomic History   Marital status: Married    Spouse name: Not on file   Number of children: Not on file   Years of education: Not on file   Highest education level: Not on file  Occupational History   Not on file  Tobacco Use   Smoking status: Former    Packs/day: 1.25    Years: 42.00    Pack years: 52.50    Types: Cigarettes    Quit date: 04/03/2014    Years since quitting: 7.1   Smokeless tobacco: Never  Substance and Sexual Activity   Alcohol use: Yes    Alcohol/week: 0.0 standard drinks    Comment: wine daily   Drug use: No   Sexual activity: Not Currently  Other Topics Concern   Not on file  Social History Narrative   Married    Some college, retired    2 daughters    No guns    Wears seat belt    Safe in relationship    Social Determinants of Radio broadcast assistant Strain: Not on file  Food Insecurity: Not on file  Transportation  Needs: Not on file  Physical Activity: Not on file  Stress: Not on file  Social Connections: Not on file  Intimate Partner Violence: Not on file   Current Meds  Medication Sig   aspirin 81 MG tablet Take 81 mg by mouth every other day.    blood glucose meter kit and supplies Dispense based on patient and insurance preference. Use up to four times daily as directed. (FOR ICD-10 E10.9, E11.9).   Calcium Carb-Cholecalciferol 600-400 MG-UNIT TABS Calcium 600 + D(3) 600 mg-10 mcg (400 unit) tablet   Cholecalciferol (VITAMIN D) 2000 units CAPS Vitamin D3 2,000 unit capsule   1 capsule every day by oral route.   CONTOUR NEXT TEST test strip Qd to bid contour brand   diazepam (VALIUM) 5 MG tablet Take 5 mg by mouth daily.   diphenhydrAMINE (BENADRYL) 50 MG tablet Take 50 mg by mouth at bedtime as needed for sleep.    estradiol (ESTRACE) 0.1 MG/GM vaginal cream Apply fingertip size amount daily x 2 weeks and then 2 x weekly on urethra and also vaginally   lisinopril (ZESTRIL) 20 MG tablet Take 1 tablet (20 mg total) by mouth daily.   magnesium 30 MG tablet Take 250 mg by mouth every other day. Every other week   metFORMIN (GLUCOPHAGE XR) 500 MG 24 hr tablet Take 1 tablet (500 mg total) by mouth daily with breakfast.   metoprolol tartrate (LOPRESSOR) 25 MG tablet Take 1 tablet (25 mg total) by mouth daily. QHS   Microlet Lancets MISC Qd to bid check blood sugar e11.59 contour brand   Multiple Vitamin (MULTIVITAMIN) tablet Take 1 tablet by mouth every other day.    POTASSIUM PO Take 100 mcg by mouth daily.   progesterone (PROMETRIUM) 200 MG capsule Take 200 mg by mouth at bedtime.   simvastatin (ZOCOR) 20 MG tablet Take 1 tablet (20 mg total) by mouth daily at 6 PM.   Allergies  Allergen Reactions   Adhesive [Tape] Other (See Comments)    Severe skin breakdown;paper tape    Lactase Diarrhea   Lipitor [Atorvastatin] Swelling    Swelling and leg pain   Neomycin Other (See Comments)    Nausea      Pollen Extract Other (See Comments)   Levaquin [Levofloxacin In D5w] Nausea And Vomiting    Lightheadedness    Recent  Results (from the past 2160 hour(s))  Hepatitis A Ab, Total     Status: Abnormal   Collection Time: 05/20/21  9:13 AM  Result Value Ref Range   Hepatitis A AB,Total REACTIVE (A) NON-REACTIVE    Comment: . For additional information, please refer to  http://education.[facility].com/faq/FAQ202  (This link is being provided for informational/ educational purposes only.) .   Hemoglobin A1c     Status: Abnormal   Collection Time: 05/20/21  9:13 AM  Result Value Ref Range   Hgb A1c MFr Bld 6.9 (H) 4.6 - 6.5 %    Comment: Glycemic Control Guidelines for People with Diabetes:Non Diabetic:  <6%Goal of Therapy: <7%Additional Action Suggested:  >8%   B12     Status: None   Collection Time: 05/20/21  9:13 AM  Result Value Ref Range   Vitamin B-12 337 211 - 911 pg/mL  Lipid panel     Status: Abnormal   Collection Time: 05/20/21  9:13 AM  Result Value Ref Range   Cholesterol 115 0 - 200 mg/dL    Comment: ATP III Classification       Desirable:  < 200 mg/dL               Borderline High:  200 - 239 mg/dL          High:  > = 240 mg/dL   Triglycerides 150.0 (H) 0.0 - 149.0 mg/dL    Comment: Normal:  <150 mg/dLBorderline High:  150 - 199 mg/dL   HDL 42.30 >39.00 mg/dL   VLDL 30.0 0.0 - 40.0 mg/dL   LDL Cholesterol 43 0 - 99 mg/dL   Total CHOL/HDL Ratio 3     Comment:                Men          Women1/2 Average Risk     3.4          3.3Average Risk          5.0          4.42X Average Risk          9.6          7.13X Average Risk          15.0          11.0                       NonHDL 73.10     Comment: NOTE:  Non-HDL goal should be 30 mg/dL higher than patient's LDL goal (i.e. LDL goal of < 70 mg/dL, would have non-HDL goal of < 100 mg/dL)  CBC with Differential/Platelet     Status: None   Collection Time: 05/20/21  9:13 AM  Result Value Ref Range   WBC 5.7 4.0 -  10.5 K/uL   RBC 4.43 3.87 - 5.11 Mil/uL   Hemoglobin 13.5 12.0 - 15.0 g/dL   HCT 41.2 36.0 - 46.0 %   MCV 93.0 78.0 - 100.0 fl   MCHC 32.8 30.0 - 36.0 g/dL   RDW 13.5 11.5 - 15.5 %   Platelets 281.0 150.0 - 400.0 K/uL   Neutrophils Relative % 54.2 43.0 - 77.0 %   Lymphocytes Relative 33.3 12.0 - 46.0 %   Monocytes Relative 8.3 3.0 - 12.0 %   Eosinophils Relative 3.0 0.0 - 5.0 %   Basophils Relative 1.2 0.0 - 3.0 %   Neutro Abs 3.1 1.4 - 7.7 K/uL  Lymphs Abs 1.9 0.7 - 4.0 K/uL   Monocytes Absolute 0.5 0.1 - 1.0 K/uL   Eosinophils Absolute 0.2 0.0 - 0.7 K/uL   Basophils Absolute 0.1 0.0 - 0.1 K/uL  Comprehensive metabolic panel     Status: Abnormal   Collection Time: 05/20/21  9:13 AM  Result Value Ref Range   Sodium 137 135 - 145 mEq/L   Potassium 4.4 3.5 - 5.1 mEq/L   Chloride 102 96 - 112 mEq/L   CO2 26 19 - 32 mEq/L   Glucose, Bld 116 (H) 70 - 99 mg/dL   BUN 14 6 - 23 mg/dL   Creatinine, Ser 0.81 0.40 - 1.20 mg/dL   Total Bilirubin 0.6 0.2 - 1.2 mg/dL   Alkaline Phosphatase 54 39 - 117 U/L   AST 36 0 - 37 U/L   ALT 46 (H) 0 - 35 U/L   Total Protein 6.9 6.0 - 8.3 g/dL   Albumin 4.5 3.5 - 5.2 g/dL   GFR 76.01 >60.00 mL/min    Comment: Calculated using the CKD-EPI Creatinine Equation (2021)   Calcium 9.6 8.4 - 10.5 mg/dL   Objective  Body mass index is 31.07 kg/m. Wt Readings from Last 3 Encounters:  05/25/21 143 lb 9.6 oz (65.1 kg)  02/15/21 143 lb 12.8 oz (65.2 kg)  12/21/20 141 lb 2 oz (64 kg)   Temp Readings from Last 3 Encounters:  05/25/21 (!) 96.5 F (35.8 C) (Temporal)  02/15/21 98.1 F (36.7 C) (Oral)  12/21/20 98.6 F (37 C)   BP Readings from Last 3 Encounters:  05/25/21 130/84  02/15/21 126/84  12/21/20 130/86   Pulse Readings from Last 3 Encounters:  05/25/21 90  02/15/21 89  12/21/20 72    Physical Exam Vitals and nursing note reviewed.  Constitutional:      Appearance: Normal appearance. She is well-developed and well-groomed. She is  obese.  HENT:     Head: Normocephalic and atraumatic.  Cardiovascular:     Rate and Rhythm: Normal rate and regular rhythm.     Heart sounds: Normal heart sounds. No murmur heard. Pulmonary:     Effort: Pulmonary effort is normal.     Breath sounds: Normal breath sounds.  Abdominal:     Tenderness: There is abdominal tenderness in the right lower quadrant and left lower quadrant.  Skin:    General: Skin is warm and dry.  Neurological:     General: No focal deficit present.     Mental Status: She is alert and oriented to person, place, and time. Mental status is at baseline.     Gait: Gait normal.  Psychiatric:        Attention and Perception: Attention and perception normal.        Mood and Affect: Mood and affect normal.        Speech: Speech normal.        Behavior: Behavior normal. Behavior is cooperative.        Thought Content: Thought content normal.        Cognition and Memory: Cognition and memory normal.        Judgment: Judgment normal.    Assessment  Plan  Dysuria/vaginitis r/o UTI - Plan: Urine cytology ancillary only([facility]), Urine Culture Suprapubic pressure - Plan: Urine cytology ancillary only([facility]), Urine Culture F/u Dr. Leafy Ro may want to go to [facility] for gyn care in future pfw, [facility]  Vaginal atrophy - on progesterone and estrace per gyn   Generalized abdominal  pain - Plan: CT ABDOMEN PELVIS WO CONTRAST Postmenopausal bleeding - Plan: CT ABDOMEN PELVIS WO CONTRAST Endometrial bx neg 05/06/21   Diabetic eye exam ([facility]) - Plan: Ambulatory referral to Ophthalmology Hypertension associated with diabetes ([facility]) 05/20/21 A1C 6.9  lis 20 mg, metformin xr 500, lopressor 25 mg bid, zocor 20 mg qd Declines foot exam today   HM utd flu Tdap utd  Consider shingrix in future deckubes twinrix 3/3 had hep A/B immune   covid vaccine declines had covid 11/02/20 prevnar declines today given info today 12/21/20, declines 05/25/21    mammo 06/03/19 neg  ordered neg 06/03/20 neg scg 06/14/21    DEXA 06/11/18 osteopenia repeat 06/11/21  sch 06/14/21    Colonoscopy [facility] 08/12/15 IH and + collagenous colitis  -will rec pt f/u with GI for these medications budesonide in future though stopped taking 12/2020. Also h/o colon polyps    Pap negative 07/18/19    dermatology [facility] SCC scalp [address] saw 06/2019 f/u 05/2020 f/u 06/16/21   Former smoker quit 2015 total 16 years max 1.5 ppd no FH lung cancer -CT chest had 05/2018, 07/16/19 stable  07/2020   IMPRESSION: 1. Lung-RADS 2S, benign appearance or behavior. Continue annual screening with low-dose chest CT without contrast in 12 months. 2. The "S" modifier above refers to potentially clinically significant non lung cancer related findings. Specifically, there is aortic atherosclerosis, in addition to left main and 3 vessel coronary artery disease. Please note that although the presence of coronary artery calcium documents the presence of coronary artery disease, the severity of this disease and any potential stenosis cannot be assessed on this non-gated CT examination. Assessment for potential risk factor modification, dietary therapy or pharmacologic therapy may be warranted, if clinically indicated. 3. Mild diffuse bronchial wall thickening with very mild centrilobular and paraseptal emphysema; imaging findings suggestive of underlying COPD. 4. There are calcifications of the mitral annulus. Echocardiographic correlation for evaluation of potential valvular dysfunction may be warranted if clinically indicated.   Aortic Atherosclerosis (ICD10-I70.0) and Emphysema (ICD10-J43.9).  -->  Quit smoking in 2015   Cards Dr. Rockey Situ due 07/2021    Last eye exam 2019 [facility] 01/2020 Dr. Valma Cava narrow angle had cataract surgery 05/2020-call to schedule f/u referred [facility]     rec healthy diet and exercise  Provider: Dr. Olivia Mackie McLean-Scocuzza-Internal Medicine

## 2021-05-26 LAB — URINE CYTOLOGY ANCILLARY ONLY
Bacterial Vaginitis-Urine: NEGATIVE
Candida Urine: NEGATIVE

## 2021-05-26 LAB — URINE CULTURE
MICRO NUMBER:: 12225408
SPECIMEN QUALITY:: ADEQUATE

## 2021-05-26 NOTE — Telephone Encounter (Signed)
Please advise 

## 2021-05-27 NOTE — Telephone Encounter (Signed)
Please advise 

## 2021-06-02 ENCOUNTER — Other Ambulatory Visit: Payer: Self-pay

## 2021-06-02 ENCOUNTER — Ambulatory Visit
Admission: RE | Admit: 2021-06-02 | Discharge: 2021-06-02 | Disposition: A | Discharge status: Home / Self Care | Payer: Medicare Other | Source: Ambulatory Visit | Attending: Internal Medicine | Admitting: Internal Medicine

## 2021-06-02 ENCOUNTER — Encounter: Payer: Self-pay | Admitting: Internal Medicine

## 2021-06-02 DIAGNOSIS — N950 Postmenopausal bleeding: Secondary | ICD-10-CM | POA: Insufficient documentation

## 2021-06-02 DIAGNOSIS — R1084 Generalized abdominal pain: Secondary | ICD-10-CM | POA: Diagnosis present

## 2021-06-02 DIAGNOSIS — K5790 Diverticulosis of intestine, part unspecified, without perforation or abscess without bleeding: Secondary | ICD-10-CM | POA: Insufficient documentation

## 2021-06-02 DIAGNOSIS — N952 Postmenopausal atrophic vaginitis: Secondary | ICD-10-CM | POA: Insufficient documentation

## 2021-06-02 MED ORDER — IOHEXOL 300 MG/ML  SOLN
150.0000 mL | Freq: Once | INTRAMUSCULAR | Status: AC | PRN
  Administered 2021-06-02: 100 mL via INTRAVENOUS

## 2021-06-07 ENCOUNTER — Telehealth: Payer: Self-pay | Admitting: Internal Medicine

## 2021-06-07 NOTE — Telephone Encounter (Signed)
Rejection Reason - Patient did not respond" Docia Barrier said on Jun 07, 2021 8:39 AM  "LM" Neta Ehlers said on May 26, 2021 1:56 PM  Msg from [facility]

## 2021-06-14 ENCOUNTER — Ambulatory Visit
Admission: RE | Admit: 2021-06-14 | Discharge: 2021-06-14 | Disposition: A | Discharge status: Home / Self Care | Payer: Medicare Other | Source: Ambulatory Visit | Attending: Internal Medicine | Admitting: Internal Medicine

## 2021-06-14 ENCOUNTER — Telehealth: Payer: Self-pay | Admitting: Gastroenterology

## 2021-06-14 ENCOUNTER — Other Ambulatory Visit: Payer: Self-pay

## 2021-06-14 DIAGNOSIS — Z1231 Encounter for screening mammogram for malignant neoplasm of breast: Secondary | ICD-10-CM | POA: Insufficient documentation

## 2021-06-14 DIAGNOSIS — M8580 Other specified disorders of bone density and structure, unspecified site: Secondary | ICD-10-CM | POA: Diagnosis present

## 2021-06-14 DIAGNOSIS — E2839 Other primary ovarian failure: Secondary | ICD-10-CM | POA: Diagnosis present

## 2021-06-14 NOTE — Telephone Encounter (Signed)
Wants Dr. Allen Norris to look at CT Abd/pelv for his advise. Also, wants to know if she needs a EGD and Colonoscopy after seeing the CT. Please advise.

## 2021-06-15 NOTE — Telephone Encounter (Signed)
Returned pt's call regarding questioning having a colonoscopy/EGD. See Dr. Dorothey Baseman recommendation. Pt not currently having any GI issues. Will call back if anything changes.

## 2021-06-21 ENCOUNTER — Other Ambulatory Visit: Payer: Self-pay | Admitting: Internal Medicine

## 2021-06-21 ENCOUNTER — Encounter: Payer: Self-pay | Admitting: Internal Medicine

## 2021-06-21 DIAGNOSIS — N6320 Unspecified lump in the left breast, unspecified quadrant: Secondary | ICD-10-CM

## 2021-06-21 DIAGNOSIS — R928 Other abnormal and inconclusive findings on diagnostic imaging of breast: Secondary | ICD-10-CM

## 2021-06-23 ENCOUNTER — Ambulatory Visit: Payer: Medicare Other

## 2021-06-23 ENCOUNTER — Encounter: Payer: Self-pay | Admitting: Internal Medicine

## 2021-06-23 ENCOUNTER — Other Ambulatory Visit: Payer: Medicare Other

## 2021-06-24 ENCOUNTER — Ambulatory Visit
Admission: RE | Admit: 2021-06-24 | Discharge: 2021-06-24 | Disposition: A | Discharge status: Home / Self Care | Payer: Medicare Other | Source: Ambulatory Visit | Attending: Internal Medicine | Admitting: Internal Medicine

## 2021-06-24 ENCOUNTER — Other Ambulatory Visit: Payer: Self-pay

## 2021-06-24 DIAGNOSIS — N6320 Unspecified lump in the left breast, unspecified quadrant: Secondary | ICD-10-CM

## 2021-06-24 DIAGNOSIS — R928 Other abnormal and inconclusive findings on diagnostic imaging of breast: Secondary | ICD-10-CM

## 2021-06-27 ENCOUNTER — Telehealth: Payer: Self-pay | Admitting: Internal Medicine

## 2021-06-27 ENCOUNTER — Other Ambulatory Visit: Payer: Self-pay | Admitting: Internal Medicine

## 2021-06-27 DIAGNOSIS — N6320 Unspecified lump in the left breast, unspecified quadrant: Secondary | ICD-10-CM

## 2021-06-27 DIAGNOSIS — R928 Other abnormal and inconclusive findings on diagnostic imaging of breast: Secondary | ICD-10-CM

## 2021-06-27 NOTE — Telephone Encounter (Signed)
See result note for Mammogram.

## 2021-06-27 NOTE — Telephone Encounter (Signed)
Patient received a phone call from Dr Nicki Reaper over there weekend . She says it is about having a needle breast Biopsy .

## 2021-06-28 ENCOUNTER — Telehealth: Payer: Self-pay

## 2021-06-28 NOTE — Telephone Encounter (Signed)
Confirmed faxed signed Prescriber Response Form from [facility]. Form has been sent to scan.

## 2021-06-30 ENCOUNTER — Other Ambulatory Visit: Payer: Self-pay

## 2021-06-30 ENCOUNTER — Ambulatory Visit
Admission: RE | Admit: 2021-06-30 | Discharge: 2021-06-30 | Disposition: A | Discharge status: Home / Self Care | Payer: Medicare Other | Source: Ambulatory Visit | Attending: Internal Medicine | Admitting: Internal Medicine

## 2021-06-30 DIAGNOSIS — N6320 Unspecified lump in the left breast, unspecified quadrant: Secondary | ICD-10-CM

## 2021-06-30 DIAGNOSIS — R928 Other abnormal and inconclusive findings on diagnostic imaging of breast: Secondary | ICD-10-CM

## 2021-06-30 HISTORY — PX: BREAST BIOPSY: SHX20

## 2021-07-01 LAB — SURGICAL PATHOLOGY

## 2021-08-08 NOTE — Progress Notes (Signed)
Cardiology Office Note  Date:  08/09/2021   ID:  JAIDENCE Herman, DOB Jul 06, 1956, MRN [id number]  PCP:  McLean-Scocuzza, Nino Glow, MD   Chief Complaint  Patient presents with   12 month follow up     Patient c/o shortness of breath with a pounding in chest at times. Medications reviewed by the patient verbally.     HPI:  [name] is a 65 year old woman with past medical history of  Coronary calcification on CT scan Diabetes type 2 Hypertension Obesity Aortic atherosclerosis Mild in the arch,  Stopped smoking 5 yrs ago Presents for f/u of her palpitations and family history of coronary disease  BP at home 120 to 130/80 Not routinely checking pressure, she is just guessing on the numbers Only taking metoprolol tartrate 25 at night  In the afternoon, has tachycardia, palpitations,  "Not every day"  Some SOB with stairs, no exercise, "lazy", deconditioned Past few years, become  No exercise, sedentary  Reports seeing lots of doctors recently, has vaginal rash, has had biopsies Fluconazole not working  Labs reviewed with her on today's visit  total chol 115, LDL 43 HBA1C 6.9  EKG personally reviewed by myself on todays visit Normal sinus rhythm rate 82 bpm no significant ST-T wave changes  , Past medical history reviewed Echo 08/2019  EF normal  CT scan chest reviewed Coronary calcifications  FH dad and both grandparents heart dz,   Prior CT scan August 2019 Showing coronary calcification, aortic atherosclerosis  Prior carotid ultrasound August 2019 Minor carotid atherosclerosis less than 50% bilaterally   PMH:   has a past medical history of Allergy, Anxiety, Chicken pox, Collagenous colitis, Colon polyps, COVID-19, DDD (degenerative disc disease), cervical, Depression, Fatty liver, Ganglion cyst of wrist, left, Hemorrhoids, Hemorrhoids, History of colon polyps, History of prediabetes, Hyperlipidemia (07/06/2015), Hypertension, IBS (irritable bowel syndrome)  (07/06/2015), NS (neck stiffness), Polycythemia, secondary (07/06/2015), Prediabetes, SCC (squamous cell carcinoma), Urinary incontinence, and Vitamin D deficiency (07/06/2015).  PSH:    Past Surgical History:  Procedure Laterality Date   BREAST BIOPSY Left 06/30/2021   [place] bx, coil marker, path pending   COLONOSCOPY  2006   COLONOSCOPY WITH PROPOFOL N/A 08/12/2015   Procedure: COLONOSCOPY WITH PROPOFOL;  Surgeon: [name], MD;  Location: [facility];  Service: Endoscopy;  Laterality: N/A;  PT WOULD LIKE EARLY AM PER JS   EYE SURGERY     right eye 05/17/20 and 06/07/20 Dr. Valma Cava my eye MD f/u 06/2020 cataract surgery narrow angles   HYSTEROSALPINGOGRAM     1983   TONSILLECTOMY AND ADENOIDECTOMY     age [age]    Current Outpatient Medications  Medication Sig Dispense Refill   Ascorbic Acid (VITAMIN C) 1000 MG tablet Take 500 mg by mouth every other day.     aspirin 81 MG tablet Take 81 mg by mouth every other day.      blood glucose meter kit and supplies Dispense based on patient and insurance preference. Use up to four times daily as directed. (FOR ICD-10 E10.9, E11.9). 1 each 0   Calcium Carb-Cholecalciferol 600-400 MG-UNIT TABS Calcium 600 + D(3) 600 mg-10 mcg (400 unit) tablet     Cholecalciferol (VITAMIN D) 2000 units CAPS Vitamin D3 2,000 unit capsule   1 capsule every day by oral route.     CONTOUR NEXT TEST test strip Qd to bid contour brand 200 each 3   diazepam (VALIUM) 5 MG tablet Take 5 mg by mouth daily.  diphenhydrAMINE (BENADRYL) 50 MG tablet Take 50 mg by mouth at bedtime as needed for sleep.      estradiol (ESTRACE) 0.1 MG/GM vaginal cream Apply fingertip size amount daily x 2 weeks and then 2 x weekly on urethra and also vaginally     fluconazole (DIFLUCAN) 200 MG tablet Take 200 mg by mouth 2 (two) times a week.     lisinopril (ZESTRIL) 20 MG tablet Take 1 tablet (20 mg total) by mouth daily. 90 tablet 3   magnesium 30 MG tablet Take 250 mg by mouth every other  day. Every other week     metFORMIN (GLUCOPHAGE XR) 500 MG 24 hr tablet Take 1 tablet (500 mg total) by mouth daily with breakfast. 90 tablet 3   metoprolol tartrate (LOPRESSOR) 25 MG tablet Take 1 tablet (25 mg total) by mouth daily. QHS 90 tablet 3   Microlet Lancets MISC Qd to bid check blood sugar e11.59 contour brand 200 each 3   Multiple Vitamin (MULTIVITAMIN) tablet Take 1 tablet by mouth every other day.      POTASSIUM PO Take 100 mcg by mouth daily.     progesterone (PROMETRIUM) 200 MG capsule Take 200 mg by mouth at bedtime.     simvastatin (ZOCOR) 20 MG tablet Take 1 tablet (20 mg total) by mouth daily at 6 PM. 90 tablet 3   No current facility-administered medications for this visit.     Allergies:   Adhesive [tape], Lipitor [atorvastatin], Tilactase, Neomycin, Pollen extract, and Levaquin [levofloxacin in d5w]   Social History:  The patient  reports that she quit smoking about 7 years ago. Her smoking use included cigarettes. She has a 52.50 pack-year smoking history. She has never used smokeless tobacco. She reports current alcohol use. She reports that she does not use drugs.   Family History:   family history includes Breast cancer (age of onset: 72) in her maternal grandmother; COPD in her father; Cancer in her maternal grandmother; Diabetes in her mother; Hearing loss in her father; Heart attack in her father, maternal grandfather, and paternal grandfather; Heart disease in her father, maternal grandfather, paternal grandfather, and paternal grandmother; Hyperlipidemia in her father and mother; Hypertension in her father; Osteoporosis in an other family member; Stroke in her mother; Thyroid disease in her daughter.    Review of Systems: Review of Systems  Constitutional: Negative.   HENT: Negative.    Respiratory: Negative.    Cardiovascular:  Positive for palpitations.  Gastrointestinal: Negative.   Musculoskeletal: Negative.   Neurological: Negative.    Psychiatric/Behavioral: Negative.    All other systems reviewed and are negative.  PHYSICAL EXAM: VS:  BP (!) 138/100 (BP Location: Left Arm, Patient Position: Sitting, Cuff Size: Normal)   Pulse 82   Ht '4\' 10"'  (1.473 m)   Wt 140 lb 4 oz (63.6 kg)   SpO2 98%   BMI 29.31 kg/m  , BMI Body mass index is 29.31 kg/m. Constitutional:  oriented to person, place, and time. No distress.  HENT:  Head: Grossly normal Eyes:  no discharge. No scleral icterus.  Neck: No JVD, no carotid bruits  Cardiovascular: Regular rate and rhythm, no murmurs appreciated Pulmonary/Chest: Clear to auscultation bilaterally, no wheezes or rails Abdominal: Soft.  no distension.  no tenderness.  Musculoskeletal: Normal range of motion Neurological:  normal muscle tone. Coordination normal. No atrophy Skin: Skin warm and dry Psychiatric: normal affect, pleasant   Recent Labs: 12/17/2020: TSH 2.03 05/20/2021: ALT 46; BUN 14; Creatinine, Ser  0.81; Hemoglobin 13.5; Platelets 281.0; Potassium 4.4; Sodium 137    Lipid Panel Lab Results  Component Value Date   CHOL 115 05/20/2021   HDL 42.30 05/20/2021   LDLCALC 43 05/20/2021   TRIG 150.0 (H) 05/20/2021      Wt Readings from Last 3 Encounters:  08/09/21 140 lb 4 oz (63.6 kg)  05/25/21 143 lb 9.6 oz (65.1 kg)  02/15/21 143 lb 12.8 oz (65.2 kg)     ASSESSMENT AND PLAN:  Aortic atherosclerosis (HCC) Mild in the arch,  Stopped smoking >5 yrs ago Lipids at goal,  A1c 6.9 Recommended weight loss and walking program  Coronary artery calcification seen on CAT scan mild disease, LAD and LCX Aggressive diabetes control recommended , cholesterol at goal Walking program recommended Denies any anginal symptoms  Essential hypertension Long discussion concerning hypertension, she does not want to go up on her lisinopril for unclear reasons Reports it was too low on 40 mg daily Recommend she change metoprolol to tartrate to metoprolol succinate 50 mg  daily Was only taking tartrate 25 once a day in the evening but reported having palpitations in the afternoon  Controlled type 2 diabetes mellitus without complication, without long-term current use of insulin ([place]) We have encouraged continued exercise, careful diet management in an effort to lose weight.  Palpitations Changed to metoprolol as above Symptoms are rare, in the afternoon, short-lived For worsening symptoms, more frequent, could order a ZIO monitor  Nonspecific chest pain Denies significant chest pain    Total encounter time more than 25 minutes  Greater than 50% was spent in counseling and coordination of care with the patient   No orders of the defined types were placed in this encounter.    Signed, Esmond Plants, M.D., Ph.D. 08/09/2021  [place], [place]

## 2021-08-09 ENCOUNTER — Ambulatory Visit (INDEPENDENT_AMBULATORY_CARE_PROVIDER_SITE_OTHER): Payer: Medicare Other | Admitting: Cardiovascular Disease

## 2021-08-09 ENCOUNTER — Other Ambulatory Visit: Payer: Self-pay

## 2021-08-09 ENCOUNTER — Encounter: Payer: Self-pay | Admitting: Cardiovascular Disease

## 2021-08-09 VITALS — BP 138/100 | HR 82 | Ht <= 58 in | Wt 140.2 lb

## 2021-08-09 DIAGNOSIS — I700 Atherosclerosis of aorta: Secondary | ICD-10-CM | POA: Diagnosis not present

## 2021-08-09 DIAGNOSIS — I10 Essential (primary) hypertension: Secondary | ICD-10-CM | POA: Diagnosis not present

## 2021-08-09 DIAGNOSIS — I2510 Atherosclerotic heart disease of native coronary artery without angina pectoris: Secondary | ICD-10-CM

## 2021-08-09 DIAGNOSIS — E782 Mixed hyperlipidemia: Secondary | ICD-10-CM

## 2021-08-09 DIAGNOSIS — E119 Type 2 diabetes mellitus without complications: Secondary | ICD-10-CM

## 2021-08-09 MED ORDER — METOPROLOL SUCCINATE ER 50 MG PO TB24: 50.0000 mg | ORAL_TABLET | Freq: Every evening | ORAL | 3 refills | Status: DC

## 2021-08-09 NOTE — Patient Instructions (Addendum)
Medication Instructions:  Please STOP   metoprolol tartrate Please START metoprolol succinate 50 mg in the evening  If you need a refill on your cardiac medications before your next appointment, please call your pharmacy.   Lab work: No new labs needed  Testing/Procedures: No new testing needed   Follow-Up: At [facility], you and your health needs are our priority.  As part of our continuing mission to provide you with exceptional heart care, we have created designated Provider Care Teams.  These Care Teams include your primary Cardiologist (physician) and Advanced Practice Providers (APPs -  Physician Assistants and Nurse Practitioners) who all work together to provide you with the care you need, when you need it.  You will need a follow up appointment in 12 months  Providers on your designated Care Team:   Murray Hodgkins, NP Christell Faith, PA-C Cadence Kathlen Mody, [place]  COVID-19 Vaccine Information can be found at: [email or web address] For questions related to vaccine distribution or appointments, please email [email or web address] or call [phone].   Please monitor blood pressures and keep a log of your readings.  Call with numbers or upload to your MyChart account Goal <130 on the top  How to use a home blood pressure monitor. Be still. Measure at the same time every day. It's important to take the readings at the same time each day, such as morning and evening. Take reading approximately 1 1/2 to 2 hours after BP medications.   AVOID these things for 30 minutes before checking your blood pressure: Drinking caffeine. Drinking alcohol. Eating. Smoking. Exercising.

## 2021-10-03 ENCOUNTER — Encounter: Payer: Self-pay | Admitting: Cardiovascular Disease

## 2021-10-18 ENCOUNTER — Encounter: Payer: Self-pay | Admitting: Internal Medicine

## 2021-10-18 ENCOUNTER — Encounter: Payer: Self-pay | Admitting: Cardiovascular Disease

## 2021-10-18 ENCOUNTER — Other Ambulatory Visit: Payer: Self-pay | Admitting: *Deleted

## 2021-10-18 MED ORDER — METOPROLOL SUCCINATE ER 50 MG PO TB24: 50.0000 mg | ORAL_TABLET | Freq: Every evening | ORAL | 2 refills | Status: DC

## 2021-10-19 ENCOUNTER — Other Ambulatory Visit: Payer: Self-pay

## 2021-10-19 DIAGNOSIS — I10 Essential (primary) hypertension: Secondary | ICD-10-CM

## 2021-10-19 DIAGNOSIS — R7303 Prediabetes: Secondary | ICD-10-CM

## 2021-10-19 DIAGNOSIS — E785 Hyperlipidemia, unspecified: Secondary | ICD-10-CM

## 2021-10-19 MED ORDER — ACCU-CHEK GUIDE VI STRP: ORAL_STRIP | 4 refills | Status: DC

## 2021-10-19 MED ORDER — ACCU-CHEK SOFTCLIX LANCETS MISC: 4 refills | Status: DC

## 2021-10-19 MED ORDER — SIMVASTATIN 20 MG PO TABS: 20.0000 mg | ORAL_TABLET | Freq: Every day | ORAL | 3 refills | Status: DC

## 2021-10-19 MED ORDER — LISINOPRIL 20 MG PO TABS: 20.0000 mg | ORAL_TABLET | Freq: Every day | ORAL | 3 refills | Status: DC

## 2021-10-19 MED ORDER — METFORMIN HCL ER 500 MG PO TB24: 500.0000 mg | ORAL_TABLET | Freq: Every day | ORAL | 3 refills | Status: DC

## 2021-10-21 ENCOUNTER — Encounter: Payer: Self-pay | Admitting: Internal Medicine

## 2021-10-21 ENCOUNTER — Other Ambulatory Visit: Payer: Self-pay

## 2021-10-21 ENCOUNTER — Telehealth (INDEPENDENT_AMBULATORY_CARE_PROVIDER_SITE_OTHER): Payer: Medicare Other | Admitting: Internal Medicine

## 2021-10-21 DIAGNOSIS — I152 Hypertension secondary to endocrine disorders: Secondary | ICD-10-CM | POA: Diagnosis not present

## 2021-10-21 DIAGNOSIS — E1159 Type 2 diabetes mellitus with other circulatory complications: Secondary | ICD-10-CM

## 2021-10-21 DIAGNOSIS — J0110 Acute frontal sinusitis, unspecified: Secondary | ICD-10-CM

## 2021-10-21 MED ORDER — AMOXICILLIN-POT CLAVULANATE 875-125 MG PO TABS: 1.0000 | ORAL_TABLET | Freq: Two times a day (BID) | ORAL | 0 refills | Status: DC

## 2021-10-21 MED ORDER — PREDNISONE 10 MG PO TABS: ORAL_TABLET | ORAL | 0 refills | Status: DC

## 2021-10-21 NOTE — Patient Instructions (Signed)
I am treating you for bacterial sinusitis which is probably a  complication from your recent viral infection  which has caused prolonged  sinus congestion.   I am prescribing an antibiotic (augmentin  for 2 weeks ) and an 8 day  prednisone taper to manage the infection and the inflammation in your ear/sinuses.   I also advise use of the following OTC meds to help with your other symptoms.   Take generic OTC benadryl 25 mg at bedtime  for the drainage,  Sudafed PE 20 mg every 6 hours  if needed for congestion, guaifenesin (Mucinex)for the "rubber cement snot"      Please take a probiotic ( Align, Flora que or Culturelle) OR EAT YOGURT DAILY  for four weeks since you are taking an  antibiotic to prevent a very serious antibiotic associated infection  Called clostridium dificile colitis that can cause diarrhea, multi organ failure, sepsis and death if not managed.

## 2021-10-21 NOTE — Progress Notes (Addendum)
Virtual Visit CONVERTED TO TELEPHONE   This visit type was conducted due to national recommendations for restrictions regarding the COVID-19 pandemic (e.g. social distancing).  This format is felt to be most appropriate for this patient at this time.  All issues noted in this document were discussed and addressed.  No physical exam was performed (except for noted visual exam findings with Video Visits).   I connected withNAME@ on 10/21/21 at 11:00 AM EST by a video enabled telemedicine application  and verified that I am speaking with the correct person using two identifiers. Location patient: home Location provider: work or home office Persons participating in the virtual visit: patient, provider  I discussed the limitations, risks, security and privacy concerns of performing an evaluation and management service by telephone and the availability of in person appointments. I also discussed with the patient that there may be a patient responsible charge related to this service. The patient expressed understanding and agreed to proceed.  Interactive audio and video telecommunications were attempted between this provider and patient, however failed, due to patient having technical difficulties .  We continued and completed > 50% of the visit  with audio only.   Reason for visit: SINUS CONGESTION  HPI:   Has been sick on and off since November, started with husband's illness .  Dry cough,  sinus congestion and drainage for about one week  with mostly PND . Not much nighttime cough since Thanksgiving.  Woke up this morning around  1 am with productive cough sputum yellow /green  now still yellow.  Right sinus maxillary very tender, right ear is also painful.  Family had the flu at Christmas but she has not had body aches ,  fever.  Has had scratchy throat which becomes sore by 4 pm. Coughing up thick clear mucus    ROS: See pertinent positives and negatives per HPI.  Past Medical History:   Diagnosis Date   Allergy    Anxiety    Chicken pox    Collagenous colitis    Dr. Allen Norris    Colon polyps    COVID-19    11/02/20 from husband pnd + with sxs   DDD (degenerative disc disease), cervical    had PT in the past, noted imaging 05/31/10    Depression    Fatty liver    Ganglion cyst of wrist, left    Hemorrhoids    Hemorrhoids    History of colon polyps    History of prediabetes    Hyperlipidemia 07/06/2015   Hypertension    IBS (irritable bowel syndrome) 07/06/2015   NS (neck stiffness)    xray shows something at "C3" approx 4 yrs ago. Had PT. Helped.   Polycythemia, secondary 07/06/2015   Prediabetes    SCC (squamous cell carcinoma)    scalp s/p mohs 11/07/2018 [facility] Dr. Manley Mason   Urinary incontinence    Vitamin D deficiency 07/06/2015    Past Surgical History:  Procedure Laterality Date   BREAST BIOPSY Left 06/30/2021   [place] bx, coil marker, path pending   COLONOSCOPY  2006   COLONOSCOPY WITH PROPOFOL N/A 08/12/2015   Procedure: COLONOSCOPY WITH PROPOFOL;  Surgeon: [name], MD;  Location: [place];  Service: Endoscopy;  Laterality: N/A;  PT WOULD LIKE EARLY AM PER JS   EYE SURGERY     right eye 05/17/20 and 06/07/20 Dr. Valma Cava my eye MD f/u 06/2020 cataract surgery narrow angles   HYSTEROSALPINGOGRAM     1983  TONSILLECTOMY AND ADENOIDECTOMY     age 10-7    Family History  Problem Relation Age of Onset   Breast cancer Maternal Grandmother 18   Cancer Maternal Grandmother        breast   Hypertension Father    Hyperlipidemia Father    COPD Father    Hearing loss Father    Heart disease Father    Heart attack Father    Diabetes Mother    Hyperlipidemia Mother    Stroke Mother    Thyroid disease Daughter    Heart disease Maternal Grandfather    Heart attack Maternal Grandfather    Heart disease Paternal Grandfather    Heart attack Paternal Grandfather    Osteoporosis Other    Heart disease Paternal Grandmother     SOCIAL HX:  reports that  she quit smoking about 7 years ago. Her smoking use included cigarettes. She has a 52.50 pack-year smoking history. She has never used smokeless tobacco. She reports current alcohol use. She reports that she does not use drugs.    Current Outpatient Medications:    Accu-Chek Softclix Lancets lancets, Qd to bid check blood sugar e11.59, Disp: 200 each, Rfl: 4   aspirin 81 MG tablet, Take 81 mg by mouth every other day. , Disp: , Rfl:    blood glucose meter kit and supplies, Dispense based on patient and insurance preference. Use up to four times daily as directed. (FOR ICD-10 E10.9, E11.9)., Disp: 1 each, Rfl: 0   Calcium Carb-Cholecalciferol 600-400 MG-UNIT TABS, Calcium 600 + D(3) 600 mg-10 mcg (400 unit) tablet, Disp: , Rfl:    Cholecalciferol (VITAMIN D) 2000 units CAPS, Vitamin D3 2,000 unit capsule   1 capsule every day by oral route., Disp: , Rfl:    clobetasol ointment (TEMOVATE) 0.05 %, Apply topically., Disp: , Rfl:    diphenhydrAMINE (BENADRYL) 50 MG tablet, Take 50 mg by mouth at bedtime as needed for sleep. , Disp: , Rfl:    estradiol (ESTRACE) 0.1 MG/GM vaginal cream, Apply fingertip size amount daily x 2 weeks and then 2 x weekly on urethra and also vaginally, Disp: , Rfl:    glucose blood (ACCU-CHEK GUIDE) test strip, Qd to bid check blood sugar e11.59, Disp: 200 each, Rfl: 4   lisinopril (ZESTRIL) 20 MG tablet, Take 1 tablet (20 mg total) by mouth daily., Disp: 90 tablet, Rfl: 3   magnesium 30 MG tablet, Take 250 mg by mouth every other day. Every other week, Disp: , Rfl:    metFORMIN (GLUCOPHAGE XR) 500 MG 24 hr tablet, Take 1 tablet (500 mg total) by mouth daily with breakfast., Disp: 90 tablet, Rfl: 3   metoprolol succinate (TOPROL-XL) 50 MG 24 hr tablet, Take 1 tablet (50 mg total) by mouth every evening., Disp: 90 tablet, Rfl: 2   Multiple Vitamin (MULTIVITAMIN) tablet, Take 1 tablet by mouth every other day. , Disp: , Rfl:    nystatin (MYCOSTATIN/NYSTOP) powder, Apply  topically 2 (two) times daily., Disp: , Rfl:    progesterone (PROMETRIUM) 200 MG capsule, Take 200 mg by mouth at bedtime., Disp: , Rfl:    simvastatin (ZOCOR) 20 MG tablet, Take 1 tablet (20 mg total) by mouth daily at 6 PM., Disp: 90 tablet, Rfl: 3   Ascorbic Acid (VITAMIN C) 1000 MG tablet, Take 500 mg by mouth every other day. (Patient not taking: Reported on 10/21/2021), Disp: , Rfl:    diazepam (VALIUM) 5 MG tablet, Take 5 mg by mouth daily. (  Patient not taking: Reported on 10/21/2021), Disp: , Rfl:   EXAM:   General impression: alert, cooperative and articulate.  No signs of being in distress  Lungs: speech is fluent sentence length suggests that patient is not short of breath and not punctuated by cough, sneezing or sniffing. Marland Kitchen   Psych: affect normal.  speech is articulate and non pressured .  Denies suicidal thoughts      ASSESSMENT AND PLAN:  Discussed the following assessment and plan:  No diagnosis found.  No problem-specific Assessment & Plan notes found for this encounter.    I discussed the assessment and treatment plan with the patient. The patient was provided an opportunity to ask questions and all were answered. The patient agreed with the plan and demonstrated an understanding of the instructions.   The patient was advised to call back or seek an in-person evaluation if the symptoms worsen or if the condition fails to improve as anticipated.   Interactive audio and video telecommunications failed, due to patient having technical difficulties. We conducted  and completed visit with audio only  and verified that I am speaking with the correct person using two identifiers    I provided  20 minutes of non-face-to-face time during this encounter reviewing patient's current problems and post surgeries.  Providing counseling on the above mentioned problems , and coordination  of care .   Crecencio Mc, MD

## 2021-10-21 NOTE — Assessment & Plan Note (Addendum)
Last a1c elevated to 6.9 in August.  Taking metformin, liisnopril, ASA and simvastatin.  Needs pneumonia vaccine.   Lab Results  Component Value Date   HGBA1C 6.9 (H) 05/20/2021   Lab Results  Component Value Date   LABMICR Comment 05/07/2018   MICROALBUR 0.3 07/25/2019

## 2021-10-22 DIAGNOSIS — J019 Acute sinusitis, unspecified: Secondary | ICD-10-CM | POA: Insufficient documentation

## 2021-10-22 NOTE — Assessment & Plan Note (Signed)
Given chronicity of symptoms, development of facial pain and exam consistent with bacterial URI,  Will treat with empiric antibiotics, decongestants, and saline lavage.   

## 2021-10-30 ENCOUNTER — Other Ambulatory Visit: Payer: Self-pay | Admitting: Internal Medicine

## 2021-10-30 DIAGNOSIS — R7303 Prediabetes: Secondary | ICD-10-CM

## 2021-11-22 LAB — HM DIABETES EYE EXAM

## 2021-12-27 LAB — HM DIABETES EYE EXAM

## 2021-12-29 ENCOUNTER — Telehealth: Payer: Self-pay | Admitting: Acute Care

## 2021-12-29 ENCOUNTER — Ambulatory Visit (INDEPENDENT_AMBULATORY_CARE_PROVIDER_SITE_OTHER): Payer: Medicare Other | Admitting: Internal Medicine

## 2021-12-29 ENCOUNTER — Other Ambulatory Visit: Payer: Self-pay

## 2021-12-29 ENCOUNTER — Encounter: Payer: Self-pay | Admitting: Internal Medicine

## 2021-12-29 VITALS — BP 126/80 | HR 76 | Temp 98.5°F | Ht <= 58 in | Wt 141.4 lb

## 2021-12-29 DIAGNOSIS — Z122 Encounter for screening for malignant neoplasm of respiratory organs: Secondary | ICD-10-CM

## 2021-12-29 DIAGNOSIS — Z1231 Encounter for screening mammogram for malignant neoplasm of breast: Secondary | ICD-10-CM

## 2021-12-29 DIAGNOSIS — I152 Hypertension secondary to endocrine disorders: Secondary | ICD-10-CM

## 2021-12-29 DIAGNOSIS — L84 Corns and callosities: Secondary | ICD-10-CM

## 2021-12-29 DIAGNOSIS — R1310 Dysphagia, unspecified: Secondary | ICD-10-CM | POA: Insufficient documentation

## 2021-12-29 DIAGNOSIS — Z1329 Encounter for screening for other suspected endocrine disorder: Secondary | ICD-10-CM

## 2021-12-29 DIAGNOSIS — I10 Essential (primary) hypertension: Secondary | ICD-10-CM | POA: Diagnosis not present

## 2021-12-29 DIAGNOSIS — E1159 Type 2 diabetes mellitus with other circulatory complications: Secondary | ICD-10-CM

## 2021-12-29 DIAGNOSIS — K760 Fatty (change of) liver, not elsewhere classified: Secondary | ICD-10-CM | POA: Diagnosis not present

## 2021-12-29 DIAGNOSIS — E119 Type 2 diabetes mellitus without complications: Secondary | ICD-10-CM

## 2021-12-29 DIAGNOSIS — F419 Anxiety disorder, unspecified: Secondary | ICD-10-CM

## 2021-12-29 DIAGNOSIS — R590 Localized enlarged lymph nodes: Secondary | ICD-10-CM

## 2021-12-29 LAB — LIPID PANEL
Cholesterol: 141 mg/dL (ref 0–200)
HDL: 60 mg/dL (ref >39.00)
LDL Cholesterol: 44 mg/dL (ref 0–99)
NonHDL: 80.62
Total CHOL/HDL Ratio: 2
Triglycerides: 185 mg/dL — ABNORMAL HIGH (ref 0.0–149.0)
VLDL: 37 mg/dL (ref 0.0–40.0)

## 2021-12-29 LAB — COMPREHENSIVE METABOLIC PANEL WITH GFR
ALT: 37 U/L — ABNORMAL HIGH (ref 0–35)
AST: 33 U/L (ref 0–37)
Albumin: 5 g/dL (ref 3.5–5.2)
Alkaline Phosphatase: 50 U/L (ref 39–117)
BUN: 14 mg/dL (ref 6–23)
CO2: 26 meq/L (ref 19–32)
Calcium: 10.3 mg/dL (ref 8.4–10.5)
Chloride: 99 meq/L (ref 96–112)
Creatinine, Ser: 0.8 mg/dL (ref 0.40–1.20)
GFR: 76.82 mL/min
Glucose, Bld: 104 mg/dL — ABNORMAL HIGH (ref 70–99)
Potassium: 4.2 meq/L (ref 3.5–5.1)
Sodium: 136 meq/L (ref 135–145)
Total Bilirubin: 0.7 mg/dL (ref 0.2–1.2)
Total Protein: 7.8 g/dL (ref 6.0–8.3)

## 2021-12-29 LAB — CBC WITH DIFFERENTIAL/PLATELET
Basophils Absolute: 0.1 10*3/uL (ref 0.0–0.1)
Basophils Relative: 0.9 % (ref 0.0–3.0)
Eosinophils Absolute: 0.1 10*3/uL (ref 0.0–0.7)
Eosinophils Relative: 1.8 % (ref 0.0–5.0)
HCT: 42.5 % (ref 36.0–46.0)
Hemoglobin: 14.4 g/dL (ref 12.0–15.0)
Lymphocytes Relative: 36.2 % (ref 12.0–46.0)
Lymphs Abs: 2.5 10*3/uL (ref 0.7–4.0)
MCHC: 34 g/dL (ref 30.0–36.0)
MCV: 93 fl (ref 78.0–100.0)
Monocytes Absolute: 0.4 10*3/uL (ref 0.1–1.0)
Monocytes Relative: 6.4 % (ref 3.0–12.0)
Neutro Abs: 3.8 10*3/uL (ref 1.4–7.7)
Neutrophils Relative %: 54.7 % (ref 43.0–77.0)
Platelets: 303 10*3/uL (ref 150.0–400.0)
RBC: 4.56 Mil/uL (ref 3.87–5.11)
RDW: 13.4 % (ref 11.5–15.5)
WBC: 7 10*3/uL (ref 4.0–10.5)

## 2021-12-29 LAB — HEMOGLOBIN A1C: Hgb A1c MFr Bld: 6.8 % — ABNORMAL HIGH (ref 4.6–6.5)

## 2021-12-29 LAB — TSH: TSH: 2.19 u[IU]/mL (ref 0.35–5.50)

## 2021-12-29 MED ORDER — DIAZEPAM 5 MG PO TABS: 5.0000 mg | ORAL_TABLET | Freq: Every day | ORAL | 2 refills | Status: DC | PRN

## 2021-12-29 NOTE — Telephone Encounter (Signed)
Attempted to reach pt; 1st attempt phone was answered and then hung up. 2nd attempt it went to voicemail with message left. ?

## 2021-12-29 NOTE — Progress Notes (Signed)
Chief Complaint  ?Patient presents with  ? Follow-up  ? ?F/u  ?1. Htn and dm 2 on lisinopril 20 mg qd, metformin xr 500 mg qd  toprol xl 50 mg qd zocor 20 mg qhs  ?Check labs today fasting  ?Callus to left foot  ?2. Sick visit 10/2021 with Dr. Tullo doing well and c/o after sick visit in 11/2021 had left neck pain now resolved and dysphagia at times with vitamin C pills  ?She is established with GI Dr. Wohl rec she call and disc with him too not due for colonoscopy until 2026  ? ?3. Anxiety due to daughter that lives in [place] is alcoholic since 66 y.o and going to see her soon her husband also had issues with etoh but better now and needs refill of valium 5 mg takes prn  ? ?Review of Systems  ?Constitutional:  Negative for weight loss.  ?HENT:  Negative for hearing loss.   ?Eyes:  Negative for blurred vision.  ?Respiratory:  Negative for shortness of breath.   ?Cardiovascular:  Negative for chest pain.  ?Gastrointestinal:  Negative for abdominal pain and blood in stool.  ?     +dysphagia ?  ?Genitourinary:  Negative for dysuria.  ?Musculoskeletal:  Negative for falls and joint pain.  ?Skin:  Negative for rash.  ?Neurological:  Negative for headaches.  ?Psychiatric/Behavioral:  Negative for depression. The patient is nervous/anxious.   ?Past Medical History:  ?Diagnosis Date  ? Allergy   ? Anxiety   ? Chicken pox   ? Collagenous colitis   ? Dr. Wohl   ? Colon polyps   ? COVID-19   ? 11/02/20 from husband pnd + with sxs  ? DDD (degenerative disc disease), cervical   ? had PT in the past, noted imaging 05/31/10   ? Depression   ? Fatty liver   ? Ganglion cyst of wrist, left   ? Hemorrhoids   ? Hemorrhoids   ? History of colon polyps   ? History of prediabetes   ? Hyperlipidemia 07/06/2015  ? Hypertension   ? IBS (irritable bowel syndrome) 07/06/2015  ? Lichen sclerosus   ? NS (neck stiffness)   ? xray shows something at "C3" approx 4 yrs ago. Had PT. Helped.  ? Polycythemia, secondary 07/06/2015  ? Prediabetes   ? SCC  (squamous cell carcinoma)   ? scalp s/p mohs 11/07/2018 [facility] Dr. Merritt  ? Urinary incontinence   ? Vitamin D deficiency 07/06/2015  ? ?Past Surgical History:  ?Procedure Laterality Date  ? BREAST BIOPSY Left 06/30/2021  ? US bx, coil marker, path pending  ? COLONOSCOPY  2006  ? COLONOSCOPY WITH PROPOFOL N/A 08/12/2015  ? Procedure: COLONOSCOPY WITH PROPOFOL;  Surgeon: Darren Wohl, MD;  Location: [facility];  Service: Endoscopy;  Laterality: N/A;  PT WOULD LIKE EARLY AM PER JS  ? EYE SURGERY    ? right eye 05/17/20 and 06/07/20 Dr. Roney my eye MD f/u 06/2020 cataract surgery narrow angles  ? HYSTEROSALPINGOGRAM    ? 1983  ? TONSILLECTOMY AND ADENOIDECTOMY    ? age 5-7  ? ?Family History  ?Problem Relation Age of Onset  ? Breast cancer Maternal Grandmother 60  ? Cancer Maternal Grandmother   ?     breast  ? Hypertension Father   ? Hyperlipidemia Father   ? COPD Father   ? Hearing loss Father   ? Heart disease Father   ? Heart attack Father   ? Diabetes Mother   ?   Hyperlipidemia Mother   ? Stroke Mother   ? Thyroid disease Daughter   ? Heart disease Maternal Grandfather   ? Heart attack Maternal Grandfather   ? Heart disease Paternal Grandfather   ? Heart attack Paternal Grandfather   ? Osteoporosis Other   ? Heart disease Paternal Grandmother   ? ?Social History  ? ?Socioeconomic History  ? Marital status: Married  ?  Spouse name: Not on file  ? Number of children: Not on file  ? Years of education: Not on file  ? Highest education level: Not on file  ?Occupational History  ? Not on file  ?Tobacco Use  ? Smoking status: Former  ?  Packs/day: 1.25  ?  Years: 42.00  ?  Pack years: 52.50  ?  Types: Cigarettes  ?  Quit date: 04/03/2014  ?  Years since quitting: 7.7  ? Smokeless tobacco: Never  ?Substance and Sexual Activity  ? Alcohol use: Yes  ?  Alcohol/week: 0.0 standard drinks  ?  Comment: wine daily  ? Drug use: No  ? Sexual activity: Not Currently  ?Other Topics Concern  ? Not on file  ?Social History Narrative   ? Married   ? Some college, retired   ? 2 daughters   ? No guns   ? Wears seat belt   ? Safe in relationship   ? ?Social Determinants of Health  ? ?Financial Resource Strain: Not on file  ?Food Insecurity: Not on file  ?Transportation Needs: Not on file  ?Physical Activity: Not on file  ?Stress: Not on file  ?Social Connections: Not on file  ?Intimate Partner Violence: Not on file  ? ?Current Meds  ?Medication Sig  ? Accu-Chek Softclix Lancets lancets Qd to bid check blood sugar e11.59  ? aspirin 81 MG tablet Take 81 mg by mouth every other day.   ? blood glucose meter kit and supplies Dispense based on patient and insurance preference. Use up to four times daily as directed. (FOR ICD-10 E10.9, E11.9).  ? Calcium Carb-Cholecalciferol 600-400 MG-UNIT TABS Calcium 600 + D(3) 600 mg-10 mcg (400 unit) tablet  ? Cholecalciferol (VITAMIN D) 2000 units CAPS Vitamin D3 2,000 unit capsule ?  1 capsule every day by oral route.  ? clobetasol ointment (TEMOVATE) 0.05 % Apply topically.  ? diphenhydrAMINE (BENADRYL) 50 MG tablet Take 50 mg by mouth at bedtime as needed for sleep.   ? ELDERBERRY PO Take by mouth.  ? glucose blood (ACCU-CHEK GUIDE) test strip Qd to bid check blood sugar e11.59  ? lisinopril (ZESTRIL) 20 MG tablet Take 1 tablet (20 mg total) by mouth daily.  ? magnesium 30 MG tablet Take 250 mg by mouth every other day. Every other week  ? metFORMIN (GLUCOPHAGE XR) 500 MG 24 hr tablet Take 1 tablet (500 mg total) by mouth daily with breakfast.  ? metoprolol succinate (TOPROL-XL) 50 MG 24 hr tablet Take 1 tablet (50 mg total) by mouth every evening.  ? Multiple Vitamin (MULTIVITAMIN) tablet Take 1 tablet by mouth every other day.   ? simvastatin (ZOCOR) 20 MG tablet Take 1 tablet (20 mg total) by mouth daily at 6 PM.  ? [DISCONTINUED] diazepam (VALIUM) 5 MG tablet Take 5 mg by mouth daily.  ? ?Allergies  ?Allergen Reactions  ? Adhesive [Tape] Other (See Comments)  ?  Severe skin breakdown;paper tape   ? Lipitor  [Atorvastatin] Swelling  ?  Swelling and leg pain  ? Tilactase Diarrhea  ? Neomycin Other (See   Comments)  ?  Nausea  ?  ? Pollen Extract Other (See Comments)  ? Levaquin [Levofloxacin In D5w] Nausea And Vomiting  ?  Lightheadedness ?  ? ?Recent Results (from the past 2160 hour(s))  ?HM DIABETES EYE EXAM     Status: None  ? Collection Time: 12/27/21 12:00 AM  ?Result Value Ref Range  ? HM Diabetic Eye Exam No Retinopathy No Retinopathy  ?  Comment: AE Dr. Thomasene Ripple 1 year f/u  ? ?Objective  ?Body mass index is 29.55 kg/m?. ?Wt Readings from Last 3 Encounters:  ?12/29/21 141 lb 6.4 oz (64.1 kg)  ?10/21/21 140 lb 4 oz (63.6 kg)  ?08/09/21 140 lb 4 oz (63.6 kg)  ? ?Temp Readings from Last 3 Encounters:  ?12/29/21 98.5 ?F (36.9 ?C) (Oral)  ?10/21/21 99 ?F (37.2 ?C)  ?05/25/21 (!) 96.5 ?F (35.8 ?C) (Temporal)  ? ?BP Readings from Last 3 Encounters:  ?12/29/21 126/80  ?08/09/21 (!) 138/100  ?05/25/21 130/84  ? ?Pulse Readings from Last 3 Encounters:  ?12/29/21 76  ?08/09/21 82  ?05/25/21 90  ? ? ?Physical Exam ?Vitals and nursing note reviewed.  ?Constitutional:   ?   Appearance: Normal appearance. She is well-developed and well-groomed.  ?HENT:  ?   Head: Normocephalic and atraumatic.  ?Eyes:  ?   Conjunctiva/sclera: Conjunctivae normal.  ?   Pupils: Pupils are equal, round, and reactive to light.  ?Cardiovascular:  ?   Rate and Rhythm: Normal rate and regular rhythm.  ?   Heart sounds: Normal heart sounds. No murmur heard. ?Pulmonary:  ?   Effort: Pulmonary effort is normal.  ?   Breath sounds: Normal breath sounds.  ?Abdominal:  ?   General: Abdomen is flat. Bowel sounds are normal.  ?   Tenderness: There is no abdominal tenderness.  ?Musculoskeletal:     ?   General: No tenderness.  ?Skin: ?   General: Skin is warm and dry.  ?Neurological:  ?   General: No focal deficit present.  ?   Mental Status: She is alert and oriented to person, place, and time. Mental status is at baseline.  ?   Cranial Nerves: Cranial nerves  2-12 are intact.  ?   Motor: Motor function is intact.  ?   Coordination: Coordination is intact.  ?   Gait: Gait is intact.  ?Psychiatric:     ?   Attention and Perception: Attention and perception normal

## 2021-12-29 NOTE — Patient Instructions (Addendum)
Prevar 20 consider this vaccine pneumonia vaccine  ? ?Dysphagia-if continues call Dr. Allen Norris ?[phone] Not available [address]  ? [place] [address]  ? ? ?Let me know if you want CT scan of neck  ? ?Dysphagia is trouble swallowing. This condition occurs when solids and liquids stick in a person's throat on the way down to the stomach, or when food takes longer to get to the stomach than usual. ?You may have problems swallowing food, liquids, or both. You may also have pain while trying to swallow. It may take you more time and effort to swallow something. ?What are the causes? ?This condition may be caused by: ?Muscle problems. These may make it difficult for you to move food and liquids through the esophagus, which is the tube that connects your mouth to your stomach. ?Blockages. You may have ulcers, scar tissue, or inflammation that blocks the normal passage of food and liquids. Causes of these problems include: ?Acid reflux from your stomach into your esophagus (gastroesophageal reflux). ?Infections. ?Radiation treatment for cancer. ?Medicines taken without enough fluids to wash them down into your stomach. ?Stroke. This can affect the nerves and make it difficult to swallow. ?Nerve problems. These prevent signals from being sent to the muscles of your esophagus to squeeze (contract) and move what you swallow down to your stomach. ?Globus pharyngeus. This is a common problem that involves a feeling like something is stuck in your throat or a sense of trouble with swallowing, even though nothing is wrong with the swallowing passages. ?Certain conditions, such as cerebral palsy or Parkinson's disease. ?What are the signs or symptoms? ?Common symptoms of this condition include: ?A feeling that solids or liquids are stuck in your throat on the way down to the stomach. ?Pain while swallowing. ?Coughing or gagging while trying to swallow. ?Other symptoms include: ?Food moving back from your  stomach to your mouth (regurgitation). ?Noises coming from your throat. ?Chest discomfort when swallowing. ?A feeling of fullness when swallowing. ?Drooling, especially when the throat is blocked. ?Heartburn. ?How is this diagnosed? ?This condition may be diagnosed by: ?Barium swallow X-ray. In this test, you will swallow a white liquid that sticks to the inside of your esophagus. X-ray images are then taken. ?Endoscopy. In this test, a flexible telescope is inserted down your throat to look at your esophagus and your stomach. ?CT scans or an MRI. ?How is this treated? ?Treatment for dysphagia depends on the cause of this condition: ?If the dysphagia is caused by acid reflux or infection, medicines may be used. These may include antibiotics or heartburn medicines. ?If the dysphagia is caused by problems with the muscles, swallowing therapy may be used to help you strengthen your swallowing muscles. You may have to do specific exercises to strengthen the muscles or stretch them. ?If the dysphagia is caused by a blockage or mass, procedures to remove the blockage may be done. You may need surgery and a feeding tube. ?You may need to make diet changes. Ask your health care provider for specific instructions. ?Follow these instructions at home: ?Medicines ?Take over-the-counter and prescription medicines only as told by your health care provider. ?If you were prescribed an antibiotic medicine, take it as told by your health care provider. Do not stop taking the antibiotic even if you start to feel better. ?Eating and drinking ? ?Make any diet changes as told by your health care provider. ?Work with a diet and nutrition specialist (dietitian) to create an eating plan that  will help you get the nutrients you need in order to stay healthy. ?Eat soft foods that are easier to swallow. ?Cut your food into small pieces and eat slowly. Take small bites. ?Eat and drink only when you are sitting upright. ?Do not drink alcohol or  caffeine. If you need help quitting, ask your health care provider. ?General instructions ?Check your weight every day to make sure you are not losing weight. ?Do not use any products that contain nicotine or tobacco. These products include cigarettes, chewing tobacco, and vaping devices, such as e-cigarettes. If you need help quitting, ask your health care provider. ?Keep all follow-up visits. This is important. ?Contact a health care provider if: ?You lose weight because you cannot swallow. ?You cough when you drink liquids. ?You cough up partially digested food. ?Get help right away if: ?You cannot swallow your saliva. ?You have shortness of breath, a fever, or both. ?Your voice is hoarse and you have trouble swallowing. ?These symptoms may represent a serious problem that is an emergency. Do not wait to see if the symptoms will go away. Get medical help right away. Call your local emergency services (911 in the [place] Do not drive yourself to the hospital. ?Summary ?Dysphagia is trouble swallowing. This condition occurs when solids and liquids stick in a person's throat on the way down to the stomach. You may cough or gag while trying to swallow. ?Dysphagia has many possible causes. ?Treatment for dysphagia depends on the cause of the condition. ?Keep all follow-up visits. This is important. ?This information is not intended to replace advice given to you by your health care provider. Make sure you discuss any questions you have with your health care provider. ?Document Revised: 05/22/2020 Document Reviewed: 05/22/2020 ?Elsevier Patient Education ? 2022 [place]. ? ? ?Pneumococcal Conjugate Vaccine (Prevnar 20) Suspension for Injection ?What is this medication? ?PNEUMOCOCCAL VACCINE (NEU mo KOK al vak SEEN) is a vaccine. It prevents pneumococcus bacterial infections. These bacteria can cause serious infections like pneumonia, meningitis, and blood infections. This vaccine will not treat an infection and will  not cause infection. This vaccine is recommended for adults 18 years and older. ?This medicine may be used for other purposes; ask your health care provider or pharmacist if you have questions. ?COMMON BRAND NAME(S): Prevnar 20 ?What should I tell my care team before I take this medication? ?They need to know if you have any of these conditions: ?bleeding disorder ?fever ?immune system problems ?an unusual or allergic reaction to pneumococcal vaccine, diphtheria toxoid, other vaccines, other medicines, foods, dyes, or preservatives ?pregnant or trying to get pregnant ?breast-feeding ?How should I use this medication? ?This vaccine is injected into a muscle. It is given by a health care provider. ?A copy of Vaccine Information Statements will be given before each vaccination. Be sure to read this information carefully each time. This sheet may change often. ?Talk to your health care provider about the use of this medicine in children. Special care may be needed. ?Overdosage: If you think you have taken too much of this medicine contact a poison control center or emergency room at once. ?NOTE: This medicine is only for you. Do not share this medicine with others. ?What if I miss a dose? ?This does not apply. This medicine is not for regular use. ?What may interact with this medication? ?medicines for cancer chemotherapy ?medicines that suppress your immune function ?steroid medicines like prednisone or cortisone ?This list may not describe all possible interactions. Give your  health care provider a list of all the medicines, herbs, non-prescription drugs, or dietary supplements you use. Also tell them if you smoke, drink alcohol, or use illegal drugs. Some items may interact with your medicine. ?What should I watch for while using this medication? ?Mild fever and pain should go away in 3 days or less. Report any unusual symptoms to your health care provider. ?What side effects may I notice from receiving this  medication? ?Side effects that you should report to your doctor or health care professional as soon as possible: ?allergic reactions (skin rash, itching or hives; swelling of the face, lips, or tongue) ?confusi

## 2021-12-30 LAB — URINALYSIS, ROUTINE W REFLEX MICROSCOPIC
Bilirubin Urine: NEGATIVE
Glucose, UA: NEGATIVE
Hgb urine dipstick: NEGATIVE
Ketones, ur: NEGATIVE
Leukocytes,Ua: NEGATIVE
Nitrite: NEGATIVE
Protein, ur: NEGATIVE
Specific Gravity, Urine: 1.008 (ref 1.001–1.035)
pH: 6 (ref 5.0–8.0)

## 2021-12-30 LAB — MICROALBUMIN / CREATININE URINE RATIO
Creatinine, Urine: 45 mg/dL (ref 20–275)
Microalb, Ur: <0.2 mg/dL

## 2022-01-02 ENCOUNTER — Other Ambulatory Visit: Payer: Self-pay | Admitting: *Deleted

## 2022-01-02 DIAGNOSIS — Z87891 Personal history of nicotine dependence: Secondary | ICD-10-CM

## 2022-01-06 ENCOUNTER — Encounter: Payer: Self-pay | Admitting: Podiatry

## 2022-01-06 ENCOUNTER — Other Ambulatory Visit: Payer: Self-pay

## 2022-01-06 ENCOUNTER — Ambulatory Visit (INDEPENDENT_AMBULATORY_CARE_PROVIDER_SITE_OTHER): Payer: Medicare Other | Admitting: Podiatry

## 2022-01-06 DIAGNOSIS — L989 Disorder of the skin and subcutaneous tissue, unspecified: Secondary | ICD-10-CM | POA: Diagnosis not present

## 2022-01-06 NOTE — Progress Notes (Signed)
? ?  Subjective: ?66 y.o. female presenting to the office today as a new patient for evaluation of pain and tenderness to the plantar aspect of the fifth MTP joint left foot.  She says that she has a callus on the area.  Her pedicure salon recommended she apply Compound W to the area.  She has been doing this for the last week or 2 and it has improved.  She presents for further treatment and evaluation ? ? ?Past Medical History:  ?Diagnosis Date  ? Allergy   ? Anxiety   ? Chicken pox   ? Collagenous colitis   ? Dr. Allen Norris   ? Colon polyps   ? COVID-19   ? 11/02/20 from husband pnd + with sxs  ? DDD (degenerative disc disease), cervical   ? had PT in the past, noted imaging 05/31/10   ? Depression   ? Fatty liver   ? Ganglion cyst of wrist, left   ? Hemorrhoids   ? Hemorrhoids   ? History of colon polyps   ? History of prediabetes   ? Hyperlipidemia 07/06/2015  ? Hypertension   ? IBS (irritable bowel syndrome) 07/06/2015  ? Lichen sclerosus   ? NS (neck stiffness)   ? xray shows something at "C3" approx 4 yrs ago. Had PT. Helped.  ? Polycythemia, secondary 07/06/2015  ? Prediabetes   ? SCC (squamous cell carcinoma)   ? scalp s/p mohs 11/07/2018 [facility] Dr. Manley Mason  ? Urinary incontinence   ? Vitamin D deficiency 07/06/2015  ? ? ? ?Objective:  ?Physical Exam ?General: Alert and oriented x3 in no acute distress ? ?Dermatology: Hyperkeratotic lesion(s) present on the plantar aspect of the fifth MTP joint left. Pain on palpation with a central nucleated core noted. Skin is warm, dry and supple bilateral lower extremities. Negative for open lesions or macerations. ? ?Vascular: Palpable pedal pulses bilaterally. No edema or erythema noted. Capillary refill within normal limits. ? ?Neurological: Epicritic and protective threshold grossly intact bilaterally.  ? ?Musculoskeletal Exam: Pain on palpation at the keratotic lesion(s) noted. Range of motion within normal limits bilateral. Muscle strength 5/5 in all groups  bilateral. ? ?Assessment: ?1.  Porokeratosis fifth MTP left ? ? ?Plan of Care:  ?1. Patient evaluated ?2. Excisional debridement of keratoic lesion(s) using a chisel blade was performed without incident.  ?3.  Continue Compound W as needed ?4.  Advised against going barefoot and recommend good supportive shoes and sneakers at all times  ?5.  Patient is to return to the clinic PRN.  ? ?Edrick Kins, DPM ?[facility] ? ?Dr. Edrick Kins, DPM  ?  ?[address]                                      ?[place], [place] [address]                ?Office [phone]  ?Fax [phone] ? ? ? ? ?

## 2022-01-13 ENCOUNTER — Ambulatory Visit
Admission: RE | Admit: 2022-01-13 | Discharge: 2022-01-13 | Disposition: A | Discharge status: Home / Self Care | Payer: Medicare Other | Source: Ambulatory Visit | Attending: Acute Care | Admitting: Acute Care

## 2022-01-13 ENCOUNTER — Other Ambulatory Visit: Payer: Self-pay

## 2022-01-13 DIAGNOSIS — Z87891 Personal history of nicotine dependence: Secondary | ICD-10-CM | POA: Diagnosis present

## 2022-01-16 ENCOUNTER — Other Ambulatory Visit: Payer: Self-pay

## 2022-01-16 DIAGNOSIS — Z87891 Personal history of nicotine dependence: Secondary | ICD-10-CM

## 2022-01-18 ENCOUNTER — Ambulatory Visit (INDEPENDENT_AMBULATORY_CARE_PROVIDER_SITE_OTHER): Payer: Medicare Other

## 2022-01-18 VITALS — Ht <= 58 in | Wt 139.0 lb

## 2022-01-18 DIAGNOSIS — Z0000 Encounter for general adult medical examination without abnormal findings: Secondary | ICD-10-CM

## 2022-01-18 NOTE — Patient Instructions (Addendum)
?  Vanessa Herman , ?Thank you for taking time to come for your Medicare Wellness Visit. I appreciate your ongoing commitment to your health goals. Please review the following plan we discussed and let me know if I can assist you in the future.  ? ?These are the goals we discussed: ? Goals   ? ?  ? Patient Stated  ?   Weigght goal 115lb (pt-stated)   ?   Increase physical activity ?Portion control  ?  ? ?  ?  ?This is a list of the screening recommended for you and due dates:  ?Health Maintenance  ?Topic Date Due  ? Zoster (Shingles) Vaccine (1 of 2) 03/31/2022*  ? Pneumonia Vaccine (1 - PCV) 12/30/2022*  ? COVID-19 Vaccine (1) 01/31/2023*  ? Flu Shot  05/16/2022  ? Hemoglobin A1C  07/01/2022  ? Eye exam for diabetics  12/28/2022  ? Complete foot exam   12/30/2022  ? Mammogram  06/15/2023  ? Colon Cancer Screening  08/11/2025  ? Tetanus Vaccine  07/30/2028  ? DEXA scan (bone density measurement)  Completed  ? Hepatitis C Screening: USPSTF Recommendation to screen - Ages 16-79 yo.  Completed  ? HPV Vaccine  Aged Out  ?*Topic was postponed. The date shown is not the original due date.  ?  ?

## 2022-01-18 NOTE — Progress Notes (Signed)
Subjective:   Vanessa Herman is a 66 y.o. female who presents for an Initial Medicare Annual Wellness Visit.  Review of Systems    No ROS.  Medicare Wellness Virtual Visit.  Visual/audio telehealth visit, UTA vital signs.   See social history for additional risk factors.   Cardiac Risk Factors include: advanced age (>59men, >13 women);diabetes mellitus;hypertension     Objective:    Today's Vitals   01/18/22 1401  Weight: 139 lb (63 kg)  Height: 4\' 10"  (1.473 m)   Body mass index is 29.05 kg/m.     01/18/2022    2:08 PM 08/12/2015    8:14 AM  Advanced Directives  Does Patient Have a Medical Advance Directive? No Yes  Type of Advance Directive  Living will  Does patient want to make changes to medical advance directive?  No - Patient declined  Copy of Healthcare Power of Attorney in Chart?  No - copy requested  Would patient like information on creating a medical advance directive? No - Patient declined     Current Medications (verified) Outpatient Encounter Medications as of 01/18/2022  Medication Sig   Accu-Chek Softclix Lancets lancets Qd to bid check blood sugar e11.59   aspirin 81 MG tablet Take 81 mg by mouth every other day.    blood glucose meter kit and supplies Dispense based on patient and insurance preference. Use up to four times daily as directed. (FOR ICD-10 E10.9, E11.9).   Calcium Carb-Cholecalciferol 600-400 MG-UNIT TABS Calcium 600 + D(3) 600 mg-10 mcg (400 unit) tablet   Cholecalciferol (VITAMIN D) 2000 units CAPS Vitamin D3 2,000 unit capsule   1 capsule every day by oral route.   clobetasol ointment (TEMOVATE) 0.05 % Apply topically.   diazepam (VALIUM) 5 MG tablet Take 1 tablet (5 mg total) by mouth daily as needed for anxiety.   diphenhydrAMINE (BENADRYL) 50 MG tablet Take 50 mg by mouth at bedtime as needed for sleep.    ELDERBERRY PO Take by mouth.   glucose blood (ACCU-CHEK GUIDE) test strip Qd to bid check blood sugar e11.59   lisinopril  (ZESTRIL) 20 MG tablet Take 1 tablet (20 mg total) by mouth daily.   magnesium 30 MG tablet Take 250 mg by mouth every other day. Every other week   metFORMIN (GLUCOPHAGE XR) 500 MG 24 hr tablet Take 1 tablet (500 mg total) by mouth daily with breakfast.   metoprolol succinate (TOPROL-XL) 50 MG 24 hr tablet Take 1 tablet (50 mg total) by mouth every evening.   Multiple Vitamin (MULTIVITAMIN) tablet Take 1 tablet by mouth every other day.    simvastatin (ZOCOR) 20 MG tablet Take 1 tablet (20 mg total) by mouth daily at 6 PM.   No facility-administered encounter medications on file as of 01/18/2022.    Allergies (verified) Adhesive [tape], Lipitor [atorvastatin], Tilactase, Neomycin, Pollen extract, and Levaquin [levofloxacin in d5w]   History: Past Medical History:  Diagnosis Date   Allergy    Anxiety    Chicken pox    Collagenous colitis    Dr. Servando Snare    Colon polyps    COVID-19    11/02/20 from husband pnd + with sxs   DDD (degenerative disc disease), cervical    had PT in the past, noted imaging 05/31/10    Depression    Fatty liver    Ganglion cyst of wrist, left    Hemorrhoids    Hemorrhoids    History of colon polyps  History of prediabetes    Hyperlipidemia 07/06/2015   Hypertension    IBS (irritable bowel syndrome) 07/06/2015   Lichen sclerosus    NS (neck stiffness)    xray shows something at "C3" approx 4 yrs ago. Had PT. Helped.   Polycythemia, secondary 07/06/2015   Prediabetes    SCC (squamous cell carcinoma)    scalp s/p mohs 11/07/2018 [facility] Dr. Lorn Junes   Urinary incontinence    Vitamin D deficiency 07/06/2015   Past Surgical History:  Procedure Laterality Date   BREAST BIOPSY Left 06/30/2021   [place] bx, coil marker, path pending   COLONOSCOPY  2006   COLONOSCOPY WITH PROPOFOL N/A 08/12/2015   Procedure: COLONOSCOPY WITH PROPOFOL;  Surgeon: Midge Minium, MD;  [facility];  Service: Endoscopy;  Laterality: N/A;  PT WOULD LIKE EARLY AM PER JS    EYE SURGERY     right eye 05/17/20 and 06/07/20 Dr. Valora Piccolo my eye MD f/u 06/2020 cataract surgery narrow angles   HYSTEROSALPINGOGRAM     1983   TONSILLECTOMY AND ADENOIDECTOMY     age 7-7   Family History  Problem Relation Age of Onset   Breast cancer Maternal Grandmother 34   Cancer Maternal Grandmother        breast   Hypertension Father    Hyperlipidemia Father    COPD Father    Hearing loss Father    Heart disease Father    Heart attack Father    Diabetes Mother    Hyperlipidemia Mother    Stroke Mother    Thyroid disease Daughter    Heart disease Maternal Grandfather    Heart attack Maternal Grandfather    Heart disease Paternal Grandfather    Heart attack Paternal Grandfather    Osteoporosis Other    Heart disease Paternal Grandmother    Social History   Socioeconomic History   Marital status: Married    Spouse name: Not on file   Number of children: Not on file   Years of education: Not on file   Highest education level: Not on file  Occupational History   Not on file  Tobacco Use   Smoking status: Former    Packs/day: 1.25    Years: 42.00    Pack years: 52.50    Types: Cigarettes    Quit date: 04/03/2014    Years since quitting: 7.8   Smokeless tobacco: Never  Substance and Sexual Activity   Alcohol use: Yes    Alcohol/week: 0.0 standard drinks    Comment: wine daily   Drug use: No   Sexual activity: Not Currently  Other Topics Concern   Not on file  Social History Narrative   Married    Some college, retired    2 daughters    No guns    Wears seat belt    Safe in relationship    Social Determinants of Corporate investment banker Strain: Low Risk    Difficulty of Paying Living Expenses: Not hard at all  Food Insecurity: No Food Insecurity   Worried About Programme researcher, broadcasting/film/video in the Last Year: Never true   Barista in the Last Year: Never true  Transportation Needs: No Transportation Needs   Lack of Transportation (Medical): No   Lack  of Transportation (Non-Medical): No  Physical Activity: Not on file  Stress: No Stress Concern Present   Feeling of Stress : Not at all  Social Connections: Unknown   Frequency of Communication  with Friends and Family: Not on file   Frequency of Social Gatherings with Friends and Family: Not on file   Attends Religious Services: Not on file   Active Member of Clubs or Organizations: Not on file   Attends Banker Meetings: Not on file   Marital Status: Married    Tobacco Counseling Counseling given: Not Answered   Clinical Intake:  Pre-visit preparation completed: Yes        Diabetes: Yes (Followed by PCP)  How often do you need to have someone help you when you read instructions, pamphlets, or other written materials from your doctor or pharmacy?: 1 - Never   Interpreter Needed?: No      Activities of Daily Living    01/18/2022    2:14 PM  In your present state of health, do you have any difficulty performing the following activities:  Hearing? 0  Vision? 0  Difficulty concentrating or making decisions? 0  Walking or climbing stairs? 0  Dressing or bathing? 0  Doing errands, shopping? 0  Preparing Food and eating ? N  Using the Toilet? N  In the past six months, have you accidently leaked urine? N  Do you have problems with loss of bowel control? N  Managing your Medications? N  Managing your Finances? N  Housekeeping or managing your Housekeeping? N    Patient Care Team: McLean-Scocuzza, Pasty Spillers, MD as PCP - General (Internal Medicine) Midge Minium, MD as Consulting Physician (Gastroenterology)  Indicate any recent Medical Services you may have received from other than [facility] providers in the past year (date may be approximate).     Assessment:   This is a routine wellness examination for Vanessa Herman.  Virtual Visit via Telephone Note  I connected with  Vanessa Herman on 01/18/22 at  2:00 PM EDT by telephone and verified that I am speaking with  the correct person using two identifiers.  Persons participating in the virtual visit: patient/Nurse Health Advisor   I discussed the limitations of performing an evaluation and management service by telehealth. The patient expressed understanding and agreed to proceed. We continued and completed visit with audio only. Some vital signs may be absent or patient reported.   Hearing/Vision screen Hearing Screening - Comments:: Patient is able to hear conversational tones without difficulty.  No issues reported. Vision Screening - Comments:: Wears glasses  Dietary issues and exercise activities discussed: Current Exercise Habits: Home exercise routine, Intensity: Mild Healthy diet Good water intake    Goals Addressed               This Visit's Progress     Patient Stated     Weigght goal 115lb (pt-stated)        Increase physical activity Portion control        Depression Screen    01/18/2022    2:05 PM 06/23/2020   11:46 AM 12/26/2019    1:39 PM 07/18/2019    2:16 PM 07/30/2018    9:28 AM 05/07/2018   11:44 AM  PHQ 2/9 Scores  PHQ - 2 Score 0 0 1 0 0 0  PHQ- 9 Score   3       Fall Risk    01/18/2022    2:12 PM 10/21/2021   10:58 AM 05/25/2021    9:26 AM 02/15/2021    2:46 PM 06/23/2020   11:46 AM  Fall Risk   Falls in the past year? 0 1 1 1  0  Number falls  in past yr:  0 0 0 0  Injury with Fall?  1 1 1  0  Risk for fall due to :  History of fall(s) History of fall(s)    Follow up Falls evaluation completed Falls evaluation completed Falls evaluation completed Falls evaluation completed Falls evaluation completed    FALL RISK PREVENTION PERTAINING TO THE HOME: Home free of loose throw rugs in walkways, pet beds, electrical cords, etc? Yes  Adequate lighting in your home to reduce risk of falls? Yes   ASSISTIVE DEVICES UTILIZED TO PREVENT FALLS: Life alert? No  Use of a cane, walker or w/c? No   TIMED UP AND GO: Was the test performed? No .   Cognitive Function:   Patient is alert and oriented x3.  Enjoys reading for brain health stimulation.    Immunizations Immunization History  Administered Date(s) Administered   Hep A / Hep B 05/21/2018, 06/25/2018, 11/21/2018   Influenza,inj,Quad PF,6+ Mos 07/12/2018, 06/23/2020   Influenza-Unspecified 07/12/2018, 07/18/2019, 06/23/2020   Tdap 07/30/2018   Screening Tests Health Maintenance  Topic Date Due   Zoster Vaccines- Shingrix (1 of 2) 03/31/2022 (Originally 08/27/1975)   Pneumonia Vaccine 11+ Years old (1 - PCV) 12/30/2022 (Originally 08/27/2020)   COVID-19 Vaccine (1) 01/31/2023 (Originally 02/25/1957)   INFLUENZA VACCINE  05/16/2022   HEMOGLOBIN A1C  07/01/2022   OPHTHALMOLOGY EXAM  12/28/2022   FOOT EXAM  12/30/2022   MAMMOGRAM  06/15/2023   COLONOSCOPY (Pts 45-44yrs Insurance coverage will need to be confirmed)  08/11/2025   TETANUS/TDAP  07/30/2028   DEXA SCAN  Completed   Hepatitis C Screening  Completed   HPV VACCINES  Aged Out   Health Maintenance There are no preventive care reminders to display for this patient.  Lung Cancer Screening: (Low Dose CT Chest recommended if Age 59-80 years, 30 pack-year currently smoking OR have quit w/in 15years.) does not qualify.   Vision Screening: Recommended annual ophthalmology exams for early detection of glaucoma and other disorders of the eye.  Dental Screening: Recommended annual dental exams for proper oral hygiene  Community Resource Referral / Chronic Care Management: CRR required this visit?  No   CCM required this visit?  No      Plan:   Keep all routine maintenance appointments.   I have personally reviewed and noted the following in the patient's chart:   Medical and social history Use of alcohol, tobacco or illicit drugs  Current medications and supplements including opioid prescriptions. Patient is not currently taking opioid prescriptions. Functional ability and status Nutritional status Physical activity Advanced  directives List of other physicians Hospitalizations, surgeries, and ER visits in previous 12 months Vitals Screenings to include cognitive, depression, and falls Referrals and appointments  In addition, I have reviewed and discussed with patient certain preventive protocols, quality metrics, and best practice recommendations. A written personalized care plan for preventive services as well as general preventive health recommendations were provided to patient.     Ashok Pall, LPN   10/21/1094

## 2022-04-24 ENCOUNTER — Encounter: Payer: Self-pay | Admitting: Internal Medicine

## 2022-04-24 ENCOUNTER — Other Ambulatory Visit: Payer: Self-pay | Admitting: Internal Medicine

## 2022-04-24 DIAGNOSIS — R7303 Prediabetes: Secondary | ICD-10-CM

## 2022-04-24 MED ORDER — METFORMIN HCL ER (MOD) 1000 MG PO TB24: 1000.0000 mg | ORAL_TABLET | Freq: Every day | ORAL | 3 refills | Status: DC

## 2022-04-25 ENCOUNTER — Other Ambulatory Visit: Payer: Self-pay | Admitting: Internal Medicine

## 2022-04-25 DIAGNOSIS — R7303 Prediabetes: Secondary | ICD-10-CM

## 2022-04-25 MED ORDER — METFORMIN HCL ER 500 MG PO TB24: 1000.0000 mg | ORAL_TABLET | Freq: Every day | ORAL | 3 refills | Status: DC

## 2022-05-19 ENCOUNTER — Other Ambulatory Visit: Payer: Self-pay | Admitting: Internal Medicine

## 2022-05-19 DIAGNOSIS — R7303 Prediabetes: Secondary | ICD-10-CM

## 2022-05-19 MED ORDER — METFORMIN HCL ER (MOD) 1000 MG PO TB24: 1000.0000 mg | ORAL_TABLET | Freq: Every day | ORAL | 3 refills | Status: DC

## 2022-06-15 ENCOUNTER — Ambulatory Visit
Admission: RE | Admit: 2022-06-15 | Discharge: 2022-06-15 | Disposition: A | Discharge status: Home / Self Care | Payer: Medicare Other | Source: Ambulatory Visit | Attending: Internal Medicine | Admitting: Internal Medicine

## 2022-06-15 ENCOUNTER — Encounter: Payer: Self-pay | Admitting: Internal Medicine

## 2022-06-15 ENCOUNTER — Telehealth: Payer: Self-pay

## 2022-06-15 ENCOUNTER — Ambulatory Visit (INDEPENDENT_AMBULATORY_CARE_PROVIDER_SITE_OTHER): Payer: Medicare Other | Admitting: Internal Medicine

## 2022-06-15 VITALS — BP 132/80 | HR 74 | Temp 99.0°F | Ht <= 58 in | Wt 144.6 lb

## 2022-06-15 DIAGNOSIS — R7303 Prediabetes: Secondary | ICD-10-CM

## 2022-06-15 DIAGNOSIS — E785 Hyperlipidemia, unspecified: Secondary | ICD-10-CM

## 2022-06-15 DIAGNOSIS — F419 Anxiety disorder, unspecified: Secondary | ICD-10-CM

## 2022-06-15 DIAGNOSIS — E1159 Type 2 diabetes mellitus with other circulatory complications: Secondary | ICD-10-CM

## 2022-06-15 DIAGNOSIS — I10 Essential (primary) hypertension: Secondary | ICD-10-CM

## 2022-06-15 DIAGNOSIS — Z1231 Encounter for screening mammogram for malignant neoplasm of breast: Secondary | ICD-10-CM | POA: Diagnosis present

## 2022-06-15 DIAGNOSIS — I152 Hypertension secondary to endocrine disorders: Secondary | ICD-10-CM | POA: Diagnosis not present

## 2022-06-15 DIAGNOSIS — E559 Vitamin D deficiency, unspecified: Secondary | ICD-10-CM

## 2022-06-15 LAB — LIPID PANEL
Cholesterol: 132 mg/dL (ref 0–200)
HDL: 59.1 mg/dL (ref >39.00)
LDL Cholesterol: 48 mg/dL (ref 0–99)
NonHDL: 72.58
Total CHOL/HDL Ratio: 2
Triglycerides: 124 mg/dL (ref 0.0–149.0)
VLDL: 24.8 mg/dL (ref 0.0–40.0)

## 2022-06-15 LAB — COMPREHENSIVE METABOLIC PANEL
ALT: 51 U/L — ABNORMAL HIGH (ref 0–35)
AST: 41 U/L — ABNORMAL HIGH (ref 0–37)
Albumin: 4.7 g/dL (ref 3.5–5.2)
Alkaline Phosphatase: 56 U/L (ref 39–117)
BUN: 10 mg/dL (ref 6–23)
CO2: 29 mEq/L (ref 19–32)
Calcium: 10 mg/dL (ref 8.4–10.5)
Chloride: 99 mEq/L (ref 96–112)
Creatinine, Ser: 0.71 mg/dL (ref 0.40–1.20)
GFR: 88.37 mL/min (ref >60.00)
Glucose, Bld: 94 mg/dL (ref 70–99)
Potassium: 4.2 mEq/L (ref 3.5–5.1)
Sodium: 138 mEq/L (ref 135–145)
Total Bilirubin: 0.8 mg/dL (ref 0.2–1.2)
Total Protein: 7.2 g/dL (ref 6.0–8.3)

## 2022-06-15 LAB — CBC WITH DIFFERENTIAL/PLATELET
Basophils Absolute: 0.1 10*3/uL (ref 0.0–0.1)
Basophils Relative: 1 % (ref 0.0–3.0)
Eosinophils Absolute: 0.1 10*3/uL (ref 0.0–0.7)
Eosinophils Relative: 2.1 % (ref 0.0–5.0)
HCT: 42.3 % (ref 36.0–46.0)
Hemoglobin: 14.3 g/dL (ref 12.0–15.0)
Lymphocytes Relative: 35.5 % (ref 12.0–46.0)
Lymphs Abs: 2.3 10*3/uL (ref 0.7–4.0)
MCHC: 33.8 g/dL (ref 30.0–36.0)
MCV: 92.7 fl (ref 78.0–100.0)
Monocytes Absolute: 0.5 10*3/uL (ref 0.1–1.0)
Monocytes Relative: 7.3 % (ref 3.0–12.0)
Neutro Abs: 3.5 10*3/uL (ref 1.4–7.7)
Neutrophils Relative %: 54.1 % (ref 43.0–77.0)
Platelets: 279 10*3/uL (ref 150.0–400.0)
RBC: 4.57 Mil/uL (ref 3.87–5.11)
RDW: 13.4 % (ref 11.5–15.5)
WBC: 6.4 10*3/uL (ref 4.0–10.5)

## 2022-06-15 LAB — VITAMIN D 25 HYDROXY (VIT D DEFICIENCY, FRACTURES): VITD: 73.61 ng/mL (ref 30.00–100.00)

## 2022-06-15 LAB — HEMOGLOBIN A1C: Hgb A1c MFr Bld: 6.8 % — ABNORMAL HIGH (ref 4.6–6.5)

## 2022-06-15 MED ORDER — METFORMIN HCL ER 500 MG PO TB24: 1000.0000 mg | ORAL_TABLET | Freq: Every day | ORAL | 3 refills | Status: DC

## 2022-06-15 MED ORDER — LISINOPRIL 20 MG PO TABS: 20.0000 mg | ORAL_TABLET | Freq: Every day | ORAL | 3 refills | Status: DC

## 2022-06-15 MED ORDER — SIMVASTATIN 20 MG PO TABS: 20.0000 mg | ORAL_TABLET | Freq: Every day | ORAL | 3 refills | Status: DC

## 2022-06-15 MED ORDER — METOPROLOL SUCCINATE ER 50 MG PO TB24: 50.0000 mg | ORAL_TABLET | Freq: Every evening | ORAL | 3 refills | Status: DC

## 2022-06-15 MED ORDER — DIAZEPAM 5 MG PO TABS: 5.0000 mg | ORAL_TABLET | Freq: Every day | ORAL | 2 refills | Status: DC | PRN

## 2022-06-15 NOTE — Progress Notes (Signed)
Chief Complaint  Patient presents with   Follow-up    6 month f/u pt has a list of things to talk about with Dr.   F/u  1. Dry eyes and burning eyes not due to see AE until 12/2022 advised call back if needed she is using eye drops [facility] for dry eyes  2. Dm 2 a1c 6.8 today with h/o HTN BP sl elevated today goal <130/<80 she is under stress 1/2 daughter alcoholic, ahdh and bipolar and lives out of town and calls her during the nighttime should be sleeping and called her last night about 11:30 pm and husband has CHF, DM and lot of health issues and she manages his health as well  She is on lis 20 mg qd, metfomin er (2) 500 mg qd, zocor 20 mg qd, toprol 50 mg qd    Review of Systems  Constitutional:  Negative for weight loss.  HENT:  Negative for hearing loss.   Eyes:  Negative for blurred vision.  Respiratory:  Negative for shortness of breath.   Cardiovascular:  Negative for chest pain.  Gastrointestinal:  Negative for abdominal pain and blood in stool.  Genitourinary:  Negative for dysuria.  Musculoskeletal:  Negative for falls and joint pain.  Skin:  Negative for rash.  Neurological:  Negative for headaches.  Psychiatric/Behavioral:  Negative for depression.    Past Medical History:  Diagnosis Date   Allergy    Anxiety    Chicken pox    Collagenous colitis    Dr. Wohl    Colon polyps    COVID-19    11/02/20 from husband pnd + with sxs   DDD (degenerative disc disease), cervical    had PT in the past, noted imaging 05/31/10    Depression    Fatty liver    Ganglion cyst of wrist, left    Hemorrhoids    Hemorrhoids    History of colon polyps    History of prediabetes    Hyperlipidemia 07/06/2015   Hypertension    IBS (irritable bowel syndrome) 07/06/2015   Lichen sclerosus    NS (neck stiffness)    xray shows something at "C3" approx 4 yrs ago. Had PT. Helped.   Polycythemia, secondary 07/06/2015   Prediabetes    SCC (squamous cell carcinoma)    scalp s/p mohs  11/07/2018 [facility] Dr. Merritt   Urinary incontinence    Vitamin D deficiency 07/06/2015   Past Surgical History:  Procedure Laterality Date   BREAST BIOPSY Left 06/30/2021   US bx, coil marker, BENIGN MAMMARY PARENCHYMA WITH MILD STROMAL FIBROSIS. - NEGATIVE FOR ATYPICAL PROLIFERATIVE   COLONOSCOPY  2006   COLONOSCOPY WITH PROPOFOL N/A 08/12/2015   Procedure: COLONOSCOPY WITH PROPOFOL;  Surgeon: Darren Wohl, MD;  Location: [facility];  Service: Endoscopy;  Laterality: N/A;  PT WOULD LIKE EARLY AM PER JS   EYE SURGERY     right eye 05/17/20 and 06/07/20 Dr. Roney my eye MD f/u 06/2020 cataract surgery narrow angles   HYSTEROSALPINGOGRAM     1983   TONSILLECTOMY AND ADENOIDECTOMY     age 5-7   Family History  Problem Relation Age of Onset   Breast cancer Maternal Grandmother 60   Cancer Maternal Grandmother        breast   Hypertension Father    Hyperlipidemia Father    COPD Father    Hearing loss Father    Heart disease Father    Heart attack Father    Diabetes   Mother    Hyperlipidemia Mother    Stroke Mother    Thyroid disease Daughter    Heart disease Maternal Grandfather    Heart attack Maternal Grandfather    Heart disease Paternal Grandfather    Heart attack Paternal Grandfather    Osteoporosis Other    Heart disease Paternal Grandmother    Social History   Socioeconomic History   Marital status: Married    Spouse name: Not on file   Number of children: Not on file   Years of education: Not on file   Highest education level: Not on file  Occupational History   Not on file  Tobacco Use   Smoking status: Former    Packs/day: 1.25    Years: 42.00    Total pack years: 52.50    Types: Cigarettes    Quit date: 04/03/2014    Years since quitting: 8.2   Smokeless tobacco: Never  Substance and Sexual Activity   Alcohol use: Yes    Alcohol/week: 0.0 standard drinks of alcohol    Comment: wine daily   Drug use: No   Sexual activity: Not Currently  Other  Topics Concern   Not on file  Social History Narrative   Married    Some college, retired    2 daughters    No guns    Wears seat belt    Safe in relationship    Social Determinants of Health   Financial Resource Strain: Low Risk  (01/18/2022)   Overall Financial Resource Strain (CARDIA)    Difficulty of Paying Living Expenses: Not hard at all  Food Insecurity: No Food Insecurity (01/18/2022)   Hunger Vital Sign    Worried About Running Out of Food in the Last Year: Never true    [place] in the Last Year: Never true  Transportation Needs: No Transportation Needs (01/18/2022)   PRAPARE - Hydrologist (Medical): No    Lack of Transportation (Non-Medical): No  Physical Activity: Not on file  Stress: No Stress Concern Present (01/18/2022)   [place]    Feeling of Stress : Not at all  Social Connections: Unknown (01/18/2022)   Social Connection and Isolation Panel [NHANES]    Frequency of Communication with Friends and Family: Not on file    Frequency of Social Gatherings with Friends and Family: Not on file    Attends Religious Services: Not on file    Active Member of Clubs or Organizations: Not on file    Attends Archivist Meetings: Not on file    Marital Status: Married  Intimate Partner Violence: Not At Risk (01/18/2022)   Humiliation, Afraid, Rape, and Kick questionnaire    Fear of Current or Ex-Partner: No    Emotionally Abused: No    Physically Abused: No    Sexually Abused: No   Current Meds  Medication Sig   Accu-Chek Softclix Lancets lancets Qd to bid check blood sugar e11.59   aspirin 81 MG tablet Take 81 mg by mouth every other day.    blood glucose meter kit and supplies Dispense based on patient and insurance preference. Use up to four times daily as directed. (FOR ICD-10 E10.9, E11.9).   Calcium Carb-Cholecalciferol 600-400 MG-UNIT TABS Calcium 600 + D(3)  600 mg-10 mcg (400 unit) tablet   Cholecalciferol (VITAMIN D) 2000 units CAPS Vitamin D3 2,000 unit capsule   1 capsule every day by oral route.  clobetasol ointment (TEMOVATE) 0.05 % Apply topically.   diphenhydrAMINE (BENADRYL) 50 MG tablet Take 50 mg by mouth at bedtime as needed for sleep.    glucose blood (ACCU-CHEK GUIDE) test strip Qd to bid check blood sugar e11.59   magnesium 30 MG tablet Take 250 mg by mouth every other day. Every other week   Multiple Vitamin (MULTIVITAMIN) tablet Take 1 tablet by mouth every other day.    [DISCONTINUED] diazepam (VALIUM) 5 MG tablet Take 1 tablet (5 mg total) by mouth daily as needed for anxiety.   [DISCONTINUED] lisinopril (ZESTRIL) 20 MG tablet Take 1 tablet (20 mg total) by mouth daily.   [DISCONTINUED] metFORMIN (GLUMETZA) 1000 MG (MOD) 24 hr tablet Take 1 tablet (1,000 mg total) by mouth daily with breakfast. Dc 500 mg   [DISCONTINUED] simvastatin (ZOCOR) 20 MG tablet Take 1 tablet (20 mg total) by mouth daily at 6 PM.   Allergies  Allergen Reactions   Adhesive [Tape] Other (See Comments)    Severe skin breakdown;paper tape    Lipitor [Atorvastatin] Swelling    Swelling and leg pain   Tilactase Diarrhea   Neomycin Other (See Comments)    Nausea     Pollen Extract Other (See Comments)   Levaquin [Levofloxacin In D5w] Nausea And Vomiting    Lightheadedness    Recent Results (from the past 2160 hour(s))  Comprehensive metabolic panel     Status: Abnormal   Collection Time: 06/15/22 10:49 AM  Result Value Ref Range   Sodium 138 135 - 145 mEq/L   Potassium 4.2 3.5 - 5.1 mEq/L   Chloride 99 96 - 112 mEq/L   CO2 29 19 - 32 mEq/L   Glucose, Bld 94 70 - 99 mg/dL   BUN 10 6 - 23 mg/dL   Creatinine, Ser 0.71 0.40 - 1.20 mg/dL   Total Bilirubin 0.8 0.2 - 1.2 mg/dL   Alkaline Phosphatase 56 39 - 117 U/L   AST 41 (H) 0 - 37 U/L   ALT 51 (H) 0 - 35 U/L   Total Protein 7.2 6.0 - 8.3 g/dL   Albumin 4.7 3.5 - 5.2 g/dL   GFR 88.37 >60.00  mL/min    Comment: Calculated using the CKD-EPI Creatinine Equation (2021)   Calcium 10.0 8.4 - 10.5 mg/dL  Lipid panel     Status: None   Collection Time: 06/15/22 10:49 AM  Result Value Ref Range   Cholesterol 132 0 - 200 mg/dL    Comment: ATP III Classification       Desirable:  < 200 mg/dL               Borderline High:  200 - 239 mg/dL          High:  > = 240 mg/dL   Triglycerides 124.0 0.0 - 149.0 mg/dL    Comment: Normal:  <150 mg/dLBorderline High:  150 - 199 mg/dL   HDL 59.10 >39.00 mg/dL   VLDL 24.8 0.0 - 40.0 mg/dL   LDL Cholesterol 48 0 - 99 mg/dL   Total CHOL/HDL Ratio 2     Comment:                Men          Women1/2 Average Risk     3.4          3.3Average Risk          5.0          4.42X Average Risk  9.6          7.13X Average Risk          15.0          11.0                       NonHDL 72.58     Comment: NOTE:  Non-HDL goal should be 30 mg/dL higher than patient's LDL goal (i.e. LDL goal of < 70 mg/dL, would have non-HDL goal of < 100 mg/dL)  CBC with Differential/Platelet     Status: None   Collection Time: 06/15/22 10:49 AM  Result Value Ref Range   WBC 6.4 4.0 - 10.5 K/uL   RBC 4.57 3.87 - 5.11 Mil/uL   Hemoglobin 14.3 12.0 - 15.0 g/dL   HCT 42.3 36.0 - 46.0 %   MCV 92.7 78.0 - 100.0 fl   MCHC 33.8 30.0 - 36.0 g/dL   RDW 13.4 11.5 - 15.5 %   Platelets 279.0 150.0 - 400.0 K/uL   Neutrophils Relative % 54.1 43.0 - 77.0 %   Lymphocytes Relative 35.5 12.0 - 46.0 %   Monocytes Relative 7.3 3.0 - 12.0 %   Eosinophils Relative 2.1 0.0 - 5.0 %   Basophils Relative 1.0 0.0 - 3.0 %   Neutro Abs 3.5 1.4 - 7.7 K/uL   Lymphs Abs 2.3 0.7 - 4.0 K/uL   Monocytes Absolute 0.5 0.1 - 1.0 K/uL   Eosinophils Absolute 0.1 0.0 - 0.7 K/uL   Basophils Absolute 0.1 0.0 - 0.1 K/uL  Hemoglobin A1c     Status: Abnormal   Collection Time: 06/15/22 10:49 AM  Result Value Ref Range   Hgb A1c MFr Bld 6.8 (H) 4.6 - 6.5 %    Comment: Glycemic Control Guidelines for People with  Diabetes:Non Diabetic:  <6%Goal of Therapy: <7%Additional Action Suggested:  >8%   Vitamin D (25 hydroxy)     Status: None   Collection Time: 06/15/22 10:49 AM  Result Value Ref Range   VITD 73.61 30.00 - 100.00 ng/mL   Objective  Body mass index is 30.22 kg/m. Wt Readings from Last 3 Encounters:  06/15/22 144 lb 9.6 oz (65.6 kg)  01/18/22 139 lb (63 kg)  12/29/21 141 lb 6.4 oz (64.1 kg)   Temp Readings from Last 3 Encounters:  06/15/22 99 F (37.2 C) (Oral)  12/29/21 98.5 F (36.9 C) (Oral)  10/21/21 99 F (37.2 C)   BP Readings from Last 3 Encounters:  06/15/22 132/80  12/29/21 126/80  08/09/21 (!) 138/100   Pulse Readings from Last 3 Encounters:  06/15/22 74  12/29/21 76  08/09/21 82    Physical Exam Vitals and nursing note reviewed.  Constitutional:      Appearance: Normal appearance. She is well-developed and well-groomed.  HENT:     Head: Normocephalic and atraumatic.  Eyes:     Conjunctiva/sclera: Conjunctivae normal.     Pupils: Pupils are equal, round, and reactive to light.  Cardiovascular:     Rate and Rhythm: Normal rate and regular rhythm.     Heart sounds: Normal heart sounds. No murmur heard. Pulmonary:     Effort: Pulmonary effort is normal.     Breath sounds: Normal breath sounds.  Abdominal:     General: Abdomen is flat. Bowel sounds are normal.     Tenderness: There is no abdominal tenderness.  Musculoskeletal:        General: No tenderness.  Skin:    General: Skin   is warm and dry.  Neurological:     General: No focal deficit present.     Mental Status: She is alert and oriented to person, place, and time. Mental status is at baseline.     Cranial Nerves: Cranial nerves 2-12 are intact.     Motor: Motor function is intact.     Coordination: Coordination is intact.     Gait: Gait is intact.  Psychiatric:        Attention and Perception: Attention and perception normal.        Mood and Affect: Mood and affect normal.        Speech:  Speech normal.        Behavior: Behavior normal. Behavior is cooperative.        Thought Content: Thought content normal.        Cognition and Memory: Cognition and memory normal.        Judgment: Judgment normal.     Assessment  Plan  Hypertension sl elevated today goal <130/<80 associated with diabetes (HCC) a1c 6.8 controlled today- Plan: Comprehensive metabolic panel, Lipid panel, CBC with Differential/Platelet, Hemoglobin A1c, Vitamin D (25 hydroxy) a1c 6.8 today with h/o HTN BP sl elevated today goal <130/<80 she is under stress 1/2 daughter alcoholic, ahdh and bipolar and lives out of town and calls her during the nighttime should be sleeping and called her last night about 11:30 pm and husband has CHF, DM and lot of health issues and she manages his health as well  She is on lis 20 mg qd, metfomin er (2) 500 mg =1000 mg qd, zocor 20 mg qd, toprol 50 mg qd    Anxiety - Plan: diazepam (VALIUM) 5 MG tablet  Hyperlipidemia, unspecified hyperlipidemia type - Plan: simvastatin (ZOCOR) 20 MG tablet   HM utd flu Tdap utd  Consider shingrix in future declines twinrix 3/3 had hep A/B immune  Consider prevnar 20 in the future   covid vaccine declines had covid 11/02/20 prevnar declines today given info today 12/21/20, declines 05/25/21    mammo 06/03/19 neg ordered neg 06/03/20 neg sch 06/14/21 left clip 06/24/21  -->sch today   DEXA 06/11/18 osteopenia repeat 06/11/21  sch 06/14/21 +osteopenia 06/14/21    Colonoscopy Wohl 08/12/15 IH and + collagenous colitis  -will rec pt f/u with GI for these medications budesonide in future though stopped taking 12/2020. Also h/o colon polyps  Due 07/2025 Dr. Wohl with dysphagia contact Dr. Wohl before 07/2025    Pap negative 07/18/19    dermatology [facility]enstein SCC scalp [address] saw 06/2019 f/u 05/2020 f/u 06/16/21   Former smoker quit 2015 total 16 years max 1.5 ppd no FH lung cancer -CT chest had 05/2018, 07/16/19 stable  07/2020    IMPRESSION: 1. Lung-RADS 2S, benign appearance or behavior. Continue annual screening with low-dose chest CT without contrast in 12 months. 2. The "S" modifier above refers to potentially clinically significant non lung cancer related findings. Specifically, there is aortic atherosclerosis, in addition to left main and 3 vessel coronary artery disease. Please note that although the presence of coronary artery calcium documents the presence of coronary artery disease, the severity of this disease and any potential stenosis cannot be assessed on this non-gated CT examination. Assessment for potential risk factor modification, dietary therapy or pharmacologic therapy may be warranted, if clinically indicated. 3. Mild diffuse bronchial wall thickening with very mild centrilobular and paraseptal emphysema; imaging findings suggestive of underlying COPD. 4. There are calcifications of   the mitral annulus. Echocardiographic correlation for evaluation of potential valvular dysfunction may be warranted if clinically indicated.   Aortic Atherosclerosis (ICD10-I70.0) and Emphysema (ICD10-J43.9).  -->  Quit smoking in 2015   Cards Dr. Rockey Situ due 07/2021    Last eye exam 2019 [facility] 01/2020 Dr. Valma Cava narrow angle had cataract surgery 05/2020-call to schedule f/u referred [facility]      rec healthy diet and exercise    Provider: Dr. Olivia Mackie McLean-Scocuzza-Internal Medicine

## 2022-06-15 NOTE — Telephone Encounter (Signed)
Patient's check-out instructions indicate she would like to transfer her care from Dr. Olivia Mackie McLean-Scocuzza to Dr. Deborra Medina.

## 2022-06-15 NOTE — Telephone Encounter (Signed)
Patient has been scheduled for a transfer of care appointment with Dr. Deborra Medina on 12/28/2022.

## 2022-06-15 NOTE — Patient Instructions (Addendum)
Consider PCP Dr.Tullo/Scott (internal Medicine) or Dr. Volanda Napoleon (family medicine)  Flu shot due high dose for >67 y.o  Prevnar 20 vaccine her or pharmacy   Think about neurology referral for burning legs you would need EMG/NCS  Think about oral surgery referral from the dentist  Dry eyes Artificial tears [facility] [employer]   F/u with eye MD   Pneumococcal Conjugate Vaccine (Prevnar 20) Suspension for Injection What is this medication? PNEUMOCOCCAL VACCINE (NEU mo KOK al vak SEEN) is a vaccine. It prevents pneumococcus bacterial infections. These bacteria can cause serious infections like pneumonia, meningitis, and blood infections. This vaccine will not treat an infection and will not cause infection. This vaccine is recommended for adults 18 years and older. This medicine may be used for other purposes; ask your health care provider or pharmacist if you have questions. COMMON BRAND NAME(S): Prevnar 20 What should I tell my care team before I take this medication? They need to know if you have any of these conditions: bleeding disorder fever immune system problems an unusual or allergic reaction to pneumococcal vaccine, diphtheria toxoid, other vaccines, other medicines, foods, dyes, or preservatives pregnant or trying to get pregnant breast-feeding How should I use this medication? This vaccine is injected into a muscle. It is given by a health care provider. A copy of Vaccine Information Statements will be given before each vaccination. Be sure to read this information carefully each time. This sheet may change often. Talk to your health care provider about the use of this medicine in children. Special care may be needed. Overdosage: If you think you have taken too much of this medicine contact a poison control center or emergency room at once. NOTE: This medicine is only for you. Do not share this medicine with others. What if I miss a dose? This does not apply. This medicine is not  for regular use. What may interact with this medication? medicines for cancer chemotherapy medicines that suppress your immune function steroid medicines like prednisone or cortisone This list may not describe all possible interactions. Give your health care provider a list of all the medicines, herbs, non-prescription drugs, or dietary supplements you use. Also tell them if you smoke, drink alcohol, or use illegal drugs. Some items may interact with your medicine. What should I watch for while using this medication? Mild fever and pain should go away in 3 days or less. Report any unusual symptoms to your health care provider. What side effects may I notice from receiving this medication? Side effects that you should report to your doctor or health care professional as soon as possible: allergic reactions (skin rash, itching or hives; swelling of the face, lips, or tongue) confusion fast, irregular heartbeat fever over 102 degrees F muscle weakness seizures trouble breathing unusual bruising or bleeding Side effects that usually do not require medical attention (report to your doctor or health care professional if they continue or are bothersome): fever of 102 degrees F or less headache joint pain muscle cramps, pain pain, tender at site where injected This list may not describe all possible side effects. Call your doctor for medical advice about side effects. You may report side effects to FDA at 1-800-FDA-1088. Where should I keep my medication? This vaccine is only given by a health care provider. It will not be stored at home. NOTE: This sheet is a summary. It may not cover all possible information. If you have questions about this medicine, talk to your doctor, pharmacist, or health care  provider.  2023 Elsevier/Gold Standard (2020-06-04 00:00:00)

## 2022-06-21 ENCOUNTER — Encounter: Payer: Self-pay | Admitting: Internal Medicine

## 2022-06-22 ENCOUNTER — Telehealth: Payer: Self-pay

## 2022-06-22 NOTE — Telephone Encounter (Signed)
LMOM for pt to Cb in regards to labs. Pt has 2 sets of msgs from provider

## 2022-06-22 NOTE — Telephone Encounter (Signed)
Patient returned Gracy Racer, CMA's call regarding lab results.  I transferred patient's call to [facility].

## 2022-08-03 NOTE — Progress Notes (Signed)
Cardiology Office Note  Date:  08/04/2022   ID:  MOHINI Herman, DOB [birth year], MRN [id number]  PCP:  McLean-Scocuzza, Nino Glow, MD   Chief Complaint  Patient presents with   12 month follow up     "Doing well." Medications reviewed by the patient verbally.     HPI:  [name] is a 66 year old woman with past medical history of  Coronary calcification on CT scan Aortic atherosclerosis Mild in the arch,  Diabetes type 2 Hypertension Obesity Stopped smoking 5 yrs ago. Presents for f/u of her palpitations and family history of coronary disease.  Aortic atherosclerosis and coronary calcification  Last seen by myself in clinic October 2022 Spend time in the garden  [employer], active, Investment banker, operational  BP at home 120 to 130/80 On metoprolol succinate 50 at night Not much palpitations  Labs reviewed  total chol 132, LDL 48 HBA1C 6.8, now on higher metformin  EKG personally reviewed by myself on todays visit Normal sinus rhythm rate 71 bpm no significant ST-T wave changes  Echo 08/2019  EF normal  CT scan chest reviewed Coronary calcifications  FH dad and both grandparents heart dz,   Prior CT scan August 2019 Showing coronary calcification, aortic atherosclerosis  Prior carotid ultrasound August 2019 Minor carotid atherosclerosis less than 50% bilaterally   PMH:   has a past medical history of Allergy, Anxiety, Chicken pox, Collagenous colitis, Colon polyps, COVID-19, DDD (degenerative disc disease), cervical, Depression, Fatty liver, Ganglion cyst of wrist, left, Hemorrhoids, Hemorrhoids, History of colon polyps, History of prediabetes, Hyperlipidemia (07/06/2015), Hypertension, IBS (irritable bowel syndrome) (40/98/1191), Lichen sclerosus, NS (neck stiffness), Polycythemia, secondary (07/06/2015), Prediabetes, SCC (squamous cell carcinoma), Urinary incontinence, and Vitamin D deficiency (07/06/2015).  PSH:    Past Surgical History:  Procedure Laterality  Date   BREAST BIOPSY Left 06/30/2021   [place] bx, coil marker, BENIGN MAMMARY PARENCHYMA WITH MILD STROMAL FIBROSIS. - NEGATIVE FOR ATYPICAL PROLIFERATIVE   COLONOSCOPY  2006   COLONOSCOPY WITH PROPOFOL N/A 08/12/2015   Procedure: COLONOSCOPY WITH PROPOFOL;  Surgeon: [name], MD;  Location: [place];  Service: Endoscopy;  Laterality: N/A;  PT WOULD LIKE EARLY AM PER JS   EYE SURGERY     right eye 05/17/20 and 06/07/20 Dr. Valma Cava my eye MD f/u 06/2020 cataract surgery narrow angles   HYSTEROSALPINGOGRAM     1983   TONSILLECTOMY AND ADENOIDECTOMY     age [age]    Current Outpatient Medications  Medication Sig Dispense Refill   Accu-Chek Softclix Lancets lancets Qd to bid check blood sugar e11.59 200 each 4   aspirin 81 MG tablet Take 81 mg by mouth every other day.      Calcium Carb-Cholecalciferol 600-400 MG-UNIT TABS Calcium 600 + D(3) 600 mg-10 mcg (400 unit) tablet     Cholecalciferol (VITAMIN D) 2000 units CAPS Vitamin D3 2,000 unit capsule   1 capsule every day by oral route.     clobetasol ointment (TEMOVATE) 0.05 % Apply topically.     diazepam (VALIUM) 5 MG tablet Take 1 tablet (5 mg total) by mouth daily as needed for anxiety. 30 tablet 2   diphenhydrAMINE (BENADRYL) 50 MG tablet Take 50 mg by mouth at bedtime as needed for sleep.      ELDERBERRY PO Take by mouth.     glucose blood (ACCU-CHEK GUIDE) test strip Qd to bid check blood sugar e11.59 200 each 4   lisinopril (ZESTRIL) 20 MG tablet Take 1 tablet (20 mg  total) by mouth daily. 90 tablet 3   magnesium 30 MG tablet Take 250 mg by mouth every other day. Every other week     metFORMIN (GLUCOPHAGE-XR) 500 MG 24 hr tablet Take 2 tablets (1,000 mg total) by mouth daily with breakfast. 180 tablet 3   metoprolol succinate (TOPROL-XL) 50 MG 24 hr tablet Take 1 tablet (50 mg total) by mouth every evening. 90 tablet 3   Multiple Vitamin (MULTIVITAMIN) tablet Take 1 tablet by mouth every other day.      simvastatin (ZOCOR) 20 MG  tablet Take 1 tablet (20 mg total) by mouth daily at 6 PM. 90 tablet 3   blood glucose meter kit and supplies Dispense based on patient and insurance preference. Use up to four times daily as directed. (FOR ICD-10 E10.9, E11.9). (Patient not taking: Reported on 08/04/2022) 1 each 0   No current facility-administered medications for this visit.     Allergies:   Adhesive [tape], Atorvastatin, Tilactase, Neomycin, Pollen extract, and Levaquin [levofloxacin in d5w]   Social History:  The patient  reports that she quit smoking about 8 years ago. Her smoking use included cigarettes. She has a 52.50 pack-year smoking history. She has never used smokeless tobacco. She reports current alcohol use. She reports that she does not use drugs.   Family History:   family history includes Breast cancer (age of onset: 19) in her maternal grandmother; COPD in her father; Cancer in her maternal grandmother; Diabetes in her mother; Hearing loss in her father; Heart attack in her father, maternal grandfather, and paternal grandfather; Heart disease in her father, maternal grandfather, paternal grandfather, and paternal grandmother; Hyperlipidemia in her father and mother; Hypertension in her father; Osteoporosis in an other family member; Stroke in her mother; Thyroid disease in her daughter.    Review of Systems: Review of Systems  Constitutional: Negative.   HENT: Negative.    Respiratory: Negative.    Cardiovascular: Negative.   Gastrointestinal: Negative.   Musculoskeletal: Negative.   Neurological: Negative.   Psychiatric/Behavioral: Negative.    All other systems reviewed and are negative.   PHYSICAL EXAM: VS:  BP 130/86 (BP Location: Left Arm, Patient Position: Sitting, Cuff Size: Normal)   Pulse 71   Ht '4\' 10"'  (1.473 m)   Wt 145 lb 6 oz (65.9 kg)   SpO2 99%   BMI 30.38 kg/m  , BMI Body mass index is 30.38 kg/m. Constitutional:  oriented to person, place, and time. No distress.  HENT:  Head:  Grossly normal Eyes:  no discharge. No scleral icterus.  Neck: No JVD, no carotid bruits  Cardiovascular: Regular rate and rhythm, no murmurs appreciated Pulmonary/Chest: Clear to auscultation bilaterally, no wheezes or rails Abdominal: Soft.  no distension.  no tenderness.  Musculoskeletal: Normal range of motion Neurological:  normal muscle tone. Coordination normal. No atrophy Skin: Skin warm and dry Psychiatric: normal affect, pleasant  Recent Labs: 12/29/2021: TSH 2.19 06/15/2022: ALT 51; BUN 10; Creatinine, Ser 0.71; Hemoglobin 14.3; Platelets 279.0; Potassium 4.2; Sodium 138    Lipid Panel Lab Results  Component Value Date   CHOL 132 06/15/2022   HDL 59.10 06/15/2022   LDLCALC 48 06/15/2022   TRIG 124.0 06/15/2022      Wt Readings from Last 3 Encounters:  08/04/22 145 lb 6 oz (65.9 kg)  06/15/22 144 lb 9.6 oz (65.6 kg)  01/18/22 139 lb (63 kg)     ASSESSMENT AND PLAN:  Aortic atherosclerosis (HCC) Mild in the arch, and coronary  calcification Stopped smoking >5 yrs ago Lipids at goal,  A1c 6.8 Recommended weight loss and walking program  Coronary artery calcification seen on CAT scan mild disease, LAD and LCX Cholesterol at goal, nonsmoker Working on diet  Essential hypertension Blood pressure is well controlled on today's visit. No changes made to the medications.  Controlled type 2 diabetes mellitus without complication, without long-term current use of insulin ([place]) We have encouraged continued exercise, careful diet management in an effort to lose weight.  Palpitations Controlled with metoprolol  Nonspecific chest pain No chest pain sx   Total encounter time more than 30 minutes  Greater than 50% was spent in counseling and coordination of care with the patient   Orders Placed This Encounter  Procedures   EKG 12-Lead     Signed, Esmond Plants, M.D., Ph.D. 08/04/2022  [place], [place]

## 2022-08-04 ENCOUNTER — Encounter: Payer: Self-pay | Admitting: Cardiovascular Disease

## 2022-08-04 ENCOUNTER — Ambulatory Visit: Payer: Medicare Other | Attending: Cardiovascular Disease | Admitting: Cardiovascular Disease

## 2022-08-04 VITALS — BP 130/86 | HR 71 | Ht <= 58 in | Wt 145.4 lb

## 2022-08-04 DIAGNOSIS — I2510 Atherosclerotic heart disease of native coronary artery without angina pectoris: Secondary | ICD-10-CM | POA: Insufficient documentation

## 2022-08-04 DIAGNOSIS — I700 Atherosclerosis of aorta: Secondary | ICD-10-CM | POA: Diagnosis not present

## 2022-08-04 DIAGNOSIS — E782 Mixed hyperlipidemia: Secondary | ICD-10-CM | POA: Insufficient documentation

## 2022-08-04 DIAGNOSIS — E119 Type 2 diabetes mellitus without complications: Secondary | ICD-10-CM

## 2022-08-04 DIAGNOSIS — I10 Essential (primary) hypertension: Secondary | ICD-10-CM | POA: Insufficient documentation

## 2022-08-04 NOTE — Patient Instructions (Addendum)
Medication Instructions:  No changes  If you need a refill on your cardiac medications before your next appointment, please call your pharmacy.   Lab work: No new labs needed  Testing/Procedures: No new testing needed  Follow-Up: At [facility], you and your health needs are our priority.  As part of our continuing mission to provide you with exceptional heart care, we have created designated Provider Care Teams.  These Care Teams include your primary Cardiologist (physician) and Advanced Practice Providers (APPs -  Physician Assistants and Nurse Practitioners) who all work together to provide you with the care you need, when you need it.  You will need a follow up appointment in 12 months  Providers on your designated Care Team:   Christopher Berge, NP Ryan Dunn, PA-C Cadence Furth, PA-C  COVID-19 Vaccine Information can be found at: [email or web address] For questions related to vaccine distribution or appointments, please email [email or web address] or call [phone].   

## 2022-08-23 ENCOUNTER — Telehealth: Payer: Self-pay

## 2022-08-23 DIAGNOSIS — R194 Change in bowel habit: Secondary | ICD-10-CM

## 2022-08-23 DIAGNOSIS — R195 Other fecal abnormalities: Secondary | ICD-10-CM

## 2022-08-23 NOTE — Telephone Encounter (Signed)
Patient asked me during her husband's triage call to convey the following to Dr.Wohl - She has had change in bowel consistency sometimes its been small, and narrowing and sometimes it floats. She said in September in experienced clay colored stools but not now.  No abdominal pain or discomfort present.  Her last colonoscopy was in 2016 hemorrhoids were noted.  She wondered if she should have a repeat colonoscopy or schedule an office visit.    Please advise.  Thanks,  [employer]

## 2022-08-24 ENCOUNTER — Other Ambulatory Visit: Payer: Self-pay

## 2022-08-24 DIAGNOSIS — R195 Other fecal abnormalities: Secondary | ICD-10-CM

## 2022-08-24 DIAGNOSIS — R194 Change in bowel habit: Secondary | ICD-10-CM

## 2022-08-24 MED ORDER — NA SULFATE-K SULFATE-MG SULF 17.5-3.13-1.6 GM/177ML PO SOLN: 1.0000 | Freq: Once | ORAL | 0 refills | Status: AC

## 2022-08-24 NOTE — Telephone Encounter (Signed)
Patient has been advised per Dr. Allen Norris with a change of bowel habits and shape she should be set up for colonoscopy and can forgo an office visit.  Patient is in agreement with this and has been scheduled for her colonoscopy at [facility] on Friday 09/15/22.  She has been advised to stop Metformin on 09/13/22.  Hold all meds the morning of her procedure.  Pt verbalized understanding.  Instructions will be mailed and sent to patient via mychart.  Thanks,  [employer]

## 2022-08-24 NOTE — Addendum Note (Signed)
Addended by: Vanetta Mulders on: 08/24/2022 08:42 AM   Modules accepted: Orders

## 2022-09-11 ENCOUNTER — Other Ambulatory Visit: Payer: Self-pay

## 2022-09-11 ENCOUNTER — Encounter: Payer: Self-pay | Admitting: Gastroenterology

## 2022-09-13 NOTE — Anesthesia Preprocedure Evaluation (Addendum)
Anesthesia Evaluation  Patient identified by MRN, date of birth, ID band Patient awake    Reviewed: Allergy & Precautions, NPO status , Patient's Chart, lab work & pertinent test results, reviewed documented beta blocker date and time   Airway Mallampati: III  TM Distance: >3 FB Neck ROM: full    Dental no notable dental hx.    Pulmonary former smoker   Pulmonary exam normal        Cardiovascular Exercise Tolerance: Good hypertension, + CAD (seen on ct scan)  Normal cardiovascular exam+ dysrhythmias (palpitations)      Neuro/Psych Vertigo  negative psych ROS   GI/Hepatic Fatty Liver Collagenous colitis   Endo/Other  diabetes, Type 2    Renal/GU negative Renal ROS  negative genitourinary   Musculoskeletal  (+) Arthritis ,    Abdominal   Peds  Hematology negative hematology ROS (+)   Anesthesia Other Findings Past Medical History: No date: Allergy No date: Anxiety No date: Chicken pox No date: Collagenous colitis     Comment:  Dr. Allen Norris  No date: Colon polyps No date: COVID-19     Comment:  11/02/20 from husband pnd + with sxs No date: DDD (degenerative disc disease), cervical     Comment:  had PT in the past, noted imaging 05/31/10  No date: Depression No date: Diabetes mellitus without complication ([place])     Comment:  Type 2 No date: Fatty liver No date: Ganglion cyst of wrist, left No date: Hemorrhoids No date: Hemorrhoids No date: History of colon polyps No date: History of prediabetes 07/06/2015: Hyperlipidemia No date: Hypertension 07/06/2015: IBS (irritable bowel syndrome) No date: Lichen sclerosus No date: NS (neck stiffness)     Comment:  xray shows something at "C3" approx 4 yrs ago. Had PT.               Helped. 07/06/2015: Polycythemia, secondary No date: Prediabetes No date: SCC (squamous cell carcinoma)     Comment:  scalp s/p mohs 11/07/2018 [facility] Dr. Manley Mason No date: Urinary  incontinence No date: Vertigo     Comment:  occasional 07/06/2015: Vitamin D deficiency  Past Surgical History: 06/30/2021: BREAST BIOPSY; Left     Comment:  [place] bx, coil marker, BENIGN MAMMARY PARENCHYMA WITH MILD               STROMAL FIBROSIS. - NEGATIVE FOR ATYPICAL PROLIFERATIVE 2006: COLONOSCOPY 08/12/2015: COLONOSCOPY WITH PROPOFOL; N/A     Comment:  Procedure: COLONOSCOPY WITH PROPOFOL;  Surgeon: [name], MD;  Location: [place];  Service:               Endoscopy;  Laterality: N/A;  PT WOULD LIKE EARLY AM PER               JS No date: EYE SURGERY     Comment:  right eye 05/17/20 and 06/07/20 Dr. Valma Cava my eye MD f/u               06/2020 cataract surgery narrow angles No date: HYSTEROSALPINGOGRAM     Comment:  1983 No date: TONSILLECTOMY AND ADENOIDECTOMY     Comment:  age [age]  BMI    Body Mass Index: 29.68 kg/m      Reproductive/Obstetrics negative OB ROS  Anesthesia Physical Anesthesia Plan  ASA: 2  Anesthesia Plan: General   Post-op Pain Management: Minimal or no pain anticipated   Induction: Intravenous  PONV Risk Score and Plan: Propofol infusion and TIVA  Airway Management Planned: Natural Airway  Additional Equipment:   Intra-op Plan:   Post-operative Plan:   Informed Consent: I have reviewed the patients History and Physical, chart, labs and discussed the procedure including the risks, benefits and alternatives for the proposed anesthesia with the patient or authorized representative who has indicated his/her understanding and acceptance.     Dental Advisory Given  Plan Discussed with: Anesthesiologist, CRNA and Surgeon  Anesthesia Plan Comments:         Anesthesia Quick Evaluation

## 2022-09-15 ENCOUNTER — Ambulatory Visit: Payer: Medicare Other | Admitting: Anesthesiology

## 2022-09-15 ENCOUNTER — Encounter
Admission: RE | Disposition: A | Discharge status: Home / Self Care | Payer: Self-pay | Source: Home / Self Care | Attending: Gastroenterology

## 2022-09-15 ENCOUNTER — Ambulatory Visit
Admission: RE | Admit: 2022-09-15 | Discharge: 2022-09-15 | Disposition: A | Discharge status: Home / Self Care | Payer: Medicare Other | Attending: Gastroenterology | Admitting: Gastroenterology

## 2022-09-15 ENCOUNTER — Encounter: Payer: Self-pay | Admitting: Gastroenterology

## 2022-09-15 ENCOUNTER — Other Ambulatory Visit: Payer: Self-pay

## 2022-09-15 DIAGNOSIS — K589 Irritable bowel syndrome without diarrhea: Secondary | ICD-10-CM | POA: Insufficient documentation

## 2022-09-15 DIAGNOSIS — Z85828 Personal history of other malignant neoplasm of skin: Secondary | ICD-10-CM | POA: Insufficient documentation

## 2022-09-15 DIAGNOSIS — Z86010 Personal history of colonic polyps: Secondary | ICD-10-CM | POA: Insufficient documentation

## 2022-09-15 DIAGNOSIS — E785 Hyperlipidemia, unspecified: Secondary | ICD-10-CM | POA: Diagnosis not present

## 2022-09-15 DIAGNOSIS — Z8616 Personal history of COVID-19: Secondary | ICD-10-CM | POA: Insufficient documentation

## 2022-09-15 DIAGNOSIS — I10 Essential (primary) hypertension: Secondary | ICD-10-CM | POA: Diagnosis not present

## 2022-09-15 DIAGNOSIS — D751 Secondary polycythemia: Secondary | ICD-10-CM | POA: Insufficient documentation

## 2022-09-15 DIAGNOSIS — R195 Other fecal abnormalities: Secondary | ICD-10-CM | POA: Diagnosis present

## 2022-09-15 DIAGNOSIS — F419 Anxiety disorder, unspecified: Secondary | ICD-10-CM | POA: Insufficient documentation

## 2022-09-15 DIAGNOSIS — Z7984 Long term (current) use of oral hypoglycemic drugs: Secondary | ICD-10-CM | POA: Insufficient documentation

## 2022-09-15 DIAGNOSIS — K760 Fatty (change of) liver, not elsewhere classified: Secondary | ICD-10-CM | POA: Insufficient documentation

## 2022-09-15 DIAGNOSIS — E559 Vitamin D deficiency, unspecified: Secondary | ICD-10-CM | POA: Insufficient documentation

## 2022-09-15 DIAGNOSIS — R194 Change in bowel habit: Secondary | ICD-10-CM | POA: Diagnosis not present

## 2022-09-15 DIAGNOSIS — E119 Type 2 diabetes mellitus without complications: Secondary | ICD-10-CM | POA: Insufficient documentation

## 2022-09-15 DIAGNOSIS — I2510 Atherosclerotic heart disease of native coronary artery without angina pectoris: Secondary | ICD-10-CM | POA: Insufficient documentation

## 2022-09-15 DIAGNOSIS — Z87891 Personal history of nicotine dependence: Secondary | ICD-10-CM | POA: Insufficient documentation

## 2022-09-15 HISTORY — DX: Type 2 diabetes mellitus without complications: E11.9

## 2022-09-15 HISTORY — PX: COLONOSCOPY WITH PROPOFOL: SHX5780

## 2022-09-15 HISTORY — DX: Dizziness and giddiness: R42

## 2022-09-15 LAB — GLUCOSE, CAPILLARY: Glucose-Capillary: 125 mg/dL — ABNORMAL HIGH (ref 70–99)

## 2022-09-15 SURGERY — COLONOSCOPY WITH PROPOFOL
Anesthesia: General | Site: Rectum

## 2022-09-15 MED ORDER — PROPOFOL 10 MG/ML IV BOLUS
INTRAVENOUS | Status: DC | PRN
  Administered 2022-09-15: 30 mg via INTRAVENOUS
  Administered 2022-09-15: 50 mg via INTRAVENOUS
  Administered 2022-09-15: 30 mg via INTRAVENOUS
  Administered 2022-09-15: 20 mg via INTRAVENOUS
  Administered 2022-09-15: 30 mg via INTRAVENOUS
  Administered 2022-09-15: 50 mg via INTRAVENOUS
  Administered 2022-09-15: 20 mg via INTRAVENOUS

## 2022-09-15 MED ORDER — LIDOCAINE HCL (CARDIAC) PF 100 MG/5ML IV SOSY
PREFILLED_SYRINGE | INTRAVENOUS | Status: DC | PRN
  Administered 2022-09-15: 40 mg via INTRAVENOUS

## 2022-09-15 MED ORDER — SODIUM CHLORIDE 0.9 % IV SOLN: INTRAVENOUS | Status: DC

## 2022-09-15 MED ORDER — LACTATED RINGERS IV SOLN: INTRAVENOUS | Status: DC

## 2022-09-15 MED ORDER — STERILE WATER FOR IRRIGATION IR SOLN
Status: DC | PRN
  Administered 2022-09-15: 150 mL

## 2022-09-15 SURGICAL SUPPLY — 21 items

## 2022-09-15 NOTE — Transfer of Care (Signed)
Immediate Anesthesia Transfer of Care Note  Patient: Vanessa Herman  Procedure(s) Performed: COLONOSCOPY WITH PROPOFOL (Rectum)  Patient Location: PACU  Anesthesia Type: General  Level of Consciousness: awake, alert  and patient cooperative  Airway and Oxygen Therapy: Patient Spontanous Breathing and Patient connected to supplemental oxygen  Post-op Assessment: Post-op Vital signs reviewed, Patient's Cardiovascular Status Stable, Respiratory Function Stable, Patent Airway and No signs of Nausea or vomiting  Post-op Vital Signs: Reviewed and stable  Complications: No notable events documented.

## 2022-09-15 NOTE — H&P (Signed)
Vanessa Lame, MD [address] [place], [place] [address] Phone:[phone] Fax : [phone]  Primary Care Physician:  McLean-Scocuzza, Nino Glow, MD Primary Gastroenterologist:  Dr. Allen Norris  Pre-Procedure History & Physical: HPI:  Vanessa Herman is a 66 y.o. female is here for an colonoscopy.   Past Medical History:  Diagnosis Date   Allergy    Anxiety    Chicken pox    Collagenous colitis    Dr. Allen Norris    Colon polyps    COVID-19    11/02/20 from husband pnd + with sxs   DDD (degenerative disc disease), cervical    had PT in the past, noted imaging 05/31/10    Depression    Diabetes mellitus without complication ([place])    Type 2   Fatty liver    Ganglion cyst of wrist, left    Hemorrhoids    Hemorrhoids    History of colon polyps    History of prediabetes    Hyperlipidemia 07/06/2015   Hypertension    IBS (irritable bowel syndrome) 21/22/4825   Lichen sclerosus    NS (neck stiffness)    xray shows something at "C3" approx 4 yrs ago. Had PT. Helped.   Polycythemia, secondary 07/06/2015   Prediabetes    SCC (squamous cell carcinoma)    scalp s/p mohs 11/07/2018 [facility] Dr. Manley Mason   Urinary incontinence    Vertigo    occasional   Vitamin D deficiency 07/06/2015    Past Surgical History:  Procedure Laterality Date   BREAST BIOPSY Left 06/30/2021   [place] bx, coil marker, BENIGN MAMMARY PARENCHYMA WITH MILD STROMAL FIBROSIS. - NEGATIVE FOR ATYPICAL PROLIFERATIVE   COLONOSCOPY  2006   COLONOSCOPY WITH PROPOFOL N/A 08/12/2015   Procedure: COLONOSCOPY WITH PROPOFOL;  Surgeon: Vanessa Lame, MD;  Location: [place];  Service: Endoscopy;  Laterality: N/A;  PT WOULD LIKE EARLY AM PER JS   EYE SURGERY     right eye 05/17/20 and 06/07/20 Dr. Valma Cava my eye MD f/u 06/2020 cataract surgery narrow angles   HYSTEROSALPINGOGRAM     1983   TONSILLECTOMY AND ADENOIDECTOMY     age [age]    Prior to Admission medications   Medication Sig Start Date End Date Taking?  Authorizing Provider  aspirin 81 MG tablet Take 81 mg by mouth every other day.    Yes [provider]  Calcium Carb-Cholecalciferol 600-400 MG-UNIT TABS Calcium 600 + D(3) 600 mg-10 mcg (400 unit) tablet 10/16/17  Yes [provider]  Cholecalciferol (VITAMIN D) 2000 units CAPS Vitamin D3 2,000 unit capsule   1 capsule every day by oral route.   Yes [provider]  clobetasol ointment (TEMOVATE) 0.05 % Apply topically. 10/03/21  Yes [provider]  diazepam (VALIUM) 5 MG tablet Take 1 tablet (5 mg total) by mouth daily as needed for anxiety. 06/15/22  Yes McLean-Scocuzza, Nino Glow, MD  diphenhydrAMINE (BENADRYL) 50 MG tablet Take 50 mg by mouth at bedtime as needed for sleep.    Yes [provider]  ELDERBERRY PO Take by mouth.   Yes [provider]  glucose blood (ACCU-CHEK GUIDE) test strip Qd to bid check blood sugar e11.59 10/19/21  Yes McLean-Scocuzza, Nino Glow, MD  lisinopril (ZESTRIL) 20 MG tablet Take 1 tablet (20 mg total) by mouth daily. 06/15/22  Yes McLean-Scocuzza, Nino Glow, MD  magnesium 30 MG tablet Take 250 mg by mouth every other day. Every other week   Yes [provider]  metFORMIN (GLUCOPHAGE-XR) 500 MG 24 hr tablet Take 2 tablets (1,000 mg total) by mouth daily with breakfast. 06/15/22  Yes McLean-Scocuzza, Nino Glow, MD  Multiple Vitamin (MULTIVITAMIN) tablet Take 1 tablet by mouth every other day.    Yes [provider]  simvastatin (ZOCOR) 20 MG tablet Take 1 tablet (20 mg total) by mouth daily at 6 PM. 06/15/22  Yes McLean-Scocuzza, Nino Glow, MD  Accu-Chek Softclix Lancets lancets Qd to bid check blood sugar e11.59 10/19/21   McLean-Scocuzza, Nino Glow, MD  blood glucose meter kit and supplies Dispense based on patient and insurance preference. Use up to four times daily as directed. (FOR ICD-10 E10.9, E11.9). Patient not taking: Reported on 08/04/2022 09/06/20   McLean-Scocuzza, Nino Glow, MD  metoprolol succinate  (TOPROL-XL) 50 MG 24 hr tablet Take 1 tablet (50 mg total) by mouth every evening. 06/15/22 09/13/22  McLean-Scocuzza, Nino Glow, MD    Allergies as of 08/24/2022 - Review Complete 08/04/2022  Allergen Reaction Noted   Adhesive [tape] Other (See Comments) 09/01/2019   Atorvastatin Swelling and Other (See Comments) 06/08/2015   Tilactase Diarrhea 05/07/2018   Neomycin Other (See Comments) 05/07/2018   Pollen extract Other (See Comments) 05/07/2018   Levaquin [levofloxacin in d5w] Nausea And Vomiting 07/06/2015    Family History  Problem Relation Age of Onset   Breast cancer Maternal Grandmother 60   Cancer Maternal Grandmother        breast   Hypertension Father    Hyperlipidemia Father    COPD Father    Hearing loss Father    Heart disease Father    Heart attack Father    Diabetes Mother    Hyperlipidemia Mother    Stroke Mother    Thyroid disease Daughter    Heart disease Maternal Grandfather    Heart attack Maternal Grandfather    Heart disease Paternal Grandfather    Heart attack Paternal Grandfather    Osteoporosis Other    Heart disease Paternal Grandmother     Social History   Socioeconomic History   Marital status: Married    Spouse name: Not on file   Number of children: Not on file   Years of education: Not on file   Highest education level: Not on file  Occupational History   Not on file  Tobacco Use   Smoking status: Former    Packs/day: 1.25    Years: 42.00    Total pack years: 52.50    Types: Cigarettes    Quit date: 04/03/2014    Years since quitting: 8.4   Smokeless tobacco: Never  Vaping Use   Vaping Use: Never used  Substance and Sexual Activity   Alcohol use: Yes    Comment: occasionally   Drug use: No   Sexual activity: Not Currently  Other Topics Concern   Not on file  Social History Narrative   Married    Some college, retired    2 daughters    No guns    Wears seat belt    Safe in relationship    Social Determinants of Health    Financial Resource Strain: [place]  (01/18/2022)   Overall Financial Resource Strain (CARDIA)    Difficulty of Paying Living Expenses: Not hard at all  Food Insecurity: No Food Insecurity (01/18/2022)   Hunger Vital Sign    Worried About Running Out of Food in the Last Year: Never true    Ran Out of Food in the Last Year: Never true  Transportation Needs: No Transportation Needs (01/18/2022)   PRAPARE - Hydrologist (Medical): No    Lack of Transportation (Non-Medical): No  Physical Activity: Not on file  Stress: No Stress Concern Present (01/18/2022)   [place]    Feeling of Stress : Not at all  Social Connections: Unknown (01/18/2022)   Social Connection and Isolation Panel [NHANES]    Frequency of Communication with Friends and Family: Not on file    Frequency of Social Gatherings with Friends and Family: Not on file    Attends Religious Services: Not on file    Active Member of Clubs or Organizations: Not on file    Attends Archivist Meetings: Not on file    Marital Status: Married  Intimate Partner Violence: Not At Risk (01/18/2022)   Humiliation, Afraid, Rape, and Kick questionnaire    Fear of Current or Ex-Partner: No    Emotionally Abused: No    Physically Abused: No    Sexually Abused: No    Review of Systems: See HPI, otherwise negative ROS  Physical Exam: BP (!) 145/63   Pulse 92   Temp 98.2 F (36.8 C) (Temporal)   Resp 20   Ht _0  (1.473 m)   Wt 63.5 kg   SpO2 98%   BMI 29.26 kg/m  General:   Alert,  pleasant and cooperative in NAD Head:  Normocephalic and atraumatic. Neck:  Supple; no masses or thyromegaly. Lungs:  Clear throughout to auscultation.    Heart:  Regular rate and rhythm. Abdomen:  Soft, nontender and nondistended. Normal bowel sounds, without guarding, and without rebound.   Neurologic:  Alert and  oriented x4;  grossly normal  neurologically.  Impression/Plan: Vanessa Herman is here for an colonoscopy to be performed for change in bowel habits  Risks, benefits, limitations, and alternatives regarding  colonoscopy have been reviewed with the patient.  Questions have been answered.  All parties agreeable.   Vanessa Lame, MD  09/15/2022, 8:20 AM

## 2022-09-15 NOTE — Anesthesia Postprocedure Evaluation (Signed)
Anesthesia Post Note  Patient: Vanessa Herman  Procedure(s) Performed: COLONOSCOPY WITH PROPOFOL (Rectum)  Patient location during evaluation: PACU Anesthesia Type: General Level of consciousness: awake and alert Pain management: pain level controlled Vital Signs Assessment: post-procedure vital signs reviewed and stable Respiratory status: spontaneous breathing, nonlabored ventilation and respiratory function stable Cardiovascular status: blood pressure returned to baseline and stable Postop Assessment: no apparent nausea or vomiting Anesthetic complications: no   No notable events documented.   Last Vitals:  Vitals:   09/15/22 0855 09/15/22 0906  BP: (!) 124/49 128/63  Pulse: 92 90  Resp: 18 19  Temp: (!) 36.4 C   SpO2: 100% 99%    Last Pain:  Vitals:   09/15/22 0906  TempSrc:   PainSc: 0-No pain                 [place] Ouch

## 2022-09-15 NOTE — Op Note (Signed)
[facility] Patient Name: Vanessa Herman Procedure Date: 09/15/2022 8:21 AM MRN: [id number] Account #: [id number] Date of Birth: 06/19/1956 Admit Type: Outpatient Age: 66 Room: [facility] OR ROOM 01 Gender: Female Note Status: Finalized Instrument Name: [id number] Procedure:             Colonoscopy Indications:           Change in stool caliber, Incidental change in bowel                         habits noted Providers:             [name] MD, MD Referring MD:          Nino Glow Mclean-Scocuzza MD, MD (Referring MD) Medicines:             Propofol per Anesthesia Complications:         No immediate complications. Procedure:             Pre-Anesthesia Assessment:                        - Prior to the procedure, a History and Physical was                         performed, and patient medications and allergies were                         reviewed. The patient's tolerance of previous                         anesthesia was also reviewed. The risks and benefits                         of the procedure and the sedation options and risks                         were discussed with the patient. All questions were                         answered, and informed consent was obtained. Prior                         Anticoagulants: The patient has taken no anticoagulant                         or antiplatelet agents. ASA Grade Assessment: II - A                         patient with mild systemic disease. After reviewing                         the risks and benefits, the patient was deemed in                         satisfactory condition to undergo the procedure.                        After obtaining informed consent, the colonoscope was  passed under direct vision. Throughout the procedure,                         the patient's blood pressure, pulse, and oxygen                         saturations were monitored continuously. The                          Colonoscope was introduced through the anus and                         advanced to the the cecum, identified by appendiceal                         orifice and ileocecal valve. The colonoscopy was                         performed without difficulty. The patient tolerated                         the procedure well. The quality of the bowel                         preparation was excellent. Findings:      The perianal and digital rectal examinations were normal.      The colon (entire examined portion) appeared normal. Impression:            - The entire examined colon is normal.                        - No specimens collected. Recommendation:        - Discharge patient to home.                        - Resume previous diet.                        - Continue present medications.                        - Repeat colonoscopy in 10 years for screening                         purposes. Procedure Code(s):     --- Professional ---                        [phone], Colonoscopy, flexible; diagnostic, including                         collection of specimen(s) by brushing or washing, when                         performed (separate procedure) Diagnosis Code(s):     --- Professional ---                        R19.5, Other fecal abnormalities CPT copyright 2022 American Medical Association. All rights reserved. The codes documented in this report are preliminary and upon coder review may  be revised to meet current compliance requirements. [name] MD, MD 09/15/2022 8:53:37 AM This report has been signed electronically. Number of Addenda: 0 Note Initiated On: 09/15/2022 8:21 AM Scope Withdrawal Time: 0 hours 6 minutes 24 seconds  Total Procedure Duration: 0 hours 16 minutes 59 seconds  Estimated Blood Loss:  Estimated blood loss: none.      [facility]

## 2022-09-18 ENCOUNTER — Encounter: Payer: Self-pay | Admitting: Gastroenterology

## 2022-10-24 ENCOUNTER — Other Ambulatory Visit: Payer: Self-pay

## 2022-10-24 ENCOUNTER — Telehealth: Payer: Self-pay | Admitting: Internal Medicine

## 2022-10-24 DIAGNOSIS — R7303 Prediabetes: Secondary | ICD-10-CM

## 2022-10-24 MED ORDER — ACCU-CHEK GUIDE VI STRP: ORAL_STRIP | 4 refills | Status: DC

## 2022-10-24 MED ORDER — ACCU-CHEK SOFTCLIX LANCETS MISC: 4 refills | Status: DC

## 2022-10-24 NOTE — Telephone Encounter (Signed)
Refills sent

## 2022-10-24 NOTE — Telephone Encounter (Signed)
Pt called stating she need a refill on lancets and test strips sent to [facility] in [place]

## 2022-12-12 LAB — HM DIABETES EYE EXAM

## 2022-12-28 ENCOUNTER — Ambulatory Visit (INDEPENDENT_AMBULATORY_CARE_PROVIDER_SITE_OTHER): Payer: Medicare Other | Admitting: Internal Medicine

## 2022-12-28 ENCOUNTER — Encounter: Payer: Self-pay | Admitting: Internal Medicine

## 2022-12-28 VITALS — BP 146/82 | HR 89 | Temp 98.2°F | Ht <= 58 in | Wt 141.4 lb

## 2022-12-28 DIAGNOSIS — E119 Type 2 diabetes mellitus without complications: Secondary | ICD-10-CM

## 2022-12-28 DIAGNOSIS — I2510 Atherosclerotic heart disease of native coronary artery without angina pectoris: Secondary | ICD-10-CM

## 2022-12-28 DIAGNOSIS — R5383 Other fatigue: Secondary | ICD-10-CM | POA: Diagnosis not present

## 2022-12-28 DIAGNOSIS — R7303 Prediabetes: Secondary | ICD-10-CM

## 2022-12-28 DIAGNOSIS — I152 Hypertension secondary to endocrine disorders: Secondary | ICD-10-CM

## 2022-12-28 DIAGNOSIS — I10 Essential (primary) hypertension: Secondary | ICD-10-CM

## 2022-12-28 DIAGNOSIS — E1159 Type 2 diabetes mellitus with other circulatory complications: Secondary | ICD-10-CM

## 2022-12-28 DIAGNOSIS — N950 Postmenopausal bleeding: Secondary | ICD-10-CM

## 2022-12-28 DIAGNOSIS — E782 Mixed hyperlipidemia: Secondary | ICD-10-CM

## 2022-12-28 DIAGNOSIS — K52831 Collagenous colitis: Secondary | ICD-10-CM

## 2022-12-28 DIAGNOSIS — K760 Fatty (change of) liver, not elsewhere classified: Secondary | ICD-10-CM

## 2022-12-28 DIAGNOSIS — M810 Age-related osteoporosis without current pathological fracture: Secondary | ICD-10-CM

## 2022-12-28 DIAGNOSIS — I700 Atherosclerosis of aorta: Secondary | ICD-10-CM

## 2022-12-28 LAB — TSH: TSH: 1.55 u[IU]/mL (ref 0.35–5.50)

## 2022-12-28 LAB — COMPREHENSIVE METABOLIC PANEL
ALT: 64 U/L — ABNORMAL HIGH (ref 0–35)
AST: 51 U/L — ABNORMAL HIGH (ref 0–37)
Albumin: 4.8 g/dL (ref 3.5–5.2)
Alkaline Phosphatase: 55 U/L (ref 39–117)
BUN: 10 mg/dL (ref 6–23)
CO2: 25 mEq/L (ref 19–32)
Calcium: 10.2 mg/dL (ref 8.4–10.5)
Chloride: 99 mEq/L (ref 96–112)
Creatinine, Ser: 0.78 mg/dL (ref 0.40–1.20)
GFR: 78.64 mL/min (ref >60.00)
Glucose, Bld: 103 mg/dL — ABNORMAL HIGH (ref 70–99)
Potassium: 4.6 mEq/L (ref 3.5–5.1)
Sodium: 136 mEq/L (ref 135–145)
Total Bilirubin: 0.8 mg/dL (ref 0.2–1.2)
Total Protein: 7.2 g/dL (ref 6.0–8.3)

## 2022-12-28 LAB — MICROALBUMIN / CREATININE URINE RATIO
Creatinine,U: 25.9 mg/dL
Microalb Creat Ratio: 2.7 mg/g (ref 0.0–30.0)
Microalb, Ur: <0.7 mg/dL (ref 0.0–1.9)

## 2022-12-28 LAB — CBC WITH DIFFERENTIAL/PLATELET
Basophils Absolute: 0.1 K/uL (ref 0.0–0.1)
Basophils Relative: 1 % (ref 0.0–3.0)
Eosinophils Absolute: 0.2 K/uL (ref 0.0–0.7)
Eosinophils Relative: 2.2 % (ref 0.0–5.0)
HCT: 44.5 % (ref 36.0–46.0)
Hemoglobin: 15 g/dL (ref 12.0–15.0)
Lymphocytes Relative: 32.3 % (ref 12.0–46.0)
Lymphs Abs: 2.2 K/uL (ref 0.7–4.0)
MCHC: 33.7 g/dL (ref 30.0–36.0)
MCV: 91.2 fl (ref 78.0–100.0)
Monocytes Absolute: 0.5 K/uL (ref 0.1–1.0)
Monocytes Relative: 7.3 % (ref 3.0–12.0)
Neutro Abs: 3.9 K/uL (ref 1.4–7.7)
Neutrophils Relative %: 57.2 % (ref 43.0–77.0)
Platelets: 302 K/uL (ref 150.0–400.0)
RBC: 4.88 Mil/uL (ref 3.87–5.11)
RDW: 13.9 % (ref 11.5–15.5)
WBC: 6.9 K/uL (ref 4.0–10.5)

## 2022-12-28 LAB — POCT GLYCOSYLATED HEMOGLOBIN (HGB A1C): Hemoglobin A1C: 6.2 % — AB (ref 4.0–5.6)

## 2022-12-28 LAB — LIPID PANEL
Cholesterol: 116 mg/dL (ref 0–200)
HDL: 51.3 mg/dL (ref >39.00)
LDL Cholesterol: 38 mg/dL (ref 0–99)
NonHDL: 64.36
Total CHOL/HDL Ratio: 2
Triglycerides: 130 mg/dL (ref 0.0–149.0)
VLDL: 26 mg/dL (ref 0.0–40.0)

## 2022-12-28 LAB — LDL CHOLESTEROL, DIRECT: Direct LDL: 52 mg/dL

## 2022-12-28 MED ORDER — METFORMIN HCL 500 MG PO TABS: 500.0000 mg | ORAL_TABLET | Freq: Two times a day (BID) | ORAL | 2 refills | Status: DC

## 2022-12-28 NOTE — Assessment & Plan Note (Signed)
Continue annual follow up with Dr Leafy Ro

## 2022-12-28 NOTE — Patient Instructions (Addendum)
Consider changing your simvastatin to every other day Rosuvastatin   Continue metformin as you are doing .  YOur diabetes is under EXCELLENT control  Your foot deformity is going to cause chronic problems over the 5th metatarsal heads .  I will review your last podiatry evaluation to see why it wasn't addressed    The new "standard normal"  for  blood pressure is now 130/80 or less .  Please check your blood pressure a few times at home and send me the readings so I can determine if you need to start a medication to lower your blood pressure .

## 2022-12-28 NOTE — Progress Notes (Signed)
Subjective:  Patient ID: Vanessa Herman, female    DOB: Nov 16, 1955  Age: 67 y.o. MRN: [id number]  CC: The primary encounter diagnosis was Essential hypertension. Diagnoses of Mixed hyperlipidemia, Prediabetes, Other fatigue, Postmenopausal bleeding, Type 2 diabetes mellitus without complication, without long-term current use of insulin ([place]), Hypertension associated with diabetes ([place]), Collagenous colitis, Coronary artery calcification seen on CAT scan, Aortic atherosclerosis ([place]), Osteoporosis without current pathological fracture, unspecified osteoporosis type, and Fatty liver were also pertinent to this visit.   HPIt MARYKATE HASKINS presents for TOC.  Referred by daughter Monte Fantasia  Chief Complaint  Patient presents with   Transfer of Care   1) Type 2 DM/obesity /hypertension :  last A1c 6.8 in August 2023.  Currently  managed only with metformin XR 1000 mg daily .  sugars have been elevated lately,  has added 500 mg of IR metformin twice daily in early February.   A) Obesity :  reviewed  history of weight gain  . No  weight problem until 2015 when husband's health became poor due to dementia and complications of diabetes.  Her weight gain started  in 2015 with increased appetite and decreased activity level, but she has recently noticed an improved feeling well being  and smaller appetite with the addition of IR metformin , and she denies diarrhea  . Also taking simvastatin and lisinopril. Still eating one Lil Debbie  cookie or  popcorn at night.   Motivated to lose weight   2) h/o tobacco abuse:  annual   CT chest is due for lung CA screen  3) aortic atherosclerosis and coronary calcifications noted on last CT:  discussed change to high potency statin, but she has a history of atorvastatin intolerance due to myalgias. Wants to reduce simvastatin to every other day due to hair loss.   4) recent episode of acute sinusitis/bronchitis:  resolved with abx and prednisone  5) h/o  collagenous colitis in 2015.  Still gets diarrhea with salads and other certain foods.  Was treated with budesonide for 5 years.    Lab Results  Component Value Date   CHOL 116 12/28/2022   HDL 51.30 12/28/2022   LDLCALC 38 12/28/2022   LDLDIRECT 52.0 12/28/2022   TRIG 130.0 12/28/2022   CHOLHDL 2 12/28/2022        Outpatient Medications Prior to Visit  Medication Sig Dispense Refill   Accu-Chek Softclix Lancets lancets Qd to bid check blood sugar e11.59 200 each 4   aspirin 81 MG tablet Take 81 mg by mouth every other day.      blood glucose meter kit and supplies Dispense based on patient and insurance preference. Use up to four times daily as directed. (FOR ICD-10 E10.9, E11.9). 1 each 0   Calcium Carb-Cholecalciferol 600-400 MG-UNIT TABS Calcium 600 + D(3) 600 mg-10 mcg (400 unit) tablet     Cholecalciferol (VITAMIN D) 2000 units CAPS Vitamin D3 2,000 unit capsule   1 capsule every day by oral route.     clobetasol ointment (TEMOVATE) 0.05 % Apply topically.     diazepam (VALIUM) 5 MG tablet Take 1 tablet (5 mg total) by mouth daily as needed for anxiety. 30 tablet 2   diphenhydrAMINE (BENADRYL) 50 MG tablet Take 50 mg by mouth at bedtime as needed for sleep.      glucose blood (ACCU-CHEK GUIDE) test strip Qd to bid check blood sugar e11.59 200 each 4   lisinopril (ZESTRIL) 20 MG tablet Take 1 tablet (20  mg total) by mouth daily. 90 tablet 3   metFORMIN (GLUCOPHAGE-XR) 500 MG 24 hr tablet Take 2 tablets (1,000 mg total) by mouth daily with breakfast. 180 tablet 3   metoprolol succinate (TOPROL-XL) 50 MG 24 hr tablet Take 1 tablet (50 mg total) by mouth every evening. 90 tablet 3   Multiple Vitamin (MULTIVITAMIN) tablet Take 1 tablet by mouth every other day.      simvastatin (ZOCOR) 20 MG tablet Take 1 tablet (20 mg total) by mouth daily at 6 PM. 90 tablet 3   metFORMIN (GLUCOPHAGE) 500 MG tablet Take 500 mg by mouth 2 (two) times daily with a meal.     ELDERBERRY PO Take by  mouth.     magnesium 30 MG tablet Take 250 mg by mouth every other day. Every other week     No facility-administered medications prior to visit.    Review of Systems;  Patient denies headache, fevers, malaise, unintentional weight loss, skin rash, eye pain, sinus congestion and sinus pain, sore throat, dysphagia,  hemoptysis , cough, dyspnea, wheezing, chest pain, palpitations, orthopnea, edema, abdominal pain, nausea, melena, diarrhea, constipation, flank pain, dysuria, hematuria, urinary  Frequency, nocturia, numbness, tingling, seizures,  Focal weakness, Loss of consciousness,  Tremor, insomnia, depression, anxiety, and suicidal ideation.      Objective:  BP (!) 146/82   Pulse 89   Temp 98.2 F (36.8 C) (Oral)   Ht 4\' 10"  (1.473 m)   Wt 141 lb 6.4 oz (64.1 kg)   SpO2 97%   BMI 29.55 kg/m   BP Readings from Last 3 Encounters:  12/28/22 (!) 146/82  09/15/22 128/63  08/04/22 130/86    Wt Readings from Last 3 Encounters:  12/28/22 141 lb 6.4 oz (64.1 kg)  09/15/22 140 lb (63.5 kg)  08/04/22 145 lb 6 oz (65.9 kg)    Physical Exam Vitals reviewed.  Constitutional:      General: She is not in acute distress.    Appearance: Normal appearance. She is normal weight. She is not ill-appearing, toxic-appearing or diaphoretic.  HENT:     Head: Normocephalic.  Eyes:     General: No scleral icterus.       Right eye: No discharge.        Left eye: No discharge.     Conjunctiva/sclera: Conjunctivae normal.  Cardiovascular:     Rate and Rhythm: Normal rate and regular rhythm.     Heart sounds: Normal heart sounds.  Pulmonary:     Effort: Pulmonary effort is normal. No respiratory distress.     Breath sounds: Normal breath sounds.  Musculoskeletal:        General: Normal range of motion.  Skin:    General: Skin is warm and dry.  Neurological:     General: No focal deficit present.     Mental Status: She is alert and oriented to person, place, and time. Mental status is at  baseline.  Psychiatric:        Mood and Affect: Mood normal.        Behavior: Behavior normal.        Thought Content: Thought content normal.        Judgment: Judgment normal.     Lab Results  Component Value Date   HGBA1C 6.2 (A) 12/28/2022   HGBA1C 6.8 (H) 06/15/2022   HGBA1C 6.8 (H) 12/29/2021    Lab Results  Component Value Date   CREATININE 0.78 12/28/2022   CREATININE 0.71 06/15/2022   CREATININE  0.80 12/29/2021    Lab Results  Component Value Date   WBC 6.9 12/28/2022   HGB 15.0 12/28/2022   HCT 44.5 12/28/2022   PLT 302.0 12/28/2022   GLUCOSE 103 (H) 12/28/2022   CHOL 116 12/28/2022   TRIG 130.0 12/28/2022   HDL 51.30 12/28/2022   LDLDIRECT 52.0 12/28/2022   LDLCALC 38 12/28/2022   ALT 64 (H) 12/28/2022   AST 51 (H) 12/28/2022   NA 136 12/28/2022   K 4.6 12/28/2022   CL 99 12/28/2022   CREATININE 0.78 12/28/2022   BUN 10 12/28/2022   CO2 25 12/28/2022   TSH 1.55 12/28/2022   HGBA1C 6.2 (A) 12/28/2022   MICROALBUR <0.7 12/28/2022    No results found.  Assessment & Plan:  .Essential hypertension -     Microalbumin / creatinine urine ratio -     Comprehensive metabolic panel  Mixed hyperlipidemia -     Lipid panel -     LDL cholesterol, direct  Prediabetes Assessment & Plan:  well-controlled on metformin .  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   She is taking a medium potency statin and an ACE inhibitor.  Lab Results  Component Value Date   HGBA1C 6.2 (A) 12/28/2022   Lab Results  Component Value Date   LABMICR Comment 05/07/2018   MICROALBUR <0.7 12/28/2022   MICROALBUR <0.2 12/29/2021      Lab Results  Component Value Date   CHOL 116 12/28/2022   HDL 51.30 12/28/2022   LDLCALC 38 12/28/2022    LDLDIRECT 52.0 12/28/2022   TRIG 130.0 12/28/2022   CHOLHDL 2 12/28/2022      Orders: -     Microalbumin / creatinine urine ratio -     Comprehensive metabolic panel -     POCT glycosylated hemoglobin (Hb A1C)  Other fatigue -     TSH -     CBC with Differential/Platelet  Postmenopausal bleeding Assessment & Plan: Continue annual follow up with Dr Leafy Ro    Type 2 diabetes mellitus without complication, without long-term current use of insulin ([place]) Assessment & Plan:  well-controlled on metformin .  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   She is taking a medium potency statin and an ACE inhibitor.  Lab Results  Component Value Date   HGBA1C 6.2 (A) 12/28/2022   Lab Results  Component Value Date   LABMICR Comment 05/07/2018   MICROALBUR <0.7 12/28/2022   MICROALBUR <0.2 12/29/2021      Lab Results  Component Value Date   CHOL 116 12/28/2022   HDL 51.30 12/28/2022   LDLCALC 38 12/28/2022   LDLDIRECT 52.0 12/28/2022   TRIG 130.0 12/28/2022   CHOLHDL 2 12/28/2022       Hypertension associated with diabetes ([place]) Assessment & Plan: she reports compliance with medication regimen  but has an elevated reading today in office.  She is not using NSAIDs daily.  Discussed goal of 120/70  (130/80 for patients over 70)  to preserve renal function.  She has been asked to check her  BP  at home and  submit readings for evaluation. Renal function, electrolytes and screen for proteinuria are all normal .  Lab Results  Component Value Date   CREATININE 0.78 12/28/2022   Lab Results  Component Value Date   NA 136 12/28/2022   K 4.6 12/28/2022   CL 99 12/28/2022   CO2 25 12/28/2022   Lab Results  Component Value Date   LABMICR Comment  05/07/2018   MICROALBUR <0.7 12/28/2022   MICROALBUR <0.2 12/29/2021        Collagenous colitis Assessment & Plan: Treated with budesonide for 5 years.  Currently resolved.   Coronary artery calcification seen on CAT scan Assessment & Plan: Continue beta blocker, asa and statin    Aortic atherosclerosis ([place]) Assessment & Plan: Reviewed findings with patient . She has been advised that high potency statins are recommended; I have recommended a trial of rosuvastatin bt she prefers to continue simvastatin .   Osteoporosis without current pathological fracture, unspecified osteoporosis type Assessment & Plan: Presumed secondary to use of budesonide, taken for 5 years for management of collagenous ciolitis.  Her BMD has improved since 2017 and is in the osteopenic range by 2022 DEXA     Fatty liver Assessment & Plan: Moderate to marked by CT Abdomen in 2021 .  Current liver enzymes are mildly elevated;  given her current use of metformin and statin,  will recommend referral to GI   Lab Results  Component Value Date   ALT 64 (H) 12/28/2022   AST 51 (H) 12/28/2022   ALKPHOS 55 12/28/2022   BILITOT 0.8 12/28/2022      Other orders -     metFORMIN HCl; Take 1 tablet (500 mg total) by mouth 2 (two) times daily with a meal.  Dispense: 180 tablet; Refill: 2     I provided 32 minutes of face-to-face time during this encounter reviewing patient's last visit with her previous PCP,  her  most recent visit with cardiology,   recent surgical and non surgical procedures, previous  labs and imaging studies, counseling on currently addressed issues,  and post visit ordering to diagnostics and therapeutics .   Follow-up: Return in about 6 months (around 06/30/2023).   Crecencio Mc, MD

## 2022-12-30 NOTE — Assessment & Plan Note (Addendum)
Presumed secondary to use of budesonide, taken for 5 years for management of collagenous ciolitis.  Her BMD has improved since 2017 and is in the osteopenic range by 2022 DEXA

## 2022-12-30 NOTE — Assessment & Plan Note (Signed)
Treated with budesonide for 5 years.  Currently resolved.

## 2022-12-30 NOTE — Assessment & Plan Note (Signed)
she reports compliance with medication regimen  but has an elevated reading today in office.  She is not using NSAIDs daily.  Discussed goal of 120/70  (130/80 for patients over 70)  to preserve renal function.  She has been asked to check her  BP  at home and  submit readings for evaluation. Renal function, electrolytes and screen for proteinuria are all normal .  Lab Results  Component Value Date   CREATININE 0.78 12/28/2022   Lab Results  Component Value Date   NA 136 12/28/2022   K 4.6 12/28/2022   CL 99 12/28/2022   CO2 25 12/28/2022   Lab Results  Component Value Date   LABMICR Comment 05/07/2018   MICROALBUR <0.7 12/28/2022   MICROALBUR <0.2 12/29/2021

## 2022-12-30 NOTE — Assessment & Plan Note (Signed)
Reviewed findings with patient . She has been advised that high potency statins are recommended; I have recommended a trial of rosuvastatin bt she prefers to continue simvastatin .

## 2022-12-30 NOTE — Assessment & Plan Note (Signed)
Continue beta blocker, asa and statin

## 2022-12-30 NOTE — Assessment & Plan Note (Addendum)
well-controlled on metformin .  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   She is taking a medium potency statin and an ACE inhibitor.  Lab Results  Component Value Date   HGBA1C 6.2 (A) 12/28/2022   Lab Results  Component Value Date   LABMICR Comment 05/07/2018   MICROALBUR <0.7 12/28/2022   MICROALBUR <0.2 12/29/2021      Lab Results  Component Value Date   CHOL 116 12/28/2022   HDL 51.30 12/28/2022   LDLCALC 38 12/28/2022   LDLDIRECT 52.0 12/28/2022   TRIG 130.0 12/28/2022   CHOLHDL 2 12/28/2022

## 2022-12-30 NOTE — Assessment & Plan Note (Addendum)
Moderate to marked by CT Abdomen in 2021 .  Current liver enzymes are mildly elevated;  given her current use of metformin and statin,  will recommend referral to GI   Lab Results  Component Value Date   ALT 64 (H) 12/28/2022   AST 51 (H) 12/28/2022   ALKPHOS 55 12/28/2022   BILITOT 0.8 12/28/2022

## 2023-01-04 ENCOUNTER — Telehealth: Payer: Self-pay

## 2023-01-04 DIAGNOSIS — K760 Fatty (change of) liver, not elsewhere classified: Secondary | ICD-10-CM

## 2023-01-04 NOTE — Telephone Encounter (Signed)
Patient states that she is returning our call.  Patient states Dr. Deborra Medina asked patient if she would like to be referred to Dr. [name] and patient states she would like to be referred to him.

## 2023-01-04 NOTE — Telephone Encounter (Signed)
I have pended referral for approval. Per last office note with Dr. Derrel Nip looks like pt is being referred for fatty liver and her liver enzymes are mildly elevated.

## 2023-01-11 ENCOUNTER — Ambulatory Visit
Admission: RE | Admit: 2023-01-11 | Discharge: 2023-01-11 | Disposition: A | Discharge status: Home / Self Care | Payer: Medicare Other | Source: Ambulatory Visit | Attending: Acute Care | Admitting: Acute Care

## 2023-01-11 DIAGNOSIS — Z87891 Personal history of nicotine dependence: Secondary | ICD-10-CM | POA: Insufficient documentation

## 2023-01-12 ENCOUNTER — Other Ambulatory Visit: Payer: Self-pay | Admitting: Acute Care

## 2023-01-12 DIAGNOSIS — Z122 Encounter for screening for malignant neoplasm of respiratory organs: Secondary | ICD-10-CM

## 2023-01-12 DIAGNOSIS — Z87891 Personal history of nicotine dependence: Secondary | ICD-10-CM

## 2023-01-13 ENCOUNTER — Encounter: Payer: Self-pay | Admitting: Internal Medicine

## 2023-01-16 ENCOUNTER — Ambulatory Visit: Payer: Medicare Other

## 2023-01-16 ENCOUNTER — Other Ambulatory Visit: Payer: Self-pay

## 2023-01-16 DIAGNOSIS — R7303 Prediabetes: Secondary | ICD-10-CM

## 2023-01-16 MED ORDER — ACCU-CHEK GUIDE VI STRP: ORAL_STRIP | 4 refills | Status: DC

## 2023-01-16 MED ORDER — ACCU-CHEK SOFTCLIX LANCETS MISC: 4 refills | Status: DC

## 2023-01-17 ENCOUNTER — Other Ambulatory Visit: Payer: Self-pay | Admitting: Internal Medicine

## 2023-01-17 DIAGNOSIS — E782 Mixed hyperlipidemia: Secondary | ICD-10-CM

## 2023-01-17 DIAGNOSIS — K760 Fatty (change of) liver, not elsewhere classified: Secondary | ICD-10-CM

## 2023-01-17 MED ORDER — ATORVASTATIN CALCIUM 20 MG PO TABS: 20.0000 mg | ORAL_TABLET | Freq: Every day | ORAL | 3 refills | Status: DC

## 2023-01-17 MED ORDER — ROSUVASTATIN CALCIUM 20 MG PO TABS: 20.0000 mg | ORAL_TABLET | Freq: Every day | ORAL | 0 refills | Status: DC

## 2023-03-01 ENCOUNTER — Telehealth: Payer: Self-pay | Admitting: Internal Medicine

## 2023-03-01 NOTE — Telephone Encounter (Signed)
Contacted Tsuneko Zhan Lear to schedule their annual wellness visit. Appointment made for 04/10/2023.  Verlee Rossetti; Care Guide Ambulatory Clinical Support [facility] Direct Dial: [phone]

## 2023-04-11 ENCOUNTER — Ambulatory Visit (INDEPENDENT_AMBULATORY_CARE_PROVIDER_SITE_OTHER): Payer: Medicare Other | Admitting: *Deleted

## 2023-04-11 VITALS — Ht <= 58 in | Wt 139.0 lb

## 2023-04-11 DIAGNOSIS — Z1231 Encounter for screening mammogram for malignant neoplasm of breast: Secondary | ICD-10-CM | POA: Diagnosis not present

## 2023-04-11 DIAGNOSIS — Z0000 Encounter for general adult medical examination without abnormal findings: Secondary | ICD-10-CM | POA: Diagnosis not present

## 2023-04-11 DIAGNOSIS — Q782 Osteopetrosis: Secondary | ICD-10-CM

## 2023-04-11 DIAGNOSIS — M810 Age-related osteoporosis without current pathological fracture: Secondary | ICD-10-CM

## 2023-04-11 NOTE — Progress Notes (Cosign Needed)
Subjective:   Vanessa Herman is a 67 y.o. female who presents for Medicare Annual (Subsequent) preventive examination.  Visit Complete: Virtual  I connected with  Vanessa Herman on 04/11/23 by a audio enabled telemedicine application and verified that I am speaking with the correct person using two identifiers.  Patient Location: Home  Provider Location: Office/Clinic  I discussed the limitations of evaluation and management by telemedicine. The patient expressed understanding and agreed to proceed.    Review of Systems           Objective:    Today's Vitals   04/11/23 1014  Weight: 139 lb (63 kg)  Height: 4\' 10"  (1.473 m)   Body mass index is 29.05 kg/m.     04/11/2023   10:43 AM 01/18/2022    2:08 PM 08/12/2015    8:14 AM  Advanced Directives  Does Patient Have a Medical Advance Directive? No No Yes  Type of Advance Directive   Living will  Does patient want to make changes to medical advance directive?   No - Patient declined  Copy of Healthcare Power of Attorney in Chart?   No - copy requested  Would patient like information on creating a medical advance directive?  No - Patient declined     Current Medications (verified) Outpatient Encounter Medications as of 04/11/2023  Medication Sig   Accu-Chek Softclix Lancets lancets Use to check blood sugare once daily.  e11.59   aspirin 81 MG tablet Take 81 mg by mouth every other day.    blood glucose meter kit and supplies Dispense based on patient and insurance preference. Use up to four times daily as directed. (FOR ICD-10 E10.9, E11.9).   Calcium Carb-Cholecalciferol 600-400 MG-UNIT TABS Calcium 600 + D(3) 600 mg-10 mcg (400 unit) tablet   Cholecalciferol (VITAMIN D) 2000 units CAPS Vitamin D3 2,000 unit capsule   1 capsule every day by oral route.   clobetasol ointment (TEMOVATE) 0.05 % Apply topically.   diazepam (VALIUM) 5 MG tablet Take 1 tablet (5 mg total) by mouth daily as needed for anxiety.    diphenhydrAMINE (BENADRYL) 50 MG tablet Take 50 mg by mouth at bedtime as needed for sleep.    glucose blood (ACCU-CHEK GUIDE) test strip Use to check blood sugar once daily.  e11.59   lisinopril (ZESTRIL) 20 MG tablet Take 1 tablet (20 mg total) by mouth daily.   metFORMIN (GLUCOPHAGE) 500 MG tablet Take 1 tablet (500 mg total) by mouth 2 (two) times daily with a meal.   metFORMIN (GLUCOPHAGE-XR) 500 MG 24 hr tablet Take 2 tablets (1,000 mg total) by mouth daily with breakfast.   metoprolol succinate (TOPROL-XL) 50 MG 24 hr tablet Take 1 tablet (50 mg total) by mouth every evening.   Multiple Vitamin (MULTIVITAMIN) tablet Take 1 tablet by mouth every other day.    simvastatin (ZOCOR) 20 MG tablet Take 20 mg by mouth daily.   rosuvastatin (CRESTOR) 20 MG tablet Take 1 tablet (20 mg total) by mouth daily. (Patient not taking: Reported on 04/11/2023)   No facility-administered encounter medications on file as of 04/11/2023.    Allergies (verified) Adhesive [tape], Atorvastatin, Tilactase, Neomycin, Pollen extract, Crestor [rosuvastatin], and Levaquin [levofloxacin in d5w]   History: Past Medical History:  Diagnosis Date   Allergy    Anxiety    Chicken pox    Collagenous colitis    Dr. Servando Snare    Colon polyps    COVID-19    11/02/20 from husband  pnd + with sxs   DDD (degenerative disc disease), cervical    had PT in the past, noted imaging 05/31/10    Depression    Diabetes mellitus without complication (HCC)    Type 2   Fatty liver    Ganglion cyst of wrist, left    Hemorrhoids    Hemorrhoids    History of colon polyps    History of prediabetes    Hyperlipidemia 07/06/2015   Hypertension    IBS (irritable bowel syndrome) 07/06/2015   Lichen sclerosus    NS (neck stiffness)    xray shows something at "C3" approx 4 yrs ago. Had PT. Helped.   Polycythemia, secondary 07/06/2015   Prediabetes    SCC (squamous cell carcinoma)    scalp s/p mohs 11/07/2018 [facility] Dr. Lorn Junes   Urinary  incontinence    Vertigo    occasional   Vitamin D deficiency 07/06/2015   Past Surgical History:  Procedure Laterality Date   BREAST BIOPSY Left 06/30/2021   [place] bx, coil marker, BENIGN MAMMARY PARENCHYMA WITH MILD STROMAL FIBROSIS. - NEGATIVE FOR ATYPICAL PROLIFERATIVE   COLONOSCOPY  2006   COLONOSCOPY WITH PROPOFOL N/A 08/12/2015   Procedure: COLONOSCOPY WITH PROPOFOL;  Surgeon: Midge Minium, MD;  Location: [facility];  Service: Endoscopy;  Laterality: N/A;  PT WOULD LIKE EARLY AM PER JS   COLONOSCOPY WITH PROPOFOL N/A 09/15/2022   Procedure: COLONOSCOPY WITH PROPOFOL;  Surgeon: Midge Minium, MD;  Location: [facility];  Service: Endoscopy;  Laterality: N/A;   EYE SURGERY     right eye 05/17/20 and 06/07/20 Dr. Valora Piccolo my eye MD f/u 06/2020 cataract surgery narrow angles   HYSTEROSALPINGOGRAM     1983   TONSILLECTOMY AND ADENOIDECTOMY     age [age]   Family History  Problem Relation Age of Onset   Breast cancer Maternal Grandmother 58   Cancer Maternal Grandmother        breast   Hypertension Father    Hyperlipidemia Father    COPD Father    Hearing loss Father    Heart disease Father    Heart attack Father    Diabetes Mother    Hyperlipidemia Mother    Stroke Mother    Thyroid disease Daughter    Heart disease Maternal Grandfather    Heart attack Maternal Grandfather    Heart disease Paternal Grandfather    Heart attack Paternal Grandfather    Osteoporosis Other    Heart disease Paternal Grandmother    Social History   Socioeconomic History   Marital status: Married    Spouse name: Not on file   Number of children: Not on file   Years of education: Not on file   Highest education level: Not on file  Occupational History   Not on file  Tobacco Use   Smoking status: Former    Packs/day: 1.25    Years: 42.00    Additional pack years: 0.00    Total pack years: 52.50    Types: Cigarettes    Quit date: 04/03/2014    Years since quitting: 9.0   Smokeless  tobacco: Never  Vaping Use   Vaping Use: Never used  Substance and Sexual Activity   Alcohol use: Yes    Comment: occasionally   Drug use: No   Sexual activity: Not Currently  Other Topics Concern   Not on file  Social History Narrative   Married    Some college, retired    2 daughters ,  one has bipolar disorder    No guns    Wears seat belt    Safe in relationship    Social Determinants of Health   Financial Resource Strain: Low Risk  (04/11/2023)   Overall Financial Resource Strain (CARDIA)    Difficulty of Paying Living Expenses: Not hard at all  Food Insecurity: No Food Insecurity (04/11/2023)   Hunger Vital Sign    Worried About Running Out of Food in the Last Year: Never true    Ran Out of Food in the Last Year: Never true  Transportation Needs: No Transportation Needs (04/11/2023)   PRAPARE - Administrator, Civil Service (Medical): No    Lack of Transportation (Non-Medical): No  Physical Activity: Inactive (04/11/2023)   Exercise Vital Sign    Days of Exercise per Week: 0 days    Minutes of Exercise per Session: 0 min  Stress: Stress Concern Present (04/11/2023)   [employer] of Occupational Health - Occupational Stress Questionnaire    Feeling of Stress : To some extent  Social Connections: Moderately Isolated (04/11/2023)   Social Connection and Isolation Panel [NHANES]    Frequency of Communication with Friends and Family: More than three times a week    Frequency of Social Gatherings with Friends and Family: More than three times a week    Attends Religious Services: Never    Database administrator or Organizations: No    Attends Engineer, structural: Never    Marital Status: Married    Tobacco Counseling Counseling given: Not Answered   Clinical Intake:  Pre-visit preparation completed: Yes  Pain : No/denies pain     BMI - recorded: 29.55 Nutritional Risks: None Diabetes: No  How often do you need to have someone help  you when you read instructions, pamphlets, or other written materials from your doctor or pharmacy?: 1 - Never  Interpreter Needed?: No  Information entered by :: R. Alis Sawchuk LPN   Activities of Daily Living     No data to display           Patient Care Team: Sherlene Shams, MD as PCP - General (Internal Medicine) Midge Minium, MD as Consulting Physician (Gastroenterology)  Indicate any recent Medical Services you may have received from other than [facility] providers in the past year (date may be approximate).     Assessment:   This is a routine wellness examination for Vanessa Herman.  Hearing/Vision screen No results found.  Dietary issues and exercise activities discussed:     Goals Addressed             This Visit's Progress    Patient Stated       Wants to work on losing weight. Exercise more      Depression Screen    04/11/2023   10:30 AM 12/28/2022    9:47 AM 06/15/2022    9:38 AM 01/18/2022    2:05 PM 06/23/2020   11:46 AM 12/26/2019    1:39 PM 07/18/2019    2:16 PM  PHQ 2/9 Scores  PHQ - 2 Score 0 0 1 0 0 1 0  PHQ- 9 Score 0     3     Fall Risk    04/11/2023   10:46 AM 12/28/2022    9:47 AM 06/15/2022    9:38 AM 01/18/2022    2:12 PM 10/21/2021   10:58 AM  Fall Risk   Falls in the past year? 0 0 0 0 1  Number falls in past yr: 0 0 0  0  Injury with Fall? 0 0 0  1  Risk for fall due to : No Fall Risks No Fall Risks No Fall Risks  History of fall(s)  Follow up Falls prevention discussed;Falls evaluation completed Falls evaluation completed Falls evaluation completed Falls evaluation completed Falls evaluation completed    MEDICARE RISK AT HOME:  Medicare Risk at Home - 04/11/23 1046     Any stairs in or around the home? Yes    If so, are there any without handrails? Yes    Home free of loose throw rugs in walkways, pet beds, electrical cords, etc? Yes    Adequate lighting in your home to reduce risk of falls? Yes    Life alert? No    Use of a cane, walker or  w/c? No    Grab bars in the bathroom? No    Shower chair or bench in shower? No    Elevated toilet seat or a handicapped toilet? Yes             Cognitive Function:        04/11/2023   10:44 AM  6CIT Screen  What Year? 0 points  What month? 0 points  What time? 0 points  Count back from 20 0 points  Months in reverse 0 points  Repeat phrase 0 points  Total Score 0 points    Immunizations Immunization History  Administered Date(s) Administered   Hep A / Hep B 05/21/2018, 06/25/2018, 11/21/2018   Influenza,inj,Quad PF,6+ Mos 07/12/2018, 06/23/2020   Influenza-Unspecified 07/12/2018, 07/18/2019, 06/23/2020   Tdap 07/30/2018    TDAP status: Up to date  Flu Vaccine status: Declined, Education has been provided regarding the importance of this vaccine but patient still declined. Advised may receive this vaccine at local pharmacy or Health Dept. Aware to provide a copy of the vaccination record if obtained from local pharmacy or Health Dept. Verbalized acceptance and understanding.  Pneumococcal vaccine status: Declined,  Education has been provided regarding the importance of this vaccine but patient still declined. Advised may receive this vaccine at local pharmacy or Health Dept. Aware to provide a copy of the vaccination record if obtained from local pharmacy or Health Dept. Verbalized acceptance and understanding.   Covid-19 vaccine status: Declined, Education has been provided regarding the importance of this vaccine but patient still declined. Advised may receive this vaccine at local pharmacy or Health Dept.or vaccine clinic. Aware to provide a copy of the vaccination record if obtained from local pharmacy or Health Dept. Verbalized acceptance and understanding.  Qualifies for Shingles Vaccine? Yes   Zostavax completed No   Shingrix Completed?: No.    Education has been provided regarding the importance of this vaccine. Patient has been advised to call insurance company  to determine out of pocket expense if they have not yet received this vaccine. Advised may also receive vaccine at local pharmacy or Health Dept. Verbalized acceptance and understanding.  Screening Tests Health Maintenance  Topic Date Due   COVID-19 Vaccine (1) Never done   Pneumonia Vaccine 72+ Years old (1 of 1 - PCV) Never done   FOOT EXAM  12/30/2022   INFLUENZA VACCINE  05/17/2023   HEMOGLOBIN A1C  06/30/2023   OPHTHALMOLOGY EXAM  12/13/2023   Diabetic kidney evaluation - eGFR measurement  12/28/2023   Diabetic kidney evaluation - Urine ACR  12/28/2023   Lung Cancer Screening  01/11/2024   Medicare Annual Wellness (AWV)  04/10/2024   MAMMOGRAM  06/15/2024   DTaP/Tdap/Td (2 - Td or Tdap) 07/30/2028   Colonoscopy  09/15/2032   DEXA SCAN  Completed   Hepatitis C Screening  Completed   HPV VACCINES  Aged Out   Zoster Vaccines- Shingrix  Discontinued    Health Maintenance  Health Maintenance Due  Topic Date Due   COVID-19 Vaccine (1) Never done   Pneumonia Vaccine 65+ Years old (1 of 1 - PCV) Never done   FOOT EXAM  12/30/2022    Colorectal cancer screening: Type of screening: Colonoscopy. Completed 09/15/22. Repeat every 10 years  Mammogram status: Completed 06/15/22. Repeat every year  Bone Density status: Completed 06/14/21. Results reflect: Bone density results: OSTEOPOROSIS. Repeat every 2 years.   Lung Cancer Screening: (Low Dose CT Chest recommended if Age 69-80 years, 20 pack-year currently smoking OR have quit w/in 15years.) does not qualify.     Additional Screening:  Hepatitis C Screening: does qualify; Completed 05/07/18  Vision Screening: Recommended annual ophthalmology exams for early detection of glaucoma and other disorders of the eye. Is the patient up to date with their annual eye exam?  Yes  Who is the provider or what is the name of the office in which the patient attends annual eye exams? [facility] If pt is not established with a provider,  would they like to be referred to a provider to establish care? No .   Dental Screening: Recommended annual dental exams for proper oral hygiene  Diabetic Foot Exam: Diabetic Foot Exam: Overdue, Pt has been advised about the importance in completing this exam. Pt is scheduled for diabetic foot exam on 07/12/23.  Community Resource Referral / Chronic Care Management: CRR required this visit?  No   CCM required this visit?  No     Plan:     I have personally reviewed and noted the following in the patient's chart:   Medical and social history Use of alcohol, tobacco or illicit drugs  Current medications and supplements including opioid prescriptions. Patient is not currently taking opioid prescriptions. Functional ability and status Nutritional status Physical activity Advanced directives List of other physicians Hospitalizations, surgeries, and ER visits in previous 12 months Vitals Screenings to include cognitive, depression, and falls Referrals and appointments  In addition, I have reviewed and discussed with patient certain preventive protocols, quality metrics, and best practice recommendations. A written personalized care plan for preventive services as well as general preventive health recommendations were provided to patient.     Sydell Axon, LPN   1/61/0960   After Visit Summary: (MyChart) Due to this being a telephonic visit, the after visit summary with patients personalized plan was offered to patient via MyChart   Nurse Notes: Patient stopped taking Rosuvastatin because it was causing blurred vision and swollen hands. Patient stated that she started back on Simvastatin and  not having any problems    I have reviewed the above information and agree with above.   Duncan Dull, MD

## 2023-04-11 NOTE — Patient Instructions (Signed)
Vanessa Herman , Thank you for taking time to come for your Medicare Wellness Visit. I appreciate your ongoing commitment to your health goals. Please review the following plan we discussed and let me know if I can assist you in the future.   These are the goals we discussed:  Goals       Patient Stated      Wants to work on losing weight. Exercise more      Weigght goal 115lb (pt-stated)      Increase physical activity Portion control         This is a list of the screening recommended for you and due dates:  Health Maintenance  Topic Date Due   COVID-19 Vaccine (1) Never done   Pneumonia Vaccine (1 of 1 - PCV) Never done   Complete foot exam   12/30/2022   Flu Shot  05/17/2023   Mammogram  06/16/2023   Hemoglobin A1C  06/30/2023   Eye exam for diabetics  12/13/2023   Yearly kidney function blood test for diabetes  12/28/2023   Yearly kidney health urinalysis for diabetes  12/28/2023   Screening for Lung Cancer  01/11/2024   Medicare Annual Wellness Visit  04/10/2024   DTaP/Tdap/Td vaccine (2 - Td or Tdap) 07/30/2028   Colon Cancer Screening  09/15/2032   DEXA scan (bone density measurement)  Completed   Hepatitis C Screening  Completed   HPV Vaccine  Aged Out   Zoster (Shingles) Vaccine  Discontinued    Advanced directives: none, will work on getting forms completed  Conditions/risks identified: none  Next appointment: Follow up in one year for your annual wellness visit. 67/9/25 @ 9:30 telephone  Preventive Care 12-15 Years Old, Female Preventive care refers to lifestyle choices and visits with your health care provider that can promote health and wellness. Preventive care visits are also called wellness exams. What can I expect for my preventive care visit? Counseling During your preventive care visit, your health care provider may ask about your: Medical history, including: Past medical problems. Family medical history. Pregnancy history. Current health,  including: Menstrual cycle. Method of birth control. Emotional well-being. Home life and relationship well-being. Sexual activity and sexual health. Lifestyle, including: Alcohol, nicotine or tobacco, and drug use. Access to firearms. Diet, exercise, and sleep habits. Work and work Astronomer. Sunscreen use. Safety issues such as seatbelt and bike helmet use. Physical exam Your health care provider may check your: Height and weight. These may be used to calculate your BMI (body mass index). BMI is a measurement that tells if you are at a healthy weight. Waist circumference. This measures the distance around your waistline. This measurement also tells if you are at a healthy weight and may help predict your risk of certain diseases, such as type 2 diabetes and high blood pressure. Heart rate and blood pressure. Body temperature. Skin for abnormal spots. What immunizations do I need? Vaccines are usually given at various ages, according to a schedule. Your health care provider will recommend vaccines for you based on your age, medical history, and lifestyle or other factors, such as travel or where you work. What tests do I need? Screening Your health care provider may recommend screening tests for certain conditions. This may include: Pelvic exam and Pap test. Lipid and cholesterol levels. Diabetes screening. This is done by checking your blood sugar (glucose) after you have not eaten for a while (fasting). Hepatitis B test. Hepatitis C test. HIV (human immunodeficiency virus) test. STI (  sexually transmitted infection) testing, if you are at risk. BRCA-related cancer screening. This may be done if you have a family history of breast, ovarian, tubal, or peritoneal cancers. Talk with your health care provider about your test results, treatment options, and if necessary, the need for more tests. Follow these instructions at home: Eating and drinking  Eat a healthy diet that includes  fresh fruits and vegetables, whole grains, lean protein, and low-fat dairy products. Take vitamin and mineral supplements as recommended by your health care provider. Do not drink alcohol if: Your health care provider tells you not to drink. You are pregnant, may be pregnant, or are planning to become pregnant. If you drink alcohol: Limit how much you have to 0-1 drink a day. Know how much alcohol is in your drink. In the [place] one drink equals one 12 oz bottle of beer (355 mL), one 5 oz glass of wine (148 mL), or one 1 oz glass of hard liquor (44 mL). Lifestyle Brush your teeth every morning and night with fluoride toothpaste. Floss one time each day. Exercise for at least 30 minutes 5 or more days each week. Do not use any products that contain nicotine or tobacco. These products include cigarettes, chewing tobacco, and vaping devices, such as e-cigarettes. If you need help quitting, ask your health care provider. Do not use drugs. If you are sexually active, practice safe sex. Use a condom or other form of protection to prevent STIs. If you do not wish to become pregnant, use a form of birth control. If you plan to become pregnant, see your health care provider for a prepregnancy visit. Find healthy ways to manage stress, such as: Meditation, yoga, or listening to music. Journaling. Talking to a trusted person. Spending time with friends and family. Minimize exposure to UV radiation to reduce your risk of skin cancer. Safety Always wear your seat belt while driving or riding in a vehicle. Do not drive: If you have been drinking alcohol. Do not ride with someone who has been drinking. If you have been using any mind-altering substances or drugs. While texting. When you are tired or distracted. Wear a helmet and other protective equipment during sports activities. If you have firearms in your house, make sure you follow all gun safety procedures. Seek help if you have been physically  or sexually abused. What's next? Go to your health care provider once a year for an annual wellness visit. Ask your health care provider how often you should have your eyes and teeth checked. Stay up to date on all vaccines. This information is not intended to replace advice given to you by your health care provider. Make sure you discuss any questions you have with your health care provider. Document Revised: 03/30/2021 Document Reviewed: 03/30/2021 Elsevier Patient Education  2022 [employer].

## 2023-04-26 ENCOUNTER — Ambulatory Visit (INDEPENDENT_AMBULATORY_CARE_PROVIDER_SITE_OTHER): Payer: Medicare Other | Admitting: Gastroenterology

## 2023-04-26 ENCOUNTER — Encounter: Payer: Self-pay | Admitting: Gastroenterology

## 2023-04-26 ENCOUNTER — Other Ambulatory Visit: Payer: Self-pay

## 2023-04-26 VITALS — BP 129/89 | HR 69 | Temp 98.3°F | Ht <= 58 in | Wt 138.0 lb

## 2023-04-26 DIAGNOSIS — K760 Fatty (change of) liver, not elsewhere classified: Secondary | ICD-10-CM | POA: Diagnosis not present

## 2023-04-26 NOTE — Progress Notes (Signed)
Primary Care Physician: Sherlene Shams, MD  Primary Gastroenterologist:  Dr. Midge Minium  Chief Complaint  Patient presents with   New Patient (Initial Visit)    Re-establish    HPI: Vanessa Herman is a 67 y.o. female here with a history of seeing me back in January 2023 for colonoscopy.  The patient also saw me in 2018 for collagenous colitis.  The patient was found to have abnormal liver enzymes and was diagnosed with fatty liver.  The patient had a CT scan of the abdomen back in 2022 with some mild thickening of the colon and consistent with diverticular disease and fatty liver found at that time.  The patient's liver enzymes have shown:  Component     Latest Ref Rng 05/20/2021 12/29/2021 06/15/2022 12/28/2022  Total Bilirubin     0.2 - 1.2 mg/dL 0.6  0.7  0.8  0.8   AST     0 - 37 U/L 36  33  41 (H)  51 (H)   ALT     0 - 35 U/L 46 (H)  37 (H)  51 (H)  64 (H)    The patient's colonoscopy was normal and a repeat was recommended in 10 years. The patient denies any alcohol abuse.  She also denies any past history of alcohol abuse.  She denies any nausea vomiting but she does have left-sided abdominal pain that she feels like is gas because it comes and goes.  Past Medical History:  Diagnosis Date   Allergy    Anxiety    Chicken pox    Collagenous colitis    Dr. Servando Snare    Colon polyps    COVID-19    11/02/20 from husband pnd + with sxs   DDD (degenerative disc disease), cervical    had PT in the past, noted imaging 05/31/10    Depression    Diabetes mellitus without complication (HCC)    Type 2   Fatty liver    Ganglion cyst of wrist, left    Hemorrhoids    Hemorrhoids    History of colon polyps    History of prediabetes    Hyperlipidemia 07/06/2015   Hypertension    IBS (irritable bowel syndrome) 07/06/2015   Lichen sclerosus    NS (neck stiffness)    xray shows something at "C3" approx 4 yrs ago. Had PT. Helped.   Polycythemia, secondary 07/06/2015   Prediabetes     SCC (squamous cell carcinoma)    scalp s/p mohs 11/07/2018 unc Dr. Lorn Junes   Urinary incontinence    Vertigo    occasional   Vitamin D deficiency 07/06/2015    Current Outpatient Medications  Medication Sig Dispense Refill   Accu-Chek Softclix Lancets lancets Use to check blood sugare once daily.  e11.59 200 each 4   aspirin 81 MG tablet Take 81 mg by mouth every other day.      blood glucose meter kit and supplies Dispense based on patient and insurance preference. Use up to four times daily as directed. (FOR ICD-10 E10.9, E11.9). 1 each 0   Calcium Carb-Cholecalciferol 600-400 MG-UNIT TABS Calcium 600 + D(3) 600 mg-10 mcg (400 unit) tablet     Cholecalciferol (VITAMIN D) 2000 units CAPS Vitamin D3 2,000 unit capsule   1 capsule every day by oral route.     clobetasol ointment (TEMOVATE) 0.05 % Apply topically.     COLLAGEN PO Take by mouth.     diazepam (VALIUM) 5 MG tablet  Take 1 tablet (5 mg total) by mouth daily as needed for anxiety. 30 tablet 2   diphenhydrAMINE (BENADRYL) 50 MG tablet Take 50 mg by mouth at bedtime as needed for sleep.      glucose blood (ACCU-CHEK GUIDE) test strip Use to check blood sugar once daily.  e11.59 200 each 4   lisinopril (ZESTRIL) 20 MG tablet Take 1 tablet (20 mg total) by mouth daily. 90 tablet 3   metFORMIN (GLUCOPHAGE) 500 MG tablet Take 1 tablet (500 mg total) by mouth 2 (two) times daily with a meal. 180 tablet 2   metFORMIN (GLUCOPHAGE-XR) 500 MG 24 hr tablet Take 2 tablets (1,000 mg total) by mouth daily with breakfast. 180 tablet 3   Multiple Vitamin (MULTIVITAMIN) tablet Take 1 tablet by mouth every other day.      simvastatin (ZOCOR) 20 MG tablet Take 20 mg by mouth daily.     metoprolol succinate (TOPROL-XL) 50 MG 24 hr tablet Take 1 tablet (50 mg total) by mouth every evening. 90 tablet 3   No current facility-administered medications for this visit.    Allergies as of 04/26/2023 - Review Complete 04/26/2023  Allergen Reaction Noted    Adhesive [tape] Other (See Comments) 09/01/2019   Atorvastatin Swelling and Other (See Comments) 06/08/2015   Tilactase Diarrhea 05/07/2018   Neomycin Other (See Comments) 05/07/2018   Pollen extract Other (See Comments) 05/07/2018   Crestor [rosuvastatin] Other (See Comments) 04/11/2023   Levaquin [levofloxacin in d5w] Nausea And Vomiting 07/06/2015    ROS:  General: Negative for anorexia, weight loss, fever, chills, fatigue, weakness. ENT: Negative for hoarseness, difficulty swallowing , nasal congestion. CV: Negative for chest pain, angina, palpitations, dyspnea on exertion, peripheral edema.  Respiratory: Negative for dyspnea at rest, dyspnea on exertion, cough, sputum, wheezing.  GI: See history of present illness. GU:  Negative for dysuria, hematuria, urinary incontinence, urinary frequency, nocturnal urination.  Endo: Negative for unusual weight change.    Physical Examination:   BP (!) 140/86 (BP Location: Left Arm, Patient Position: Sitting, Cuff Size: Normal)   Pulse 78   Temp 98.3 F (36.8 C) (Oral)   Ht 4\' 10"  (1.473 m)   Wt 138 lb (62.6 kg)   BMI 28.84 kg/m   General: Well-nourished, well-developed in no acute distress.  Eyes: No icterus. Conjunctivae pink. Lungs: Clear to auscultation bilaterally. Non-labored. Heart: Regular rate and rhythm, no murmurs rubs or gallops.  Abdomen: Bowel sounds are normal, nontender, nondistended, no hepatosplenomegaly or masses, no abdominal bruits or hernia , no rebound or guarding.   Extremities: No lower extremity edema. No clubbing or deformities. Neuro: Alert and oriented x 3.  Grossly intact. Skin: Warm and dry, no jaundice.   Psych: Alert and cooperative, normal mood and affect.  Labs:    Imaging Studies: No results found.  Assessment and Plan:   Vanessa Herman is a 67 y.o. y/o female who comes in today with a finding of fatty liver.  The patient reports that she has a hard time losing weight.  The patient has been  told about the Mediterranean diet.  The patient has also been told that a 7 pound weight loss could decrease the amount of fat in her liver.  She will also have her lab sent off for fibrosis scoring and for other possible cause of abnormal liver enzymes.  The patient has been explained the plan and agrees with it.     Midge Minium, MD. Clementeen Graham    Note: This  dictation was prepared with Dragon dictation along with smaller phrase technology. Any transcriptional errors that result from this process are unintentional.

## 2023-04-27 LAB — NASH FIBROSURE(R) PLUS

## 2023-04-27 LAB — ANTI-SMOOTH MUSCLE ANTIBODY, IGG: Smooth Muscle Ab: 11 Units (ref 0–19)

## 2023-04-27 LAB — ANA: Anti Nuclear Antibody (ANA): NEGATIVE

## 2023-04-27 LAB — CERULOPLASMIN: Ceruloplasmin: 20.3 mg/dL (ref 19.0–39.0)

## 2023-04-28 LAB — NASH FIBROSURE(R) PLUS
ALT (SGPT) P5P: 55 IU/L — ABNORMAL HIGH (ref 0–40)
Bilirubin, Total: 0.4 mg/dL (ref 0.0–1.2)
Cholesterol, Total: 133 mg/dL (ref 100–199)
Fibrosis Score: 0.08 (ref 0.00–0.21)
GGT: 24 IU/L (ref 0–60)
NASH Score: 0.82 — ABNORMAL HIGH (ref 0.00–0.25)
Steatosis Score: 0.7 — ABNORMAL HIGH (ref 0.00–0.40)
Triglycerides: 268 mg/dL — ABNORMAL HIGH (ref 0–149)

## 2023-04-28 LAB — MITOCHONDRIAL ANTIBODIES: Mitochondrial Ab: <20 Units (ref 0.0–20.0)

## 2023-04-28 LAB — ALPHA-1-ANTITRYPSIN: A-1 Antitrypsin: 101 mg/dL (ref 101–187)

## 2023-05-01 ENCOUNTER — Encounter: Payer: Self-pay | Admitting: Gastroenterology

## 2023-05-22 ENCOUNTER — Ambulatory Visit (INDEPENDENT_AMBULATORY_CARE_PROVIDER_SITE_OTHER): Payer: Medicare Other | Admitting: Nurse Practitioner

## 2023-05-22 ENCOUNTER — Encounter: Payer: Self-pay | Admitting: Nurse Practitioner

## 2023-05-22 VITALS — BP 128/78 | HR 79 | Temp 98.4°F | Ht <= 58 in | Wt 135.0 lb

## 2023-05-22 DIAGNOSIS — L309 Dermatitis, unspecified: Secondary | ICD-10-CM | POA: Insufficient documentation

## 2023-05-22 DIAGNOSIS — R21 Rash and other nonspecific skin eruption: Secondary | ICD-10-CM | POA: Diagnosis not present

## 2023-05-22 MED ORDER — PREDNISONE 10 MG PO TABS: ORAL_TABLET | ORAL | 0 refills | Status: DC

## 2023-05-22 MED ORDER — HYDROXYZINE PAMOATE 25 MG PO CAPS: 25.0000 mg | ORAL_CAPSULE | Freq: Every evening | ORAL | 0 refills | Status: DC | PRN

## 2023-05-22 NOTE — Progress Notes (Unsigned)
Established Patient Office Visit  Subjective:  Patient ID: Vanessa Herman, female    DOB: 1956-04-10  Age: 67 y.o. MRN: [id number]  CC:  Chief Complaint  Patient presents with   Acute Visit    Rash both sides by waistline, right arm, hand & finger and left side of face    HPI  Vanessa Herman presents for rash. The rash started on Friday after she has done mowing her grass.  Rash This is a new problem. The current episode started in the past 7 days. The problem has been gradually worsening since onset. The affected locations include the face and right arm (erythematous patch on the left waistline, red spot on the right waistline,e right lower arm, left eyebrow and left side of the face). The rash is characterized by redness and itchiness. She was exposed to a new detergent/soap and chemicals. Pertinent negatives include no facial edema, fever or shortness of breath. Past treatments include topical steroids. The treatment provided mild relief. Her past medical history is significant for varicella.     Past Medical History:  Diagnosis Date   Allergy    Anxiety    Chicken pox    Collagenous colitis    Dr. Servando Snare    Colon polyps    COVID-19    11/02/20 from husband pnd + with sxs   DDD (degenerative disc disease), cervical    had PT in the past, noted imaging 05/31/10    Depression    Diabetes mellitus without complication (HCC)    Type 2   Fatty liver    Ganglion cyst of wrist, left    Hemorrhoids    Hemorrhoids    History of colon polyps    History of prediabetes    Hyperlipidemia 07/06/2015   Hypertension    IBS (irritable bowel syndrome) 07/06/2015   Lichen sclerosus    NS (neck stiffness)    xray shows something at "C3" approx 4 yrs ago. Had PT. Helped.   Polycythemia, secondary 07/06/2015   Prediabetes    SCC (squamous cell carcinoma)    scalp s/p mohs 11/07/2018 unc Dr. Lorn Junes   Urinary incontinence    Vertigo    occasional   Vitamin D deficiency 07/06/2015     Past Surgical History:  Procedure Laterality Date   BREAST BIOPSY Left 06/30/2021   [place] bx, coil marker, BENIGN MAMMARY PARENCHYMA WITH MILD STROMAL FIBROSIS. - NEGATIVE FOR ATYPICAL PROLIFERATIVE   COLONOSCOPY  2006   COLONOSCOPY WITH PROPOFOL N/A 08/12/2015   Procedure: COLONOSCOPY WITH PROPOFOL;  Surgeon: Midge Minium, MD;  Location: [facility];  Service: Endoscopy;  Laterality: N/A;  PT WOULD LIKE EARLY AM PER JS   COLONOSCOPY WITH PROPOFOL N/A 09/15/2022   Procedure: COLONOSCOPY WITH PROPOFOL;  Surgeon: Midge Minium, MD;  Location: [facility];  Service: Endoscopy;  Laterality: N/A;   EYE SURGERY     right eye 05/17/20 and 06/07/20 Dr. Valora Piccolo my eye MD f/u 06/2020 cataract surgery narrow angles   HYSTEROSALPINGOGRAM     1983   TONSILLECTOMY AND ADENOIDECTOMY     age [age]    Family History  Problem Relation Age of Onset   Breast cancer Maternal Grandmother 56   Cancer Maternal Grandmother        breast   Hypertension Father    Hyperlipidemia Father    COPD Father    Hearing loss Father    Heart disease Father    Heart attack Father    Diabetes  Mother    Hyperlipidemia Mother    Stroke Mother    Thyroid disease Daughter    Heart disease Maternal Grandfather    Heart attack Maternal Grandfather    Heart disease Paternal Grandfather    Heart attack Paternal Grandfather    Osteoporosis Other    Heart disease Paternal Grandmother     Social History   Socioeconomic History   Marital status: Married    Spouse name: Not on file   Number of children: Not on file   Years of education: Not on file   Highest education level: Not on file  Occupational History   Not on file  Tobacco Use   Smoking status: Former    Current packs/day: 0.00    Average packs/day: 1.3 packs/day for 42.0 years (52.5 ttl pk-yrs)    Types: Cigarettes    Start date: 04/03/1972    Quit date: 04/03/2014    Years since quitting: 9.1   Smokeless tobacco: Never  Vaping Use   Vaping  status: Never Used  Substance and Sexual Activity   Alcohol use: Yes    Comment: occasionally   Drug use: No   Sexual activity: Not Currently  Other Topics Concern   Not on file  Social History Narrative   Married    Some college, retired    2 daughters , one has bipolar disorder    No guns    Wears seat belt    Safe in relationship    Social Determinants of Health   Financial Resource Strain: Low Risk  (04/11/2023)   Overall Financial Resource Strain (CARDIA)    Difficulty of Paying Living Expenses: Not hard at all  Food Insecurity: No Food Insecurity (04/11/2023)   Hunger Vital Sign    Worried About Running Out of Food in the Last Year: Never true    Ran Out of Food in the Last Year: Never true  Transportation Needs: No Transportation Needs (04/11/2023)   PRAPARE - Administrator, Civil Service (Medical): No    Lack of Transportation (Non-Medical): No  Physical Activity: Sufficiently Active (05/22/2023)   Exercise Vital Sign    Days of Exercise per Week: 3 days    Minutes of Exercise per Session: 150+ min  Recent Concern: Physical Activity - Inactive (04/11/2023)   Exercise Vital Sign    Days of Exercise per Week: 0 days    Minutes of Exercise per Session: 0 min  Stress: Stress Concern Present (05/22/2023)   [employer] of Occupational Health - Occupational Stress Questionnaire    Feeling of Stress : Very much  Social Connections: Moderately Isolated (04/11/2023)   Social Connection and Isolation Panel [NHANES]    Frequency of Communication with Friends and Family: More than three times a week    Frequency of Social Gatherings with Friends and Family: More than three times a week    Attends Religious Services: Never    Database administrator or Organizations: No    Attends Banker Meetings: Never    Marital Status: Married  Catering manager Violence: Not At Risk (04/11/2023)   Humiliation, Afraid, Rape, and Kick questionnaire    Fear of Current  or Ex-Partner: No    Emotionally Abused: No    Physically Abused: No    Sexually Abused: No     Outpatient Medications Prior to Visit  Medication Sig Dispense Refill   Accu-Chek Softclix Lancets lancets Use to check blood sugare once daily.  e11.59 200 each  4   aspirin 81 MG tablet Take 81 mg by mouth every other day.      blood glucose meter kit and supplies Dispense based on patient and insurance preference. Use up to four times daily as directed. (FOR ICD-10 E10.9, E11.9). 1 each 0   Calcium Carb-Cholecalciferol 600-400 MG-UNIT TABS Calcium 600 + D(3) 600 mg-10 mcg (400 unit) tablet     Cholecalciferol (VITAMIN D) 2000 units CAPS Vitamin D3 2,000 unit capsule   1 capsule every day by oral route.     clobetasol ointment (TEMOVATE) 0.05 % Apply topically.     COLLAGEN PO Take by mouth.     diazepam (VALIUM) 5 MG tablet Take 1 tablet (5 mg total) by mouth daily as needed for anxiety. 30 tablet 2   glucose blood (ACCU-CHEK GUIDE) test strip Use to check blood sugar once daily.  e11.59 200 each 4   lisinopril (ZESTRIL) 20 MG tablet Take 1 tablet (20 mg total) by mouth daily. 90 tablet 3   metFORMIN (GLUCOPHAGE) 500 MG tablet Take 1 tablet (500 mg total) by mouth 2 (two) times daily with a meal. 180 tablet 2   metFORMIN (GLUCOPHAGE-XR) 500 MG 24 hr tablet Take 2 tablets (1,000 mg total) by mouth daily with breakfast. 180 tablet 3   Multiple Vitamin (MULTIVITAMIN) tablet Take 1 tablet by mouth every other day.      simvastatin (ZOCOR) 20 MG tablet Take 20 mg by mouth daily.     diphenhydrAMINE (BENADRYL) 50 MG tablet Take 50 mg by mouth at bedtime as needed for sleep.      metoprolol succinate (TOPROL-XL) 50 MG 24 hr tablet Take 1 tablet (50 mg total) by mouth every evening. 90 tablet 3   No facility-administered medications prior to visit.    Allergies  Allergen Reactions   Adhesive [Tape] Other (See Comments)    Severe skin breakdown even paper tape. Close attention to any adhesives.  Friable skin   Atorvastatin Swelling and Other (See Comments)    Swelling and leg pain   Tilactase Diarrhea   Neomycin Other (See Comments)    Nausea     Pollen Extract Other (See Comments)   Crestor [Rosuvastatin] Other (See Comments)    Blurred vision on 3 occasions and for entire time eyes very sensitive to light, hands and finger joints swollen, tingling below the knees   Levaquin [Levofloxacin In D5w] Nausea And Vomiting    Lightheadedness     ROS Review of Systems  Constitutional:  Negative for fever.  Respiratory:  Negative for shortness of breath.   Skin:  Positive for rash.   Negative unless indicated in HPI.    Objective:    Physical Exam Constitutional:      Appearance: Normal appearance.  Cardiovascular:     Rate and Rhythm: Normal rate and regular rhythm.     Pulses: Normal pulses.     Heart sounds: Normal heart sounds.  Skin:    Findings: Erythema and rash present.          Comments: erythematous patch on the left waistline and macute on the right waistline, scattered macules and papules on the right lower arm and a patch on the left eyebrow and  on the left side of the face  Neurological:     Mental Status: She is alert.     BP 128/78   Pulse 79   Temp 98.4 F (36.9 C)   Ht 4\' 10"  (1.473 m)   Wt 135  lb (61.2 kg)   SpO2 98%   BMI 28.22 kg/m  Wt Readings from Last 3 Encounters:  05/22/23 135 lb (61.2 kg)  04/26/23 138 lb (62.6 kg)  04/11/23 139 lb (63 kg)     Health Maintenance  Topic Date Due   COVID-19 Vaccine (1) Never done   Pneumonia Vaccine 46+ Years old (1 of 1 - PCV) Never done   FOOT EXAM  12/30/2022   INFLUENZA VACCINE  01/14/2024 (Originally 05/17/2023)   MAMMOGRAM  06/16/2023   HEMOGLOBIN A1C  06/30/2023   OPHTHALMOLOGY EXAM  12/13/2023   Diabetic kidney evaluation - eGFR measurement  12/28/2023   Diabetic kidney evaluation - Urine ACR  12/28/2023   Lung Cancer Screening  01/11/2024   Medicare Annual Wellness (AWV)   04/10/2024   DTaP/Tdap/Td (2 - Td or Tdap) 07/30/2028   Colonoscopy  09/15/2032   DEXA SCAN  Completed   Hepatitis C Screening  Completed   HPV VACCINES  Aged Out   Zoster Vaccines- Shingrix  Discontinued    There are no preventive care reminders to display for this patient.  Lab Results  Component Value Date   TSH 1.55 12/28/2022   Lab Results  Component Value Date   WBC 6.9 12/28/2022   HGB 15.0 12/28/2022   HCT 44.5 12/28/2022   MCV 91.2 12/28/2022   PLT 302.0 12/28/2022   Lab Results  Component Value Date   NA 136 12/28/2022   K 4.6 12/28/2022   CO2 25 12/28/2022   GLUCOSE 103 (H) 12/28/2022   BUN 10 12/28/2022   CREATININE 0.78 12/28/2022   BILITOT 0.8 12/28/2022   ALKPHOS 55 12/28/2022   AST 51 (H) 12/28/2022   ALT 64 (H) 12/28/2022   PROT 7.2 12/28/2022   ALBUMIN 4.8 12/28/2022   CALCIUM 10.2 12/28/2022   GFR 78.64 12/28/2022   Lab Results  Component Value Date   CHOL 133 04/26/2023   Lab Results  Component Value Date   HDL 51.30 12/28/2022   Lab Results  Component Value Date   LDLCALC 38 12/28/2022   Lab Results  Component Value Date   TRIG 268 (H) 04/26/2023   Lab Results  Component Value Date   CHOLHDL 2 12/28/2022   Lab Results  Component Value Date   HGBA1C 6.2 (A) 12/28/2022      Assessment & Plan:  Rash and nonspecific skin eruption Assessment & Plan: Will treat with prednisone tapering 6 days and hydroxyzine. Continue Benadryl at nighttime Advised patient to use cool compress and hydrocortisone cream for itching. Advised patient to call the office if symptoms not improving.   Other orders -     hydrOXYzine Pamoate; Take 1 capsule (25 mg total) by mouth at bedtime as needed and may repeat dose one time if needed for anxiety.  Dispense: 30 capsule; Refill: 0 -     predniSONE; Take 4 tablets ( total 40 mg) by mouth for 2 days; take 3 tablets ( total 30 mg) by mouth for 2 days; take 2 tablets ( total 20 mg) by mouth for 1 day;  take 1 tablet ( total 10 mg) by mouth for 1 day.  Dispense: 17 tablet; Refill: 0    Follow-up: Return if symptoms worsen or fail to improve.   Kara Dies, NP

## 2023-05-22 NOTE — Patient Instructions (Addendum)
Stop Benadryl at night time. Take prednisone in the morning and hydroxyzine at bed time.

## 2023-05-23 ENCOUNTER — Encounter: Payer: Self-pay | Admitting: Nurse Practitioner

## 2023-05-23 DIAGNOSIS — R21 Rash and other nonspecific skin eruption: Secondary | ICD-10-CM | POA: Insufficient documentation

## 2023-05-23 NOTE — Assessment & Plan Note (Addendum)
Will treat with prednisone tapering 6 days and hydroxyzine. Continue Benadryl at nighttime Advised patient to use cool compress and hydrocortisone cream for itching. Advised patient to call the office if symptoms not improving.

## 2023-05-31 ENCOUNTER — Encounter (INDEPENDENT_AMBULATORY_CARE_PROVIDER_SITE_OTHER): Payer: Self-pay

## 2023-06-19 ENCOUNTER — Ambulatory Visit
Admission: RE | Admit: 2023-06-19 | Discharge: 2023-06-19 | Disposition: A | Discharge status: Home / Self Care | Payer: Medicare Other | Source: Ambulatory Visit | Attending: Internal Medicine | Admitting: Internal Medicine

## 2023-06-19 DIAGNOSIS — Z0000 Encounter for general adult medical examination without abnormal findings: Secondary | ICD-10-CM | POA: Insufficient documentation

## 2023-06-19 DIAGNOSIS — Z1231 Encounter for screening mammogram for malignant neoplasm of breast: Secondary | ICD-10-CM | POA: Diagnosis present

## 2023-06-19 DIAGNOSIS — Q782 Osteopetrosis: Secondary | ICD-10-CM

## 2023-06-19 DIAGNOSIS — M810 Age-related osteoporosis without current pathological fracture: Secondary | ICD-10-CM

## 2023-06-21 ENCOUNTER — Other Ambulatory Visit: Payer: Medicare Other

## 2023-07-12 ENCOUNTER — Encounter: Payer: Self-pay | Admitting: Internal Medicine

## 2023-07-12 ENCOUNTER — Ambulatory Visit: Payer: Medicare Other | Admitting: Internal Medicine

## 2023-07-12 VITALS — BP 110/82 | HR 71 | Ht <= 58 in | Wt 136.2 lb

## 2023-07-12 DIAGNOSIS — G8929 Other chronic pain: Secondary | ICD-10-CM

## 2023-07-12 DIAGNOSIS — I10 Essential (primary) hypertension: Secondary | ICD-10-CM

## 2023-07-12 DIAGNOSIS — E782 Mixed hyperlipidemia: Secondary | ICD-10-CM | POA: Diagnosis not present

## 2023-07-12 DIAGNOSIS — M25561 Pain in right knee: Secondary | ICD-10-CM

## 2023-07-12 DIAGNOSIS — K760 Fatty (change of) liver, not elsewhere classified: Secondary | ICD-10-CM

## 2023-07-12 DIAGNOSIS — R7303 Prediabetes: Secondary | ICD-10-CM

## 2023-07-12 DIAGNOSIS — Z7984 Long term (current) use of oral hypoglycemic drugs: Secondary | ICD-10-CM

## 2023-07-12 DIAGNOSIS — E119 Type 2 diabetes mellitus without complications: Secondary | ICD-10-CM | POA: Diagnosis not present

## 2023-07-12 LAB — LDL CHOLESTEROL, DIRECT: Direct LDL: 56 mg/dL

## 2023-07-12 LAB — LIPID PANEL
Cholesterol: 124 mg/dL (ref 0–200)
HDL: 59.3 mg/dL (ref >39.00)
LDL Cholesterol: 41 mg/dL (ref 0–99)
NonHDL: 64.33
Total CHOL/HDL Ratio: 2
Triglycerides: 115 mg/dL (ref 0.0–149.0)
VLDL: 23 mg/dL (ref 0.0–40.0)

## 2023-07-12 LAB — COMPREHENSIVE METABOLIC PANEL
ALT: 47 U/L — ABNORMAL HIGH (ref 0–35)
AST: 39 U/L — ABNORMAL HIGH (ref 0–37)
Albumin: 4.7 g/dL (ref 3.5–5.2)
Alkaline Phosphatase: 51 U/L (ref 39–117)
BUN: 10 mg/dL (ref 6–23)
CO2: 28 mEq/L (ref 19–32)
Calcium: 9.7 mg/dL (ref 8.4–10.5)
Chloride: 99 mEq/L (ref 96–112)
Creatinine, Ser: 0.73 mg/dL (ref 0.40–1.20)
GFR: 84.83 mL/min (ref >60.00)
Glucose, Bld: 88 mg/dL (ref 70–99)
Potassium: 4.1 mEq/L (ref 3.5–5.1)
Sodium: 136 mEq/L (ref 135–145)
Total Bilirubin: 0.7 mg/dL (ref 0.2–1.2)
Total Protein: 7.2 g/dL (ref 6.0–8.3)

## 2023-07-12 LAB — HEMOGLOBIN A1C: Hgb A1c MFr Bld: 6.4 % (ref 4.6–6.5)

## 2023-07-12 MED ORDER — ACCU-CHEK GUIDE VI STRP
ORAL_STRIP | 4 refills | Status: DC
Start: 2023-07-12 — End: 2024-08-11

## 2023-07-12 MED ORDER — METOPROLOL SUCCINATE ER 50 MG PO TB24: 50.0000 mg | ORAL_TABLET | Freq: Every evening | ORAL | 3 refills | Status: DC

## 2023-07-12 MED ORDER — ACCU-CHEK SOFTCLIX LANCETS MISC
4 refills | Status: DC
Start: 2023-07-12 — End: 2024-08-19

## 2023-07-12 MED ORDER — METFORMIN HCL ER 500 MG PO TB24
1000.0000 mg | ORAL_TABLET | Freq: Every day | ORAL | 3 refills | Status: DC
Start: 2023-07-12 — End: 2024-07-08

## 2023-07-12 MED ORDER — LISINOPRIL 20 MG PO TABS
20.0000 mg | ORAL_TABLET | Freq: Every day | ORAL | 3 refills | Status: DC
Start: 2023-07-12 — End: 2024-07-08

## 2023-07-12 NOTE — Patient Instructions (Addendum)
I Tabb your change in diet to Mediterranean!    FOR KNEE PAIN:  You can add up to  1000 mg of acetominophen (tylenol) every day safely  In divided doses (500 mg every 6 hours  Or 1000 mg every 12 hours.)   YOU CAN USE ICE OR TOPICAL VOLTAREN    PLEASE CONSIDER GETTING THE PREVNAR 20 PNEUMONIA  VACCINE  AND THE INFLUENZA VACCINES

## 2023-07-13 NOTE — Assessment & Plan Note (Signed)
Moderate to marked by CT Abdomen in 2021 .  Current liver enzymes are mildly elevated but improving,  she has changed her diet and using metformin and statin,  hS BEEN REFERRED to GI   Lab Results  Component Value Date   ALT 47 (H) 07/12/2023   AST 39 (H) 07/12/2023   ALKPHOS 51 07/12/2023   BILITOT 0.7 07/12/2023

## 2023-07-13 NOTE — Assessment & Plan Note (Signed)
well-controlled on metformin .  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   She is taking a medium potency statin and an ACE inhibitor.  Lab Results  Component Value Date   HGBA1C 6.4 07/12/2023   Lab Results  Component Value Date   LABMICR Comment 05/07/2018   MICROALBUR <0.7 12/28/2022   MICROALBUR <0.2 12/29/2021      Lab Results  Component Value Date   CHOL 124 07/12/2023   HDL 59.30 07/12/2023   LDLCALC 41 07/12/2023   LDLDIRECT 56.0 07/12/2023   TRIG 115.0 07/12/2023   CHOLHDL 2 07/12/2023

## 2023-07-13 NOTE — Assessment & Plan Note (Signed)
Recommend use of tylenol and voltaren gel.

## 2023-07-13 NOTE — Assessment & Plan Note (Signed)
Well controlled on current regimen. Renal function stable, no changes today.   Lab Results  Component Value Date   CREATININE 0.73 07/12/2023   Lab Results  Component Value Date   NA 136 07/12/2023   K 4.1 07/12/2023   CL 99 07/12/2023   CO2 28 07/12/2023

## 2023-08-06 NOTE — Progress Notes (Unsigned)
Cardiology Office Note  Date:  08/07/2023   ID:  [name], [name] May 17, 1956, MRN [id number]  PCP:  Sherlene Shams, MD   Chief Complaint  Patient presents with   12 month follow up     Patient c/o palpitations at times. Medications reviewed by the patient verbally.     HPI:  Vanessa Herman is a 67 year old woman with past medical history of  coronary calcification, aortic atherosclerosis on CT scan 2019 Aortic atherosclerosis Mild in the arch,  Diabetes type 2 Hypertension Obesity Stopped smoking 5 yrs ago. Echo November 2020 EF 60 to 65% Presents for f/u of her palpitations and family history of coronary disease.  Aortic atherosclerosis and coronary calcification  Last seen by myself in clinic October 2023 Down 10 pounds in one year through dietary changes Husband with dementia, stress at home Reports that sometimes she needs her diazepam but prescription has run out  Rare "hard heart beat", feels like 3 beats then goes back to normal  Reports off her simvastatin x 1 day, no side effects Just did not want to be on a pharmaceutical  CT scan March 2024 reviewed  with coronary calcifications, aortic atherosclerosis  Active, spends time in her garden Runs around to take care of her husband  BP well-controlled at home On metoprolol succinate 50 at night and lisinopril 20  Labs reviewed  total chol 124, LDL 56,  HBA1C 6.4,  AST 39 ALT 47 creatinine 0.73  EKG personally reviewed by myself on todays visit EKG Interpretation Date/Time:  Tuesday August 07 2023 09:08:50 EDT Ventricular Rate:  75 PR Interval:  130 QRS Duration:  84 QT Interval:  392 QTC Calculation: 437 R Axis:   -3  Text Interpretation: Normal sinus rhythm Possible Left atrial enlargement No previous ECGs available Confirmed by Julien Nordmann [phone]) on 08/07/2023 9:10:27 AM   Echo 08/2019  EF normal  CT scan chest  Coronary calcifications  FH dad and both grandparents heart dz,   Prior  CT scan August 2019 Showing coronary calcification, aortic atherosclerosis  Prior carotid ultrasound August 2019 Minor carotid atherosclerosis less than 50% bilaterally   PMH:   has a past medical history of Allergy, Anxiety, Chicken pox, Collagenous colitis, Colon polyps, COVID-19, DDD (degenerative disc disease), cervical, Depression, Diabetes mellitus without complication (HCC), Fatty liver, Ganglion cyst of wrist, left, Hemorrhoids, Hemorrhoids, History of colon polyps, History of prediabetes, Hyperlipidemia (07/06/2015), Hypertension, IBS (irritable bowel syndrome) (07/06/2015), Lichen sclerosus, NS (neck stiffness), Polycythemia, secondary (07/06/2015), Prediabetes, SCC (squamous cell carcinoma), Urinary incontinence, Vertigo, and Vitamin D deficiency (07/06/2015).  PSH:    Past Surgical History:  Procedure Laterality Date   BREAST BIOPSY Left 06/30/2021   [place] bx, coil marker, BENIGN MAMMARY PARENCHYMA WITH MILD STROMAL FIBROSIS. - NEGATIVE FOR ATYPICAL PROLIFERATIVE   COLONOSCOPY  2006   COLONOSCOPY WITH PROPOFOL N/A 08/12/2015   Procedure: COLONOSCOPY WITH PROPOFOL;  Surgeon: Midge Minium, MD;  Location: [facility];  Service: Endoscopy;  Laterality: N/A;  PT WOULD LIKE EARLY AM PER JS   COLONOSCOPY WITH PROPOFOL N/A 09/15/2022   Procedure: COLONOSCOPY WITH PROPOFOL;  Surgeon: Midge Minium, MD;  Location: [facility];  Service: Endoscopy;  Laterality: N/A;   EYE SURGERY     right eye 05/17/20 and 06/07/20 Dr. Valora Piccolo my eye MD f/u 06/2020 cataract surgery narrow angles   HYSTEROSALPINGOGRAM     1983   TONSILLECTOMY AND ADENOIDECTOMY     age [age]    Current Outpatient Medications  Medication Sig Dispense Refill   Accu-Chek Softclix Lancets lancets Use to check blood sugare once daily.  e11.59 200 each 4   Ascorbic Acid (VITAMIN C DROPS MT) Take 500 mg by mouth as needed.     aspirin 81 MG tablet Take 81 mg by mouth every other day.      blood glucose meter kit and  supplies Dispense based on patient and insurance preference. Use up to four times daily as directed. (FOR ICD-10 E10.9, E11.9). 1 each 0   Calcium Carb-Cholecalciferol 600-400 MG-UNIT TABS Calcium 600 + D(3) 600 mg-10 mcg (400 unit) tablet     Cholecalciferol (VITAMIN D) 2000 units CAPS Vitamin D3 2,000 unit capsule   1 capsule every day by oral route.     clobetasol ointment (TEMOVATE) 0.05 % Apply topically.     Coenzyme Q10 (CO Q 10) 100 MG CAPS Take 1 capsule by mouth 3 (three) times a week.     glucose blood (ACCU-CHEK GUIDE) test strip Use to check blood sugar once daily.  e11.59 200 each 4   lisinopril (ZESTRIL) 20 MG tablet Take 1 tablet (20 mg total) by mouth daily. 90 tablet 3   metFORMIN (GLUCOPHAGE) 500 MG tablet Take 1 tablet (500 mg total) by mouth 2 (two) times daily with a meal. 180 tablet 2   metFORMIN (GLUCOPHAGE-XR) 500 MG 24 hr tablet Take 2 tablets (1,000 mg total) by mouth daily with breakfast. 180 tablet 3   metoprolol succinate (TOPROL-XL) 50 MG 24 hr tablet Take 1 tablet (50 mg total) by mouth every evening. 90 tablet 3   Multiple Vitamin (MULTIVITAMIN) tablet Take 1 tablet by mouth every other day.      Zinc 50 MG TABS Take 50 mg by mouth as needed.     diazepam (VALIUM) 5 MG tablet Take 1 tablet (5 mg total) by mouth daily as needed for anxiety. (Patient not taking: Reported on 07/12/2023) 30 tablet 2   simvastatin (ZOCOR) 20 MG tablet Take 0.5 tablets (10 mg total) by mouth daily.     No current facility-administered medications for this visit.     Allergies:   Adhesive [tape], Atorvastatin, Tilactase, Neomycin, Pollen extract, Crestor [rosuvastatin], and Levaquin [levofloxacin in d5w]   Social History:  The patient  reports that she quit smoking about 9 years ago. Her smoking use included cigarettes. She started smoking about 51 years ago. She has a 52.5 pack-year smoking history. She has never used smokeless tobacco. She reports current alcohol use. She reports that  she does not use drugs.   Family History:   family history includes Breast cancer (age of onset: 47) in her maternal grandmother; COPD in her father; Cancer in her maternal grandmother; Diabetes in her mother; Hearing loss in her father; Heart attack in her father, maternal grandfather, and paternal grandfather; Heart disease in her father, maternal grandfather, paternal grandfather, and paternal grandmother; Hyperlipidemia in her father and mother; Hypertension in her father; Osteoporosis in an other family member; Stroke in her mother; Thyroid disease in her daughter.    Review of Systems: Review of Systems  Constitutional: Negative.   HENT: Negative.    Respiratory: Negative.    Cardiovascular: Negative.   Gastrointestinal: Negative.   Musculoskeletal: Negative.   Neurological: Negative.   Psychiatric/Behavioral: Negative.    All other systems reviewed and are negative.   PHYSICAL EXAM: VS:  BP (!) 140/80 (BP Location: Left Arm, Patient Position: Sitting, Cuff Size: Normal)   Pulse 75   Ht  4\' 10"  (1.473 m)   Wt 134 lb 8 oz (61 kg)   SpO2 98%   BMI 28.11 kg/m  , BMI Body mass index is 28.11 kg/m. Constitutional:  oriented to person, place, and time. No distress.  HENT:  Head: Grossly normal Eyes:  no discharge. No scleral icterus.  Neck: No JVD, no carotid bruits  Cardiovascular: Regular rate and rhythm, no murmurs appreciated Pulmonary/Chest: Clear to auscultation bilaterally, no wheezes or rails Abdominal: Soft.  no distension.  no tenderness.  Musculoskeletal: Normal range of motion Neurological:  normal muscle tone. Coordination normal. No atrophy Skin: Skin warm and dry Psychiatric: normal affect, pleasant  Recent Labs: 12/28/2022: Hemoglobin 15.0; Platelets 302.0; TSH 1.55 07/12/2023: ALT 47; BUN 10; Creatinine, Ser 0.73; Potassium 4.1; Sodium 136    Lipid Panel Lab Results  Component Value Date   CHOL 124 07/12/2023   HDL 59.30 07/12/2023   LDLCALC 41  07/12/2023   TRIG 115.0 07/12/2023      Wt Readings from Last 3 Encounters:  08/07/23 134 lb 8 oz (61 kg)  07/12/23 136 lb 3.2 oz (61.8 kg)  05/22/23 135 lb (61.2 kg)     ASSESSMENT AND PLAN:  Aortic atherosclerosis (HCC) Mild in the arch, and coronary calcification Stopped smoking >5 yrs ago Lipids at goal, recommended she restart her simvastatin at least 10 mg daily She had stopped the simvastatin because she did not want to be on medication Reviewed cholesterol numbers with her, she will restart A1c 6.4, weight down 10 pounds  Coronary artery calcification seen on CAT scan mild disease, LAD and LCX Cholesterol at goal, nonsmoker Restart simvastatin at least 10 mg daily Weight is down, denies anginal symptoms, normal EKG  Essential hypertension Blood pressure high end of her range today, reports it is well-controlled at home No changes made  Controlled type 2 diabetes mellitus without complication, without long-term current use of insulin (HCC) Weight is down 10 pounds, A1c stable 6.4  Palpitations Controlled with metoprolol Rare 3 beat symptoms For worsening symptoms a Zio monitor could be ordered, this was discussed with her  Nonspecific chest pain No chest pain sx No further workup needed at this time  Adjustment disorder Discussed husband who has dementia, adding to her stress at home Reports that sometimes she would like her diazepam prescription has run out She will talk with primary care, she would like to take this sparingly    Orders Placed This Encounter  Procedures   EKG 12-Lead     Signed, Vanessa Herman, M.D., Ph.D. 08/07/2023  [facility] [place], [place] [phone]

## 2023-08-07 ENCOUNTER — Ambulatory Visit: Payer: Medicare Other | Attending: Cardiovascular Disease | Admitting: Cardiovascular Disease

## 2023-08-07 ENCOUNTER — Encounter: Payer: Self-pay | Admitting: Cardiovascular Disease

## 2023-08-07 VITALS — BP 140/80 | HR 75 | Ht <= 58 in | Wt 134.5 lb

## 2023-08-07 DIAGNOSIS — I700 Atherosclerosis of aorta: Secondary | ICD-10-CM | POA: Diagnosis not present

## 2023-08-07 DIAGNOSIS — E119 Type 2 diabetes mellitus without complications: Secondary | ICD-10-CM

## 2023-08-07 DIAGNOSIS — I2510 Atherosclerotic heart disease of native coronary artery without angina pectoris: Secondary | ICD-10-CM

## 2023-08-07 DIAGNOSIS — E782 Mixed hyperlipidemia: Secondary | ICD-10-CM | POA: Diagnosis present

## 2023-08-07 DIAGNOSIS — I10 Essential (primary) hypertension: Secondary | ICD-10-CM

## 2023-08-07 NOTE — Patient Instructions (Addendum)
Medication Instructions:  Restart simvastatin 10 mg daily  If you need a refill on your cardiac medications before your next appointment, please call your pharmacy.   Lab work: No new labs needed  Testing/Procedures: No new testing needed  Follow-Up: At [facility], you and your health needs are our priority.  As part of our continuing mission to provide you with exceptional heart care, we have created designated Provider Care Teams.  These Care Teams include your primary Cardiologist (physician) and Advanced Practice Providers (APPs -  Physician Assistants and Nurse Practitioners) who all work together to provide you with the care you need, when you need it.  You will need a follow up appointment in 12 months  Providers on your designated Care Team:   Nicolasa Ducking, NP Eula Listen, PA-C Cadence Fransico Michael, [place]  COVID-19 Vaccine Information can be found at: [email or web address] For questions related to vaccine distribution or appointments, please email [email or web address] or call [phone].

## 2023-09-27 ENCOUNTER — Other Ambulatory Visit: Payer: Self-pay

## 2023-09-27 MED ORDER — METFORMIN HCL 500 MG PO TABS: 500.0000 mg | ORAL_TABLET | Freq: Two times a day (BID) | ORAL | 2 refills | Status: DC

## 2023-12-04 ENCOUNTER — Other Ambulatory Visit: Payer: Self-pay

## 2023-12-04 DIAGNOSIS — Z87891 Personal history of nicotine dependence: Secondary | ICD-10-CM

## 2023-12-04 DIAGNOSIS — Z122 Encounter for screening for malignant neoplasm of respiratory organs: Secondary | ICD-10-CM

## 2023-12-18 LAB — HM DIABETES EYE EXAM

## 2024-01-08 ENCOUNTER — Other Ambulatory Visit (INDEPENDENT_AMBULATORY_CARE_PROVIDER_SITE_OTHER): Payer: Medicare Other

## 2024-01-08 DIAGNOSIS — E119 Type 2 diabetes mellitus without complications: Secondary | ICD-10-CM | POA: Diagnosis not present

## 2024-01-08 DIAGNOSIS — K760 Fatty (change of) liver, not elsewhere classified: Secondary | ICD-10-CM

## 2024-01-08 LAB — HEMOGLOBIN A1C: Hgb A1c MFr Bld: 6 % (ref 4.6–6.5)

## 2024-01-08 LAB — COMPREHENSIVE METABOLIC PANEL
ALT: 33 U/L (ref 0–35)
AST: 29 U/L (ref 0–37)
Albumin: 4.6 g/dL (ref 3.5–5.2)
Alkaline Phosphatase: 50 U/L (ref 39–117)
BUN: 13 mg/dL (ref 6–23)
CO2: 29 meq/L (ref 19–32)
Calcium: 9.6 mg/dL (ref 8.4–10.5)
Chloride: 98 meq/L (ref 96–112)
Creatinine, Ser: 0.73 mg/dL (ref 0.40–1.20)
GFR: 84.54 mL/min (ref >60.00)
Glucose, Bld: 101 mg/dL — ABNORMAL HIGH (ref 70–99)
Potassium: 4.5 meq/L (ref 3.5–5.1)
Sodium: 136 meq/L (ref 135–145)
Total Bilirubin: 0.5 mg/dL (ref 0.2–1.2)
Total Protein: 7 g/dL (ref 6.0–8.3)

## 2024-01-09 LAB — LIPID PANEL W/REFLEX DIRECT LDL
Cholesterol: 184 mg/dL (ref <200)
HDL: 64 mg/dL (ref >=50)
LDL Cholesterol (Calc): 98 mg/dL
Non-HDL Cholesterol (Calc): 120 mg/dL (ref <130)
Total CHOL/HDL Ratio: 2.9 (calc) (ref <5.0)
Triglycerides: 121 mg/dL (ref <150)

## 2024-01-10 ENCOUNTER — Encounter: Payer: Self-pay | Admitting: Internal Medicine

## 2024-01-10 ENCOUNTER — Ambulatory Visit: Payer: Medicare Other | Admitting: Internal Medicine

## 2024-01-10 VITALS — BP 130/78 | HR 70 | Ht <= 58 in | Wt 130.8 lb

## 2024-01-10 DIAGNOSIS — E119 Type 2 diabetes mellitus without complications: Secondary | ICD-10-CM | POA: Diagnosis not present

## 2024-01-10 DIAGNOSIS — G8929 Other chronic pain: Secondary | ICD-10-CM

## 2024-01-10 DIAGNOSIS — E785 Hyperlipidemia, unspecified: Secondary | ICD-10-CM

## 2024-01-10 DIAGNOSIS — K580 Irritable bowel syndrome with diarrhea: Secondary | ICD-10-CM

## 2024-01-10 DIAGNOSIS — E663 Overweight: Secondary | ICD-10-CM

## 2024-01-10 DIAGNOSIS — I10 Essential (primary) hypertension: Secondary | ICD-10-CM

## 2024-01-10 DIAGNOSIS — E782 Mixed hyperlipidemia: Secondary | ICD-10-CM | POA: Diagnosis not present

## 2024-01-10 DIAGNOSIS — K760 Fatty (change of) liver, not elsewhere classified: Secondary | ICD-10-CM

## 2024-01-10 DIAGNOSIS — M545 Low back pain, unspecified: Secondary | ICD-10-CM

## 2024-01-10 DIAGNOSIS — N950 Postmenopausal bleeding: Secondary | ICD-10-CM

## 2024-01-10 DIAGNOSIS — I152 Hypertension secondary to endocrine disorders: Secondary | ICD-10-CM

## 2024-01-10 DIAGNOSIS — E1159 Type 2 diabetes mellitus with other circulatory complications: Secondary | ICD-10-CM

## 2024-01-10 DIAGNOSIS — I700 Atherosclerosis of aorta: Secondary | ICD-10-CM

## 2024-01-10 DIAGNOSIS — L900 Lichen sclerosus et atrophicus: Secondary | ICD-10-CM

## 2024-01-10 DIAGNOSIS — M818 Other osteoporosis without current pathological fracture: Secondary | ICD-10-CM

## 2024-01-10 DIAGNOSIS — F419 Anxiety disorder, unspecified: Secondary | ICD-10-CM

## 2024-01-10 DIAGNOSIS — Z7984 Long term (current) use of oral hypoglycemic drugs: Secondary | ICD-10-CM

## 2024-01-10 DIAGNOSIS — K52831 Collagenous colitis: Secondary | ICD-10-CM

## 2024-01-10 DIAGNOSIS — E1169 Type 2 diabetes mellitus with other specified complication: Secondary | ICD-10-CM

## 2024-01-10 DIAGNOSIS — Z860100 Personal history of colon polyps, unspecified: Secondary | ICD-10-CM

## 2024-01-10 LAB — MICROALBUMIN / CREATININE URINE RATIO
Creatinine,U: 22.9 mg/dL
Microalb Creat Ratio: UNDETERMINED mg/g (ref 0.0–30.0)
Microalb, Ur: 0 mg/dL (ref 0.0–1.9)

## 2024-01-10 MED ORDER — DIAZEPAM 5 MG PO TABS: 5.0000 mg | ORAL_TABLET | Freq: Every day | ORAL | 2 refills | Status: AC | PRN

## 2024-01-10 NOTE — Assessment & Plan Note (Signed)
 Uses valium as needed,  less than once a week.  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  The risks and benefits of benzodiazepine use were reviewed with patient today including excessive sedation leading to respiratory depression,  impaired thinking/driving, and addiction.  Patient was advised to avoid concurrent use with alcohol, to use medication only as needed and not to share with others  .

## 2024-01-10 NOTE — Patient Instructions (Signed)
 In spite of the remarkable improvement in Your cholesterol ,  Statin therapy is now considered "standard of care" for primary prevention of heart disease in all patients who have a diagnosis of type 2 diabetes or aortic atherosclerosis .  I continue to recommend  trial of rosuvastatin  at a low dose (10 mg daily) , and will send it to your pharmacy if you are willing to give this medication a 90 day trial.  Please let me know and I will send it .    I'll see you in 6 months

## 2024-01-10 NOTE — Assessment & Plan Note (Signed)
Treated with budesonide for 5 years.  Currently resolved.

## 2024-01-10 NOTE — Assessment & Plan Note (Signed)
 Improving by 2024 DEXA , repeat in 2029.

## 2024-01-10 NOTE — Assessment & Plan Note (Signed)
 Reviewed findings with patient . She has been advised that high potency statins are recommended; I have recommended a trial of rosuvastatin bt she prefers to d/c ALL STATINS

## 2024-01-10 NOTE — Assessment & Plan Note (Signed)
 Non adenomatous.  10 yr follow up in 2033

## 2024-01-10 NOTE — Assessment & Plan Note (Addendum)
 Moderate to marked by CT Abdomen in 2021 .  Current liver enzymes are slightly elevated and she has been having RUQ pain.  U/S ordered  Lab Results  Component Value Date   ALT 39 (H) 07/10/2024   AST 38 (H) 07/10/2024   ALKPHOS 51 07/10/2024   BILITOT 0.7 07/10/2024

## 2024-01-10 NOTE — Assessment & Plan Note (Signed)
 With intermittent radiculitis of left lower extremity

## 2024-01-10 NOTE — Assessment & Plan Note (Deleted)
 Currently quiescent

## 2024-01-10 NOTE — Assessment & Plan Note (Addendum)
 Remains  well-controlled on metformin .  A1c has been consistently at or  less than 7.0.since 2021, Patient is up-to-date on eye exams .   She has stopped taking a statin and defers retarting .  She is taking  an ACE inhibitor.  Lab Results  Component Value Date   HGBA1C 6.0 01/08/2024   Lab Results  Component Value Date   LABMICR Comment 05/07/2018   MICROALBUR <0.7 12/28/2022   MICROALBUR <0.2 12/29/2021      Lab Results  Component Value Date   CHOL 184 01/08/2024   HDL 64 01/08/2024   LDLCALC 98 01/08/2024   LDLDIRECT 56.0 07/12/2023   TRIG 121 01/08/2024   CHOLHDL 2.9 01/08/2024

## 2024-01-10 NOTE — Assessment & Plan Note (Signed)
 Diagnosed by biopsy. ([place]) using clobetasol twice weekly for maintenance

## 2024-01-10 NOTE — Progress Notes (Addendum)
 Patient ID: Vanessa Herman, female    DOB: November 13, 1955  Age: 68 y.o. MRN: [id number]  The patient is here for follow up and management of other chronic and acute problems.   The risk factors are reflected in the social history.   The roster of all physicians providing medical care to patient - is listed in the Snapshot section of the chart.   Activities of daily living:  The patient is 100% independent in all ADLs: dressing, toileting, feeding as well as independent mobility   Home safety : The patient has smoke detectors in the home. They wear seatbelts.  There are no unsecured firearms at home. There is no violence in the home.    There is no risks for hepatitis, STDs or HIV. There is no   history of blood transfusion. They have no travel history to infectious disease endemic areas of the world.   The patient has seen their dentist in the last six month. They have seen their eye doctor in the last year. The patinet  denies slight hearing difficulty with regard to whispered voices and some television programs.  They have deferred audiologic testing in the last year.  They do not  have excessive sun exposure. Discussed the need for sun protection: hats, long sleeves and use of sunscreen if there is significant sun exposure.    Diet: the importance of a healthy diet is discussed. They do have a healthy diet.   The benefits of regular aerobic exercise were discussed. The patient  is not exercising due to caregiver burden.    Depression screen: there are no signs or vegative symptoms of depression- irritability, change in appetite, anhedonia, sadness/tearfullness.   The following portions of the patient's history were reviewed and updated as appropriate: allergies, current medications, past family history, past medical history,  past surgical history, past social history  and problem list.   Visual acuity was not assessed per patient preference since the patient has regular follow up with an   ophthalmologist. Hearing and body mass index were assessed and reviewed.    During the course of the visit the patient was educated and counseled about appropriate screening and preventive services including : fall prevention , diabetes screening, nutrition counseling, colorectal cancer screening, and recommended immunizations.    Chief Complaint:  1) weight loss intentional   2)  Diabetes with HLD:   She  feels generally well,  But is not  exercising regularly or trying to lose weight. Checking  blood sugars less than once daily at variable times, usually only if she feels she may be having a hypoglycemic event. .  BS have been under 130 fasting and < 150 post prandially.  Denies any recent hypoglyemic events.  Following a carbohydrate modified diet 6 days per week. Denies numbness, burning and tingling of extremities. Appetite is good.   has not taken a statin in a year +     Review of Symptoms  Patient denies headache, fevers, malaise, unintentional weight loss, skin rash, eye pain, sinus congestion and sinus pain, sore throat, dysphagia,  hemoptysis , cough, dyspnea, wheezing, chest pain, palpitations, orthopnea, edema, abdominal pain, nausea, melena, diarrhea, constipation, flank pain, dysuria, hematuria, urinary  Frequency, nocturia, numbness, tingling, seizures,  Focal weakness, Loss of consciousness,  Tremor, insomnia, depression, anxiety, and suicidal ideation.    Physical Exam:  BP 130/78   Pulse 70   Ht 4' 10 (1.473 m)   Wt 130 lb 12.8 oz (59.3 kg)  SpO2 97%   BMI 27.34 kg/m    Physical Exam Vitals reviewed.  Constitutional:      General: She is not in acute distress.    Appearance: Normal appearance. She is normal weight. She is not ill-appearing, toxic-appearing or diaphoretic.  HENT:     Head: Normocephalic.  Eyes:     General: No scleral icterus.       Right eye: No discharge.        Left eye: No discharge.     Conjunctiva/sclera: Conjunctivae normal.   Cardiovascular:     Rate and Rhythm: Normal rate and regular rhythm.     Heart sounds: Normal heart sounds.  Pulmonary:     Effort: Pulmonary effort is normal. No respiratory distress.     Breath sounds: Normal breath sounds.  Musculoskeletal:        General: Normal range of motion.  Skin:    General: Skin is warm and dry.  Neurological:     General: No focal deficit present.     Mental Status: She is alert and oriented to person, place, and time. Mental status is at baseline.  Psychiatric:        Mood and Affect: Mood normal.        Behavior: Behavior normal.        Thought Content: Thought content normal.        Judgment: Judgment normal.    Assessment and Plan: Essential hypertension Assessment & Plan: Well controlled on current regimen. Renal function stable, no changes today.   Lab Results  Component Value Date   CREATININE 0.73 01/08/2024   Lab Results  Component Value Date   NA 136 01/08/2024   K 4.5 01/08/2024   CL 98 01/08/2024   CO2 29 01/08/2024     Orders: -     Microalbumin / creatinine urine ratio -     Comprehensive metabolic panel with GFR; Future  Type 2 diabetes mellitus without complication, without long-term current use of insulin (HCC) Assessment & Plan: Remains  well-controlled on metformin  .  A1c has been consistently at or  less than 7.0.since 2021, Patient is up-to-date on eye exams .   She has stopped taking a statin and defers retarting .  She is taking  an ACE inhibitor.  Lab Results  Component Value Date   HGBA1C 6.0 01/08/2024   Lab Results  Component Value Date   LABMICR Comment 05/07/2018   MICROALBUR <0.7 12/28/2022   MICROALBUR <0.2 12/29/2021      Lab Results  Component Value Date   CHOL 184 01/08/2024   HDL 64 01/08/2024   LDLCALC 98 01/08/2024   LDLDIRECT 56.0 07/12/2023   TRIG 121 01/08/2024   CHOLHDL 2.9 01/08/2024      Orders: -     Microalbumin / creatinine urine ratio -     Comprehensive metabolic panel  with GFR; Future -     Hemoglobin A1c; Future  Mixed hyperlipidemia Assessment & Plan: LDL is not at goal since stopping atorvastatin .   advised to consider  Zetia  Lab Results  Component Value Date   CHOL 184 01/08/2024   HDL 64 01/08/2024   LDLCALC 98 01/08/2024   LDLDIRECT 56.0 07/12/2023   TRIG 121 01/08/2024   CHOLHDL 2.9 01/08/2024     Orders: -     Lipid panel; Future -     LDL cholesterol, direct; Future -     TSH; Future  Overweight (BMI 25.0-29.9)  Anxiety Assessment & Plan:  Uses valium  as needed,  less than once a week.  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  The risks and benefits of benzodiazepine use were reviewed with patient today including excessive sedation leading to respiratory depression,  impaired thinking/driving, and addiction.  Patient was advised to avoid concurrent use with alcohol, to use medication only as needed and not to share with others  .    Orders: -     diazePAM ; Take 1 tablet (5 mg total) by mouth daily as needed for anxiety.  Dispense: 30 tablet; Refill: 2  Fatty liver Assessment & Plan: Moderate to marked by CT Abdomen in 2021 .  Current liver enzymes are slightly elevated and she has been having RUQ pain.  U/S ordered  Lab Results  Component Value Date   ALT 39 (H) 07/10/2024   AST 38 (H) 07/10/2024   ALKPHOS 51 07/10/2024   BILITOT 0.7 07/10/2024      Lichen sclerosus et atrophicus Assessment & Plan: Diagnosed by biopsy. ([place]) using clobetasol twice weekly for maintenance    Irritable bowel syndrome with diarrhea  Collagenous colitis Assessment & Plan: Treated with budesonide  for 5 years.  Currently resolved.   History of colon polyps Assessment & Plan: Non adenomatous.  10 yr follow up in 2033    Aortic atherosclerosis Assessment & Plan: Reviewed findings with patient . She has been advised that high potency statins are recommended; I have recommended  a trial of rosuvastatin  bt she prefers to d/c ALL STATINS    Chronic bilateral low back pain without sciatica Assessment & Plan: With intermittent radiculitis of left lower extremity    Other osteoporosis without current pathological fracture Assessment & Plan: Improving by 2024 DEXA , repeat in 2029.     Postmenopausal bleeding Assessment & Plan: Continue annual follow up with Dr Verdon    Hypertension associated with diabetes [facility]) Assessment & Plan: she reports compliance with medication regimen  of metoprolol  and lisinopril .   but has an elevated reading today in office.  Her home readings are lower  Lab Results  Component Value Date   LABMICR Comment 05/07/2018   MICROALBUR 0.0 01/10/2024   MICROALBUR <0.2 12/29/2021     Lab Results  Component Value Date   NA 137 07/10/2024   K 4.3 07/10/2024   CL 99 07/10/2024   CO2 27 07/10/2024   Lab Results  Component Value Date   CREATININE 0.70 07/10/2024      Hyperlipidemia associated with type 2 diabetes mellitus (HCC) Assessment & Plan: LDL is not at goal since stopping atorvastatin .   advised to consider  Zetia  Lab Results  Component Value Date   CHOL 184 01/08/2024   HDL 64 01/08/2024   LDLCALC 98 01/08/2024   LDLDIRECT 56.0 07/12/2023   TRIG 121 01/08/2024   CHOLHDL 2.9 01/08/2024        No follow-ups on file.  Vanessa LITTIE Kettering, MD

## 2024-01-11 ENCOUNTER — Telehealth: Payer: Self-pay

## 2024-01-11 ENCOUNTER — Other Ambulatory Visit (HOSPITAL_COMMUNITY): Payer: Self-pay

## 2024-01-11 NOTE — Assessment & Plan Note (Addendum)
 LDL is not at goal since stopping atorvastatin .   advised to consider  Zetia  Lab Results  Component Value Date   CHOL 184 01/08/2024   HDL 64 01/08/2024   LDLCALC 98 01/08/2024   LDLDIRECT 56.0 07/12/2023   TRIG 121 01/08/2024   CHOLHDL 2.9 01/08/2024

## 2024-01-11 NOTE — Assessment & Plan Note (Signed)
Continue annual follow up with Dr Leafy Ro

## 2024-01-11 NOTE — Telephone Encounter (Signed)
 Pharmacy Patient Advocate Encounter   Received notification from CoverMyMeds that prior authorization for diazePAM 5MG  tablets is required/requested.   Insurance verification completed.   The patient is insured through [facility] .   Per test claim: PA required; PA started via CoverMyMeds. KEY [id number] . Waiting for clinical questions to populate. HAS BEEN SENT TO PLAN

## 2024-01-11 NOTE — Assessment & Plan Note (Signed)
 Well controlled on current regimen. Renal function stable, no changes today.   Lab Results  Component Value Date   CREATININE 0.73 01/08/2024   Lab Results  Component Value Date   NA 136 01/08/2024   K 4.5 01/08/2024   CL 98 01/08/2024   CO2 29 01/08/2024

## 2024-01-14 ENCOUNTER — Other Ambulatory Visit (HOSPITAL_COMMUNITY): Payer: Self-pay

## 2024-01-14 NOTE — Telephone Encounter (Signed)
 Pharmacy Patient Advocate Encounter  Received notification from [facility] that Prior Authorization for diazePAM 5MG  tablets has been APPROVED from 12/28/23 to 05/10/24. Ran test claim, Copay is $2.57. This test claim was processed through [facility]- copay amounts may vary at other pharmacies due to pharmacy/plan contracts, or as the patient moves through the different stages of their insurance plan.   PA #/Case ID/Reference #: [id number]

## 2024-01-15 ENCOUNTER — Ambulatory Visit
Admission: RE | Admit: 2024-01-15 | Discharge: 2024-01-15 | Disposition: A | Discharge status: Home / Self Care | Payer: Medicare Other | Source: Ambulatory Visit | Attending: Acute Care | Admitting: Acute Care

## 2024-01-15 DIAGNOSIS — Z122 Encounter for screening for malignant neoplasm of respiratory organs: Secondary | ICD-10-CM | POA: Insufficient documentation

## 2024-01-15 DIAGNOSIS — Z87891 Personal history of nicotine dependence: Secondary | ICD-10-CM | POA: Insufficient documentation

## 2024-02-18 ENCOUNTER — Other Ambulatory Visit: Payer: Self-pay | Admitting: Acute Care

## 2024-02-18 DIAGNOSIS — Z87891 Personal history of nicotine dependence: Secondary | ICD-10-CM

## 2024-02-18 DIAGNOSIS — Z122 Encounter for screening for malignant neoplasm of respiratory organs: Secondary | ICD-10-CM

## 2024-02-25 ENCOUNTER — Encounter: Payer: Self-pay | Admitting: Internal Medicine

## 2024-02-25 DIAGNOSIS — M5416 Radiculopathy, lumbar region: Secondary | ICD-10-CM

## 2024-03-04 ENCOUNTER — Ambulatory Visit: Attending: Internal Medicine

## 2024-03-04 DIAGNOSIS — M6281 Muscle weakness (generalized): Secondary | ICD-10-CM | POA: Diagnosis present

## 2024-03-04 DIAGNOSIS — G5702 Lesion of sciatic nerve, left lower limb: Secondary | ICD-10-CM | POA: Insufficient documentation

## 2024-03-04 DIAGNOSIS — M545 Low back pain, unspecified: Secondary | ICD-10-CM | POA: Diagnosis present

## 2024-03-04 DIAGNOSIS — M5416 Radiculopathy, lumbar region: Secondary | ICD-10-CM | POA: Diagnosis not present

## 2024-03-04 NOTE — Therapy (Signed)
 OUTPATIENT PHYSICAL THERAPY THORACOLUMBAR EVALUATION   Patient Name: Vanessa Herman MRN: [id number] DOB:[birth year], 68 y.o., female Today's Date: 03/04/2024  END OF SESSION:  PT End of Session - 03/04/24 0946     Visit Number 1    Number of Visits 16    Date for PT Re-Evaluation 04/29/24    Progress Note Due on Visit 10    PT Start Time 0931    PT Stop Time 1015    PT Time Calculation (min) 44 min    Activity Tolerance Patient tolerated treatment well    Behavior During Therapy [facility] for tasks assessed/performed             Past Medical History:  Diagnosis Date   Allergy ?   seasonal   Anxiety    occasional   Chicken pox    Collagenous colitis    Dr. Ole Berkeley    Colon polyps    COVID-19    11/02/20 from husband pnd + with sxs   DDD (degenerative disc disease), cervical    had PT in the past, noted imaging 05/31/10    Depression    Diabetes mellitus without complication (HCC)    Type 2   Fatty liver    Ganglion cyst of wrist, left    Hemorrhoids    Hemorrhoids    History of colon polyps    History of prediabetes    Hyperlipidemia 07/06/2015   controlled by meds   Hypertension    controlled by meds   IBS (irritable bowel syndrome) 07/06/2015   Lichen sclerosus    NS (neck stiffness)    xray shows something at "C3" approx 4 yrs ago. Had PT. Helped.   Polycythemia, secondary 07/06/2015   Prediabetes    SCC (squamous cell carcinoma)    scalp s/p mohs 11/07/2018 [facility] Dr. Robert Chimes   Urinary incontinence    Vertigo    occasional   Vitamin D  deficiency 07/06/2015   Past Surgical History:  Procedure Laterality Date   BREAST BIOPSY Left 06/30/2021   US  bx, coil marker, BENIGN MAMMARY PARENCHYMA WITH MILD STROMAL FIBROSIS. - NEGATIVE FOR ATYPICAL PROLIFERATIVE   COLONOSCOPY  2006   COLONOSCOPY WITH PROPOFOL  N/A 08/12/2015   Procedure: COLONOSCOPY WITH PROPOFOL ;  Surgeon: Marnee Sink, MD;  Location: [facility];  Service: Endoscopy;  Laterality: N/A;  PT WOULD  LIKE EARLY AM PER JS   COLONOSCOPY WITH PROPOFOL  N/A 09/15/2022   Procedure: COLONOSCOPY WITH PROPOFOL ;  Surgeon: Marnee Sink, MD;  Location: [facility];  Service: Endoscopy;  Laterality: N/A;   EYE SURGERY  05/17/2020  &  06/07/2020   right eye 05/17/20 and 06/07/20 Dr. Chip Coulter my eye MD f/u 06/2020 cataract surgery narrow angles   HYSTEROSALPINGOGRAM     1983   TONSILLECTOMY AND ADENOIDECTOMY     age [age]   Patient Active Problem List   Diagnosis Date Noted   Lichen sclerosus et atrophicus 01/10/2024   Rash and nonspecific skin eruption 05/23/2023   Dermatitis 05/22/2023   Dysphagia 12/29/2021   Diverticulosis 06/02/2021   Vaginal atrophy 05/25/2021   Postmenopausal bleeding 03/29/2021   Hypertension associated with diabetes (HCC) 12/21/2020   Overweight (BMI 25.0-29.9) 06/23/2020   Chronic pain of right knee 12/31/2019   Anxiety 12/31/2019   Mitral annular calcification 07/18/2019   SCC (squamous cell carcinoma) 11/10/2018   Aortic atherosclerosis (HCC) 08/05/2018   Coronary artery calcification seen on CAT scan 08/05/2018   Annual physical exam 07/30/2018   Palpitations 05/21/2018  Chronic bilateral low back pain without sciatica 05/21/2018   FH: cardiovascular disease 05/21/2018   Cigarette nicotine dependence in remission 05/21/2018   Essential hypertension 05/13/2018   Fatty liver 05/13/2018   Night sweats 05/13/2018   Osteoporosis 05/13/2018   Type 2 diabetes mellitus (HCC) 05/07/2018   History of colon polyps 05/07/2018   Nonspecific chest pain 05/07/2018   Urinary incontinence 05/07/2018   Collagenous colitis 11/27/2017   Special screening for malignant neoplasms, colon    Hyperlipidemia 07/06/2015   Polycythemia, secondary 07/06/2015    PCP: Thersia Flax, MD   REFERRING PROVIDER: Thersia Flax, MD   REFERRING DIAG: M54.16 (ICD-10-CM) - Acute left lumbar radiculopathy   Rationale for Evaluation and Treatment: Rehabilitation  THERAPY DIAG:   Lumbar pain  Piriformis syndrome, left  Muscle weakness (generalized)  ONSET DATE: 02/13/2021  SUBJECTIVE:                                                                                                                                                                                           SUBJECTIVE STATEMENT:  Pt reports she has had some lumbar back pain for 3 years, but 3 weeks ago, it was really tender and sore in the piriformis region.  Pt notes that she has been taking 4 ibuprofen and using a lidocaine  patch to help with the pain.  Pt feels that moving around and keeping her mind off of it helps.  Pt notes that the pain is usually worse when sitting down.   PERTINENT HISTORY:   Pt has PMH: anxiety, DDD, depression, DM2, fatty liver, L ganglion cyst, hyperlipidemia, HTN, IBS, urinary incontinence, and vertigo.  PAIN:  Are you having pain? Yes: NPRS scale: 2/10 Pain location: L buttocks/piriformis Pain description: Dull Achey Aggravating factors: Sitting Relieving factors: Medicine, lidocaine  patch  PRECAUTIONS: None  RED FLAGS: None   WEIGHT BEARING RESTRICTIONS: No  FALLS:  Has patient fallen in last 6 months? No  LIVING ENVIRONMENT: Lives with: lives with their spouse Lives in: House/apartment Stairs: Yes: Internal: 13 steps; on left going up and External: 3 steps; none Has following equipment at home: Single point cane, Walker - 2 wheeled, Wheelchair (manual), shower chair, and bed side commode  OCCUPATION: Retired, but takes care of her husband and she takes care of the 3 acres that she lives on.  PLOF: Independent  PATIENT GOALS: "I just want out of pain and I want to know why I have the numbness in the leg.  I also want to know strategies to prevent or resolve this issue in the future."  NEXT MD VISIT: September  OBJECTIVE:  Note:  Objective measures were completed at Evaluation unless otherwise noted.  DIAGNOSTIC FINDINGS:  None  PATIENT SURVEYS:   LEFS 90%  COGNITION: Overall cognitive status: Within functional limits for tasks assessed     SENSATION: WFL   PALPATION: Pt has tightness in the piriformis region and ITB as well.    LUMBAR ROM:   AROM eval  Flexion 70%  Extension 65%  Right lateral flexion 75%  Left lateral flexion 75%  Right rotation 80%  Left rotation 80%   (Blank rows = not tested)  LOWER EXTREMITY ROM:  WFL  LOWER EXTREMITY MMT:    MMT Right eval Left eval  Hip flexion 4 4  Hip extension 4- 4-  Hip abduction 3+ 3+  Hip adduction 3+ 3+  Knee flexion 4 4  Knee extension 4 4   (Blank rows = not tested)  LUMBAR SPECIAL TESTS:  Straight leg raise test: Negative and Slump test: Positive on L side  FUNCTIONAL TESTS:  Not performed    TREATMENT DATE: 03/04/24                                                                                                                                TherEx: To improve strength, endurance, mobility, and function of specific targeted muscle groups or improve joint range of motion or improve muscle flexibility  Created and reviewed HEP in order to establish exercises that pt can utilize to increase glute strength and improve piriformis tightness.   PATIENT EDUCATION:  Education details: Pt educated on role of PT and services provided during current POC, along with prognosis and information about the clinic.  Person educated: Patient Education method: Explanation, Demonstration, Tactile cues, Verbal cues, and Handouts Education comprehension: verbalized understanding, returned demonstration, verbal cues required, and tactile cues required  HOME EXERCISE PROGRAM:   Access Code: [email or web address] URL: [email or web address] Date: 03/04/2024 Prepared by: Dawson Europe  Exercises - Supine Bridge  - 1 x daily - 7 x weekly - 3 sets - 10 reps - Clamshell  - 1 x daily - 7 x weekly - 3 sets - 10 reps - Supine Piriformis Stretch with Foot on Ground  -  2 x daily - 7 x weekly - 1 sets - 2 reps - 30 hold - Supine Posterior Pelvic Tilt  - 1 x daily - 7 x weekly - 3 sets - 10 reps - Supine Hip Adduction Isometric with Ball  - 1 x daily - 7 x weekly - 3 sets - 10 reps - Supine Lower Trunk Rotation  - 1 x daily - 7 x weekly - 3 sets - 10 reps  ASSESSMENT:  CLINICAL IMPRESSION: Patient is a 68 y.o. female who was seen today for physical therapy evaluation and treatment for low back pain with radicular symptoms going to the L LE.  Pt has signs and symptoms that are consistent with piriformis syndrome.  Pt does have lack of mobility in the lumbar  spine as well, however most of the pain is centralized in the piriformis/glute region of the L buttocks.  Pt also has weakness in the LE's specifically with hip musculature and would benefit from skilled therapy to address those deficits moving forward.  Pt. demonstrates understanding of this plan of care and agrees with this plan.     OBJECTIVE IMPAIRMENTS: decreased endurance, decreased strength, hypomobility, and pain.   ACTIVITY LIMITATIONS: sitting, standing, and squatting  PARTICIPATION LIMITATIONS: meal prep, cleaning, laundry, driving, shopping, community activity, and yard work  PERSONAL FACTORS: Past/current experiences, Time since onset of injury/illness/exacerbation, and 3+ comorbidities: anxiety, DDD, vertigo are also affecting patient's functional outcome.  REHAB POTENTIAL: Good  CLINICAL DECISION MAKING: Stable/uncomplicated  EVALUATION COMPLEXITY: Low   GOALS: Goals reviewed with patient? Yes  SHORT TERM GOALS: Target date: 04/01/2024  Pt will be independent with HEP in order to demonstrate increased ability to perform tasks related to occupation/hobbies. Baseline:  Pt given HEP at baseline. Goal status: INITIAL   LONG TERM GOALS: Target date: 04/29/2024  1.  Patient will improve the LEFS by 10% in order to demonstrate functional independence without pain. Baseline: 90% at  baseline Goal status: INITIAL  2.  Pt will reduce overall pain level to 0/10 by utilizing a combination of stretching, strengthening exercises, and pain-reducing modalities in order to improve overall QoL. Baseline: Pt 2/10 pain level at rest. Goal status: INITIAL  3.  Pt will improve MMT of hip abduction to be 4+/5 bilaterally to show an improvement of overall hip strength necessary for balance and stability when ambulating. Baseline: 3+/5 bilaterally Goal status: INITIAL  PLAN:  PT FREQUENCY: 1-2x/week  PT DURATION: 8 weeks  PLANNED INTERVENTIONS: 97750- Physical Performance Testing, 97110-Therapeutic exercises, 97530- Therapeutic activity, 97112- Neuromuscular re-education, 97535- Self Care, 28413- Manual therapy, 97116- Gait training, Dry Needling, Joint mobilization, Joint manipulation, Spinal manipulation, and Spinal mobilization.  PLAN FOR NEXT SESSION:   Manual therapy of the gluteal region  Continued assessment of the lower back to rule out lumbar radiculopathy    Rozanna Corner, PT, DPT Physical Therapist - [facility]  03/04/24, 2:50 PM

## 2024-03-06 ENCOUNTER — Ambulatory Visit

## 2024-03-06 DIAGNOSIS — M6281 Muscle weakness (generalized): Secondary | ICD-10-CM

## 2024-03-06 DIAGNOSIS — M545 Low back pain, unspecified: Secondary | ICD-10-CM | POA: Diagnosis not present

## 2024-03-06 DIAGNOSIS — G5702 Lesion of sciatic nerve, left lower limb: Secondary | ICD-10-CM

## 2024-03-06 NOTE — Therapy (Signed)
 OUTPATIENT PHYSICAL THERAPY THORACOLUMBAR TREATMENT   Patient Name: Vanessa Herman MRN: [id number] DOB:1956-05-15, 68 y.o., female Today's Date: 03/06/2024  END OF SESSION:  PT End of Session - 03/06/24 0272     Visit Number 2    Number of Visits 16    Date for PT Re-Evaluation 04/29/24    Progress Note Due on Visit 10    PT Start Time 0925    PT Stop Time 1005    PT Time Calculation (min) 40 min    Activity Tolerance Patient tolerated treatment well    Behavior During Therapy [facility] for tasks assessed/performed             Past Medical History:  Diagnosis Date   Allergy ?   seasonal   Anxiety    occasional   Chicken pox    Collagenous colitis    Dr. Ole Berkeley    Colon polyps    COVID-19    11/02/20 from husband pnd + with sxs   DDD (degenerative disc disease), cervical    had PT in the past, noted imaging 05/31/10    Depression    Diabetes mellitus without complication (HCC)    Type 2   Fatty liver    Ganglion cyst of wrist, left    Hemorrhoids    Hemorrhoids    History of colon polyps    History of prediabetes    Hyperlipidemia 07/06/2015   controlled by meds   Hypertension    controlled by meds   IBS (irritable bowel syndrome) 07/06/2015   Lichen sclerosus    NS (neck stiffness)    xray shows something at "C3" approx 4 yrs ago. Had PT. Helped.   Polycythemia, secondary 07/06/2015   Prediabetes    SCC (squamous cell carcinoma)    scalp s/p mohs 11/07/2018 [facility] Dr. Robert Chimes   Urinary incontinence    Vertigo    occasional   Vitamin D  deficiency 07/06/2015   Past Surgical History:  Procedure Laterality Date   BREAST BIOPSY Left 06/30/2021   US  bx, coil marker, BENIGN MAMMARY PARENCHYMA WITH MILD STROMAL FIBROSIS. - NEGATIVE FOR ATYPICAL PROLIFERATIVE   COLONOSCOPY  2006   COLONOSCOPY WITH PROPOFOL  N/A 08/12/2015   Procedure: COLONOSCOPY WITH PROPOFOL ;  Surgeon: Marnee Sink, MD;  Location: [facility];  Service: Endoscopy;  Laterality: N/A;  PT WOULD  LIKE EARLY AM PER JS   COLONOSCOPY WITH PROPOFOL  N/A 09/15/2022   Procedure: COLONOSCOPY WITH PROPOFOL ;  Surgeon: Marnee Sink, MD;  Location: [facility];  Service: Endoscopy;  Laterality: N/A;   EYE SURGERY  05/17/2020  &  06/07/2020   right eye 05/17/20 and 06/07/20 Dr. Chip Coulter my eye MD f/u 06/2020 cataract surgery narrow angles   HYSTEROSALPINGOGRAM     1983   TONSILLECTOMY AND ADENOIDECTOMY     age [age]   Patient Active Problem List   Diagnosis Date Noted   Lichen sclerosus et atrophicus 01/10/2024   Rash and nonspecific skin eruption 05/23/2023   Dermatitis 05/22/2023   Dysphagia 12/29/2021   Diverticulosis 06/02/2021   Vaginal atrophy 05/25/2021   Postmenopausal bleeding 03/29/2021   Hypertension associated with diabetes (HCC) 12/21/2020   Overweight (BMI 25.0-29.9) 06/23/2020   Chronic pain of right knee 12/31/2019   Anxiety 12/31/2019   Mitral annular calcification 07/18/2019   SCC (squamous cell carcinoma) 11/10/2018   Aortic atherosclerosis (HCC) 08/05/2018   Coronary artery calcification seen on CAT scan 08/05/2018   Annual physical exam 07/30/2018   Palpitations 05/21/2018  Chronic bilateral low back pain without sciatica 05/21/2018   FH: cardiovascular disease 05/21/2018   Cigarette nicotine dependence in remission 05/21/2018   Essential hypertension 05/13/2018   Fatty liver 05/13/2018   Night sweats 05/13/2018   Osteoporosis 05/13/2018   Type 2 diabetes mellitus (HCC) 05/07/2018   History of colon polyps 05/07/2018   Nonspecific chest pain 05/07/2018   Urinary incontinence 05/07/2018   Collagenous colitis 11/27/2017   Special screening for malignant neoplasms, colon    Hyperlipidemia 07/06/2015   Polycythemia, secondary 07/06/2015    PCP: Thersia Flax, MD   REFERRING PROVIDER: Thersia Flax, MD   REFERRING DIAG: M54.16 (ICD-10-CM) - Acute left lumbar radiculopathy   Rationale for Evaluation and Treatment: Rehabilitation  THERAPY DIAG:   Lumbar pain  Piriformis syndrome, left  Muscle weakness (generalized)  ONSET DATE: 02/13/2021  SUBJECTIVE:                                                                                                                                                                                           SUBJECTIVE STATEMENT:  Pt reports 4/10 pain upon arrival.  Pt reports she did take ibuprofen this morning, but she has been up since 3:00 AM just unable to sleep.    PERTINENT HISTORY:   Pt has PMH: anxiety, DDD, depression, DM2, fatty liver, L ganglion cyst, hyperlipidemia, HTN, IBS, urinary incontinence, and vertigo.  PAIN:  Are you having pain? Yes: NPRS scale: 2/10 Pain location: L buttocks/piriformis Pain description: Dull Achey Aggravating factors: Sitting Relieving factors: Medicine, lidocaine  patch  PRECAUTIONS: None  RED FLAGS: None   WEIGHT BEARING RESTRICTIONS: No  FALLS:  Has patient fallen in last 6 months? No  LIVING ENVIRONMENT: Lives with: lives with their spouse Lives in: House/apartment Stairs: Yes: Internal: 13 steps; on left going up and External: 3 steps; none Has following equipment at home: Single point cane, Walker - 2 wheeled, Wheelchair (manual), shower chair, and bed side commode  OCCUPATION: Retired, but takes care of her husband and she takes care of the 3 acres that she lives on.  PLOF: Independent  PATIENT GOALS: "I just want out of pain and I want to know why I have the numbness in the leg.  I also want to know strategies to prevent or resolve this issue in the future."  NEXT MD VISIT: September  OBJECTIVE:  Note: Objective measures were completed at Evaluation unless otherwise noted.  DIAGNOSTIC FINDINGS:  None  PATIENT SURVEYS:  LEFS 90%  COGNITION: Overall cognitive status: Within functional limits for tasks assessed     SENSATION: WFL   PALPATION: Pt has tightness in  the piriformis region and ITB as well.    LUMBAR ROM:    AROM eval  Flexion 70%  Extension 65%  Right lateral flexion 75%  Left lateral flexion 75%  Right rotation 80%  Left rotation 80%   (Blank rows = not tested)  LOWER EXTREMITY ROM:  WFL  LOWER EXTREMITY MMT:    MMT Right eval Left eval  Hip flexion 4 4  Hip extension 4- 4-  Hip abduction 3+ 3+  Hip adduction 3+ 3+  Knee flexion 4 4  Knee extension 4 4   (Blank rows = not tested)  LUMBAR SPECIAL TESTS:  Straight leg raise test: Negative and Slump test: Positive on L side  FUNCTIONAL TESTS:  Not performed    TREATMENT DATE: 03/06/24   TherEx:   Hooklying LTR, 2x10 each direction Hooklying glute bridge, 2x10 Hooklying PPT, 2x10 Hooklying Figure 4 stretch, 30 sec bouts Hooklying piriformis stretch, 30 sec bouts Supine ITB stretch, 30 sec bouts Prone hip extension with bent knee, x10 each LE Prone hip extension straight leg, x10 each LE   Manual:  Prone unilateral/central PA's to lumbar spinous processes for improved lumbar mobility, 30 sec bouts at each level Prone STM to gluteal region for improved pain and tissue extensibility, extensive time spent her bilaterally Prone pin pressure with elbow while LE was moved into passive IE/ER to improve tissue extensibility, bilaterally   May benefit from needling at the subsequent sessions if possible.    MHP applied to L gluteal region while manual therapy was performed to the R side.   PATIENT EDUCATION:  Education details: Pt educated on role of PT and services provided during current POC, along with prognosis and information about the clinic.  Person educated: Patient Education method: Explanation, Demonstration, Tactile cues, Verbal cues, and Handouts Education comprehension: verbalized understanding, returned demonstration, verbal cues required, and tactile cues required  HOME EXERCISE PROGRAM:   Access Code: [email or web address] URL: [email or web address] Date: 03/04/2024 Prepared by: Dawson Europe  Exercises - Supine Bridge  - 1 x daily - 7 x weekly - 3 sets - 10 reps - Clamshell  - 1 x daily - 7 x weekly - 3 sets - 10 reps - Supine Piriformis Stretch with Foot on Ground  - 2 x daily - 7 x weekly - 1 sets - 2 reps - 30 hold - Supine Posterior Pelvic Tilt  - 1 x daily - 7 x weekly - 3 sets - 10 reps - Supine Hip Adduction Isometric with Ball  - 1 x daily - 7 x weekly - 3 sets - 10 reps - Supine Lower Trunk Rotation  - 1 x daily - 7 x weekly - 3 sets - 10 reps  ASSESSMENT:  CLINICAL IMPRESSION:  Pt responded favorably to the treatment approach, and would likely benefit from needling of the piriformis region to reduce pain and improve the overall tissue extensibility.  Pt is open to having dry needling at the next few sessions if possible.  Pt encouraged to continue to perform exercises at home to improve overall mobility of the hip and reduce the pain in the gluteal/piriformis region.   Pt will continue to benefit from skilled therapy to address remaining deficits in order to improve overall QoL and return to PLOF.        OBJECTIVE IMPAIRMENTS: decreased endurance, decreased strength, hypomobility, and pain.   ACTIVITY LIMITATIONS: sitting, standing, and squatting  PARTICIPATION LIMITATIONS: meal prep, cleaning, laundry, driving, shopping, community activity, and yard work  PERSONAL FACTORS: Past/current experiences, Time since onset of injury/illness/exacerbation, and 3+ comorbidities: anxiety, DDD, vertigo are also affecting patient's functional outcome.  REHAB POTENTIAL: Good  CLINICAL DECISION MAKING: Stable/uncomplicated  EVALUATION COMPLEXITY: Low   GOALS: Goals reviewed with patient? Yes  SHORT TERM GOALS: Target date: 04/01/2024  Pt will be independent with HEP in order to demonstrate increased ability to perform tasks related to occupation/hobbies. Baseline:  Pt given HEP at baseline. Goal status: INITIAL   LONG TERM GOALS: Target date: 04/29/2024  1.   Patient will improve the LEFS by 10% in order to demonstrate functional independence without pain. Baseline: 90% at baseline Goal status: INITIAL  2.  Pt will reduce overall pain level to 0/10 by utilizing a combination of stretching, strengthening exercises, and pain-reducing modalities in order to improve overall QoL. Baseline: Pt 2/10 pain level at rest. Goal status: INITIAL  3.  Pt will improve MMT of hip abduction to be 4+/5 bilaterally to show an improvement of overall hip strength necessary for balance and stability when ambulating. Baseline: 3+/5 bilaterally Goal status: INITIAL  PLAN:  PT FREQUENCY: 1-2x/week  PT DURATION: 8 weeks  PLANNED INTERVENTIONS: 97750- Physical Performance Testing, 97110-Therapeutic exercises, 97530- Therapeutic activity, 97112- Neuromuscular re-education, 97535- Self Care, [id number]- Manual therapy, 97116- Gait training, Dry Needling, Joint mobilization, Joint manipulation, Spinal manipulation, and Spinal mobilization.  PLAN FOR NEXT SESSION:   Manual therapy of the gluteal region  Continued assessment of the lower back to rule out lumbar radiculopathy    Rozanna Corner, PT, DPT Physical Therapist - [facility]  03/06/24, 9:22 AM

## 2024-03-12 ENCOUNTER — Encounter

## 2024-03-13 ENCOUNTER — Ambulatory Visit: Admitting: Physical Therapy

## 2024-03-13 DIAGNOSIS — G5702 Lesion of sciatic nerve, left lower limb: Secondary | ICD-10-CM

## 2024-03-13 DIAGNOSIS — M545 Low back pain, unspecified: Secondary | ICD-10-CM

## 2024-03-13 DIAGNOSIS — M6281 Muscle weakness (generalized): Secondary | ICD-10-CM

## 2024-03-13 NOTE — Therapy (Signed)
 OUTPATIENT PHYSICAL THERAPY THORACOLUMBAR TREATMENT   Patient Name: Vanessa Herman MRN: [id number] DOB:[birth year], 67 y.o., female Today's Date: 03/13/2024  END OF SESSION:   PT End of Session - 03/13/24 0853     Visit Number 3    Number of Visits 16    Date for PT Re-Evaluation 04/29/24    Progress Note Due on Visit 10    PT Start Time 0853    PT Stop Time 0935    PT Time Calculation (min) 42 min    Activity Tolerance Patient tolerated treatment well    Behavior During Therapy [facility] for tasks assessed/performed              Past Medical History:  Diagnosis Date   Allergy ?   seasonal   Anxiety    occasional   Chicken pox    Collagenous colitis    Dr. Ole Berkeley    Colon polyps    COVID-19    11/02/20 from husband pnd + with sxs   DDD (degenerative disc disease), cervical    had PT in the past, noted imaging 05/31/10    Depression    Diabetes mellitus without complication (HCC)    Type 2   Fatty liver    Ganglion cyst of wrist, left    Hemorrhoids    Hemorrhoids    History of colon polyps    History of prediabetes    Hyperlipidemia 07/06/2015   controlled by meds   Hypertension    controlled by meds   IBS (irritable bowel syndrome) 07/06/2015   Lichen sclerosus    NS (neck stiffness)    xray shows something at "C3" approx 4 yrs ago. Had PT. Helped.   Polycythemia, secondary 07/06/2015   Prediabetes    SCC (squamous cell carcinoma)    scalp s/p mohs 11/07/2018 unc Dr. Robert Chimes   Urinary incontinence    Vertigo    occasional   Vitamin D  deficiency 07/06/2015   Past Surgical History:  Procedure Laterality Date   BREAST BIOPSY Left 06/30/2021   US  bx, coil marker, BENIGN MAMMARY PARENCHYMA WITH MILD STROMAL FIBROSIS. - NEGATIVE FOR ATYPICAL PROLIFERATIVE   COLONOSCOPY  2006   COLONOSCOPY WITH PROPOFOL  N/A 08/12/2015   Procedure: COLONOSCOPY WITH PROPOFOL ;  Surgeon: Marnee Sink, MD;  Location: [facility];  Service: Endoscopy;  Laterality: N/A;  PT  WOULD LIKE EARLY AM PER JS   COLONOSCOPY WITH PROPOFOL  N/A 09/15/2022   Procedure: COLONOSCOPY WITH PROPOFOL ;  Surgeon: Marnee Sink, MD;  Location: [facility];  Service: Endoscopy;  Laterality: N/A;   EYE SURGERY  05/17/2020  &  06/07/2020   right eye 05/17/20 and 06/07/20 Dr. Chip Coulter my eye MD f/u 06/2020 cataract surgery narrow angles   HYSTEROSALPINGOGRAM     1983   TONSILLECTOMY AND ADENOIDECTOMY     age [age]   Patient Active Problem List   Diagnosis Date Noted   Lichen sclerosus et atrophicus 01/10/2024   Rash and nonspecific skin eruption 05/23/2023   Dermatitis 05/22/2023   Dysphagia 12/29/2021   Diverticulosis 06/02/2021   Vaginal atrophy 05/25/2021   Postmenopausal bleeding 03/29/2021   Hypertension associated with diabetes (HCC) 12/21/2020   Overweight (BMI 25.0-29.9) 06/23/2020   Chronic pain of right knee 12/31/2019   Anxiety 12/31/2019   Mitral annular calcification 07/18/2019   SCC (squamous cell carcinoma) 11/10/2018   Aortic atherosclerosis (HCC) 08/05/2018   Coronary artery calcification seen on CAT scan 08/05/2018   Annual physical exam 07/30/2018   Palpitations 05/21/2018  Chronic bilateral low back pain without sciatica 05/21/2018   FH: cardiovascular disease 05/21/2018   Cigarette nicotine dependence in remission 05/21/2018   Essential hypertension 05/13/2018   Fatty liver 05/13/2018   Night sweats 05/13/2018   Osteoporosis 05/13/2018   Type 2 diabetes mellitus (HCC) 05/07/2018   History of colon polyps 05/07/2018   Nonspecific chest pain 05/07/2018   Urinary incontinence 05/07/2018   Collagenous colitis 11/27/2017   Special screening for malignant neoplasms, colon    Hyperlipidemia 07/06/2015   Polycythemia, secondary 07/06/2015    PCP: Thersia Flax, MD   REFERRING PROVIDER: Thersia Flax, MD   REFERRING DIAG: M54.16 (ICD-10-CM) - Acute left lumbar radiculopathy   Rationale for Evaluation and Treatment: Rehabilitation  THERAPY DIAG:   Lumbar pain  Piriformis syndrome, left  Muscle weakness (generalized)  ONSET DATE: 02/13/2021  SUBJECTIVE:                                                                                                                                                                                           SUBJECTIVE STATEMENT:  Pt reports she is having some tightness in L groin area and thinks it could be from overdoing the clamshell exercise. Pt states there has been no pain since Saturday, which is great! Pt states she can tell it is "tender" in her L gluteal region. Reports she can really tell the manual therapy using elbow trigger point release helps, but she is usually very sore in the afternoon following it. Reports she is very consistent in doing her HEP.  Pt states with prolonged standing she gets radiating pain down L lateral thigh that will shoot down past her knee.   From Initial Eval: Pt reports she has had some lumbar back pain for 3 years, but 3 weeks ago, it was really tender and sore in the piriformis region. Pt notes that she has been taking 4 ibuprofen and using a lidocaine  patch to help with the pain. Pt feels that moving around and keeping her mind off of it helps. Pt notes that the pain is usually worse when sitting down.   PERTINENT HISTORY:   Pt has PMH: anxiety, DDD, depression, DM2, fatty liver, L ganglion cyst, hyperlipidemia, HTN, IBS, urinary incontinence, and vertigo.  PAIN:  Are you having pain? Yes: NPRS scale: 2/10 Pain location: L buttocks/piriformis Pain description: Dull Achey Aggravating factors: Sitting Relieving factors: Medicine, lidocaine  patch  PRECAUTIONS: None  RED FLAGS: None   WEIGHT BEARING RESTRICTIONS: No  FALLS:  Has patient fallen in last 6 months? No  LIVING ENVIRONMENT: Lives with: lives with their spouse Lives in: House/apartment Stairs: Yes:  Internal: 13 steps; on left going up and External: 3 steps; none Has following equipment at  home: Single point cane, Walker - 2 wheeled, Wheelchair (manual), shower chair, and bed side commode  OCCUPATION: Retired, but takes care of her husband and she takes care of the 3 acres that she lives on.  PLOF: Independent  PATIENT GOALS: "I just want out of pain and I want to know why I have the numbness in the leg.  I also want to know strategies to prevent or resolve this issue in the future."  NEXT MD VISIT: September  OBJECTIVE:  Note: Objective measures were completed at Evaluation unless otherwise noted.  DIAGNOSTIC FINDINGS:  None  PATIENT SURVEYS:  LEFS 90%  COGNITION: Overall cognitive status: Within functional limits for tasks assessed     SENSATION: WFL   PALPATION: Pt has tightness in the piriformis region and ITB as well.    LUMBAR ROM:   AROM eval  Flexion 70%  Extension 65%  Right lateral flexion 75%  Left lateral flexion 75%  Right rotation 80%  Left rotation 80%   (Blank rows = not tested)  LOWER EXTREMITY ROM:  WFL  LOWER EXTREMITY MMT:    MMT Right eval Left eval  Hip flexion 4 4  Hip extension 4- 4-  Hip abduction 3+ 3+  Hip adduction 3+ 3+  Knee flexion 4 4  Knee extension 4 4   (Blank rows = not tested)  LUMBAR SPECIAL TESTS:  Straight leg raise test: Negative and Slump test: Positive on L side  FUNCTIONAL TESTS:  Not performed    TREATMENT DATE: 03/13/24  Educated pt on benefits of manual therapy and soft tissue mobility. Educated on differentiating pain coming from lumbar spine vs piriformis with her symptoms.    Manual: Prone STM to gluteal region for improved pain and tissue extensibility with extensive time spent on L side. Pt noted to have tightness and sensitivity around small rotator muscles including obturator internus or superior gemellus.  May benefit from dry needling at subsequent sessions if possible and appropriate.   TE and NMR: Hooklying Figure 4 stretch, 2x 1 min bouts Hooklying piriformis  stretch, 2x 30 sec bouts Hooklying LTR, 2x10 each direction Hooklying glute bridge, 2x10 Hooklying PPT, x10  - pt reports this is what is causing her L groin pain Hooklying TA x10reps with pt requiring extensive cuing to activate deep core with noticeable impaired motor planning with this Hooklying single knee to chest x30sec on L   PATIENT EDUCATION:  Education details: Pt educated on role of PT and services provided during current POC, along with prognosis and information about the clinic.  Person educated: Patient Education method: Explanation, Demonstration, Tactile cues, Verbal cues, and Handouts Education comprehension: verbalized understanding, returned demonstration, verbal cues required, and tactile cues required  HOME EXERCISE PROGRAM:   Access Code: [email or web address] URL: [email or web address] Date: 03/04/2024 Prepared by: Dawson Europe  Exercises - Supine Bridge  - 1 x daily - 7 x weekly - 3 sets - 10 reps - Clamshell  - 1 x daily - 7 x weekly - 3 sets - 10 reps - Supine Piriformis Stretch with Foot on Ground  - 2 x daily - 7 x weekly - 1 sets - 2 reps - 30 hold - Supine Posterior Pelvic Tilt  - 1 x daily - 7 x weekly - 3 sets - 10 reps - Supine Hip Adduction Isometric with Ball  - 1 x daily - 7 x weekly -  3 sets - 10 reps - Supine Lower Trunk Rotation  - 1 x daily - 7 x weekly - 3 sets - 10 reps  ASSESSMENT:  CLINICAL IMPRESSION:  Pt reports significant improvement in her symptoms with no pain since Saturday, which pt was very excited and grateful for. Therapist provided education to pt on benefits of manual therapy and plan to continue progression of POC as pt tolerates. Patient continues to feel tightness and sensitivity in L gluteal region with more noticeable sensitivity near proximal small rotator muscles. Pt reporting some sensitivity in L groin with her exercises and realized it was occurring during posterior pelvic tilts; therefore, transitioned to  activation of TA, but pt with impaired motor planning and difficulty activating her deep core. Ms. Mckiver will continue to benefit from skilled physical therapy to address remaining deficits in order to improve overall QoL and return to PLOF.        OBJECTIVE IMPAIRMENTS: decreased endurance, decreased strength, hypomobility, and pain.   ACTIVITY LIMITATIONS: sitting, standing, and squatting  PARTICIPATION LIMITATIONS: meal prep, cleaning, laundry, driving, shopping, community activity, and yard work  PERSONAL FACTORS: Past/current experiences, Time since onset of injury/illness/exacerbation, and 3+ comorbidities: anxiety, DDD, vertigo are also affecting patient's functional outcome.  REHAB POTENTIAL: Good  CLINICAL DECISION MAKING: Stable/uncomplicated  EVALUATION COMPLEXITY: Low   GOALS: Goals reviewed with patient? Yes  SHORT TERM GOALS: Target date: 04/01/2024  Pt will be independent with HEP in order to demonstrate increased ability to perform tasks related to occupation/hobbies. Baseline:  Pt given HEP at baseline. Goal status: INITIAL   LONG TERM GOALS: Target date: 04/29/2024  1.  Patient will improve the LEFS by 10% in order to demonstrate functional independence without pain. Baseline: 90% at baseline Goal status: INITIAL  2.  Pt will reduce overall pain level to 0/10 by utilizing a combination of stretching, strengthening exercises, and pain-reducing modalities in order to improve overall QoL. Baseline: Pt 2/10 pain level at rest. Goal status: INITIAL  3.  Pt will improve MMT of hip abduction to be 4+/5 bilaterally to show an improvement of overall hip strength necessary for balance and stability when ambulating. Baseline: 3+/5 bilaterally Goal status: INITIAL  PLAN:  PT FREQUENCY: 1-2x/week  PT DURATION: 8 weeks  PLANNED INTERVENTIONS: 97750- Physical Performance Testing, 97110-Therapeutic exercises, 97530- Therapeutic activity, 97112- Neuromuscular  re-education, 97535- Self Care, 16109- Manual therapy, 97116- Gait training, Dry Needling, Joint mobilization, Joint manipulation, Spinal manipulation, and Spinal mobilization.  PLAN FOR NEXT SESSION:  - Manual therapy of the gluteal region  - Continued assessment of the lower back to rule out lumbar radiculopathy - dry needling if/when appropriate - progression of strength training   Gara Kincade, PT, DPT, NCS, CSRS Physical Therapist - [facility]  [facility]  10:59 AM 03/13/24

## 2024-03-14 ENCOUNTER — Ambulatory Visit: Admitting: Physical Therapy

## 2024-03-18 ENCOUNTER — Ambulatory Visit: Attending: Internal Medicine

## 2024-03-18 DIAGNOSIS — M6281 Muscle weakness (generalized): Secondary | ICD-10-CM | POA: Diagnosis present

## 2024-03-18 DIAGNOSIS — G5702 Lesion of sciatic nerve, left lower limb: Secondary | ICD-10-CM | POA: Diagnosis present

## 2024-03-18 DIAGNOSIS — M545 Low back pain, unspecified: Secondary | ICD-10-CM | POA: Insufficient documentation

## 2024-03-18 NOTE — Therapy (Signed)
 OUTPATIENT PHYSICAL THERAPY THORACOLUMBAR TREATMENT   Patient Name: ABRIANNA SIDMAN MRN: [id number] DOB:1956/05/09, 68 y.o., female Today's Date: 03/19/2024  END OF SESSION:   PT End of Session - 03/18/24 1409     Visit Number 4    Number of Visits 16    Date for PT Re-Evaluation 04/29/24    Progress Note Due on Visit 10    PT Start Time 1405    PT Stop Time 1445    PT Time Calculation (min) 40 min    Activity Tolerance Patient tolerated treatment well    Behavior During Therapy [facility] for tasks assessed/performed               Past Medical History:  Diagnosis Date   Allergy ?   seasonal   Anxiety    occasional   Chicken pox    Collagenous colitis    Dr. Ole Berkeley    Colon polyps    COVID-19    11/02/20 from husband pnd + with sxs   DDD (degenerative disc disease), cervical    had PT in the past, noted imaging 05/31/10    Depression    Diabetes mellitus without complication (HCC)    Type 2   Fatty liver    Ganglion cyst of wrist, left    Hemorrhoids    Hemorrhoids    History of colon polyps    History of prediabetes    Hyperlipidemia 07/06/2015   controlled by meds   Hypertension    controlled by meds   IBS (irritable bowel syndrome) 07/06/2015   Lichen sclerosus    NS (neck stiffness)    xray shows something at "C3" approx 4 yrs ago. Had PT. Helped.   Polycythemia, secondary 07/06/2015   Prediabetes    SCC (squamous cell carcinoma)    scalp s/p mohs 11/07/2018 unc Dr. Robert Chimes   Urinary incontinence    Vertigo    occasional   Vitamin D  deficiency 07/06/2015   Past Surgical History:  Procedure Laterality Date   BREAST BIOPSY Left 06/30/2021   US  bx, coil marker, BENIGN MAMMARY PARENCHYMA WITH MILD STROMAL FIBROSIS. - NEGATIVE FOR ATYPICAL PROLIFERATIVE   COLONOSCOPY  2006   COLONOSCOPY WITH PROPOFOL  N/A 08/12/2015   Procedure: COLONOSCOPY WITH PROPOFOL ;  Surgeon: Marnee Sink, MD;  Location: [facility];  Service: Endoscopy;  Laterality: N/A;  PT  WOULD LIKE EARLY AM PER JS   COLONOSCOPY WITH PROPOFOL  N/A 09/15/2022   Procedure: COLONOSCOPY WITH PROPOFOL ;  Surgeon: Marnee Sink, MD;  Location: [facility];  Service: Endoscopy;  Laterality: N/A;   EYE SURGERY  05/17/2020  &  06/07/2020   right eye 05/17/20 and 06/07/20 Dr. Chip Coulter my eye MD f/u 06/2020 cataract surgery narrow angles   HYSTEROSALPINGOGRAM     1983   TONSILLECTOMY AND ADENOIDECTOMY     age [age]   Patient Active Problem List   Diagnosis Date Noted   Lichen sclerosus et atrophicus 01/10/2024   Rash and nonspecific skin eruption 05/23/2023   Dermatitis 05/22/2023   Dysphagia 12/29/2021   Diverticulosis 06/02/2021   Vaginal atrophy 05/25/2021   Postmenopausal bleeding 03/29/2021   Hypertension associated with diabetes (HCC) 12/21/2020   Overweight (BMI 25.0-29.9) 06/23/2020   Chronic pain of right knee 12/31/2019   Anxiety 12/31/2019   Mitral annular calcification 07/18/2019   SCC (squamous cell carcinoma) 11/10/2018   Aortic atherosclerosis (HCC) 08/05/2018   Coronary artery calcification seen on CAT scan 08/05/2018   Annual physical exam 07/30/2018   Palpitations  05/21/2018   Chronic bilateral low back pain without sciatica 05/21/2018   FH: cardiovascular disease 05/21/2018   Cigarette nicotine dependence in remission 05/21/2018   Essential hypertension 05/13/2018   Fatty liver 05/13/2018   Night sweats 05/13/2018   Osteoporosis 05/13/2018   Type 2 diabetes mellitus (HCC) 05/07/2018   History of colon polyps 05/07/2018   Nonspecific chest pain 05/07/2018   Urinary incontinence 05/07/2018   Collagenous colitis 11/27/2017   Special screening for malignant neoplasms, colon    Hyperlipidemia 07/06/2015   Polycythemia, secondary 07/06/2015    PCP: Thersia Flax, MD   REFERRING PROVIDER: Thersia Flax, MD   REFERRING DIAG: M54.16 (ICD-10-CM) - Acute left lumbar radiculopathy   Rationale for Evaluation and Treatment: Rehabilitation  THERAPY DIAG:   Lumbar pain  Piriformis syndrome, left  Muscle weakness (generalized)  ONSET DATE: 02/13/2021  SUBJECTIVE:                                                                                                                                                                                           SUBJECTIVE STATEMENT:  Pt reports she is having some tightness in L gluteal area. Pt states she is feeling so much better overall. Pt states she can tell it is "tender" in her L gluteal region.Reports not as consistent in doing her HEP due to being busy at home. Reports supposed to [facility] tomorrow.      From Initial Eval: Pt reports she has had some lumbar back pain for 3 years, but 3 weeks ago, it was really tender and sore in the piriformis region. Pt notes that she has been taking 4 ibuprofen and using a lidocaine  patch to help with the pain. Pt feels that moving around and keeping her mind off of it helps. Pt notes that the pain is usually worse when sitting down.   PERTINENT HISTORY:   Pt has PMH: anxiety, DDD, depression, DM2, fatty liver, L ganglion cyst, hyperlipidemia, HTN, IBS, urinary incontinence, and vertigo.  PAIN:  Are you having pain? Yes: NPRS scale: 2/10 Pain location: L buttocks/piriformis Pain description: Dull Achey Aggravating factors: Sitting Relieving factors: Medicine, lidocaine  patch  PRECAUTIONS: None  RED FLAGS: None   WEIGHT BEARING RESTRICTIONS: No  FALLS:  Has patient fallen in last 6 months? No  LIVING ENVIRONMENT: Lives with: lives with their spouse Lives in: House/apartment Stairs: Yes: Internal: 13 steps; on left going up and External: 3 steps; none Has following equipment at home: Single point cane, Walker - 2 wheeled, Wheelchair (manual), shower chair, and bed side commode  OCCUPATION: Retired, but takes care of her husband and she takes  care of the 3 acres that she lives on.  PLOF: Independent  PATIENT GOALS: "I just want out of pain and I want  to know why I have the numbness in the leg.  I also want to know strategies to prevent or resolve this issue in the future."  NEXT MD VISIT: September  OBJECTIVE:  Note: Objective measures were completed at Evaluation unless otherwise noted.  DIAGNOSTIC FINDINGS:  None  PATIENT SURVEYS:  LEFS 90%  COGNITION: Overall cognitive status: Within functional limits for tasks assessed     SENSATION: WFL   PALPATION: Pt has tightness in the piriformis region and ITB as well.    LUMBAR ROM:   AROM eval  Flexion 70%  Extension 65%  Right lateral flexion 75%  Left lateral flexion 75%  Right rotation 80%  Left rotation 80%   (Blank rows = not tested)  LOWER EXTREMITY ROM:  WFL  LOWER EXTREMITY MMT:    MMT Right eval Left eval  Hip flexion 4 4  Hip extension 4- 4-  Hip abduction 3+ 3+  Hip adduction 3+ 3+  Knee flexion 4 4  Knee extension 4 4   (Blank rows = not tested)  LUMBAR SPECIAL TESTS:  Straight leg raise test: Negative and Slump test: Positive on L side  FUNCTIONAL TESTS:  Not performed    TREATMENT DATE: 03/18/24  Educated pt on benefits of manual therapy and soft tissue mobility. Educated on differentiating pain coming from lumbar spine vs piriformis with her symptoms.    Manual: Prone STM to gluteal region (Piriformis)  for improved pain and tissue extensibility with extensive time spent on L side.     TE:   Hooklying Figure 4 stretch, 2x 1 min bouts Hooklying piriformis stretch, 2x 30 sec bouts Hooklying LTR, 2x10 each direction Hooklying glute bridge, 2x10 (VC to squeeze glutes)  Hooklying TA x10reps with pt requiring extensive cuing to activate deep core with noticeable impaired motor planning with this Hooklying single knee to chest x30sec on L   PATIENT EDUCATION:  Education details: Pt educated on role of PT and services provided during current POC, along with prognosis and information about the clinic.  Person educated:  Patient Education method: Explanation, Demonstration, Tactile cues, Verbal cues, and Handouts Education comprehension: verbalized understanding, returned demonstration, verbal cues required, and tactile cues required  HOME EXERCISE PROGRAM:   Access Code: [email or web address] URL: [email or web address] Date: 03/04/2024 Prepared by: Dawson Europe  Exercises - Supine Bridge  - 1 x daily - 7 x weekly - 3 sets - 10 reps - Clamshell  - 1 x daily - 7 x weekly - 3 sets - 10 reps - Supine Piriformis Stretch with Foot on Ground  - 2 x daily - 7 x weekly - 1 sets - 2 reps - 30 hold - Supine Posterior Pelvic Tilt  - 1 x daily - 7 x weekly - 3 sets - 10 reps - Supine Hip Adduction Isometric with Ball  - 1 x daily - 7 x weekly - 3 sets - 10 reps - Supine Lower Trunk Rotation  - 1 x daily - 7 x weekly - 3 sets - 10 reps  ASSESSMENT:  CLINICAL IMPRESSION:   Pt presented with no L groin pain but some tightness and sensitivity with left piriformis. She responded well to education in TA contraction in hooklye position and able to contract these muscles better. She responded well again to manual techniques with STM to piriformis muscle and able to stretch  today without any increased pain. Ms. Charo will continue to benefit from skilled physical therapy to address remaining deficits in order to improve overall QoL and return to PLOF.        OBJECTIVE IMPAIRMENTS: decreased endurance, decreased strength, hypomobility, and pain.   ACTIVITY LIMITATIONS: sitting, standing, and squatting  PARTICIPATION LIMITATIONS: meal prep, cleaning, laundry, driving, shopping, community activity, and yard work  PERSONAL FACTORS: Past/current experiences, Time since onset of injury/illness/exacerbation, and 3+ comorbidities: anxiety, DDD, vertigo are also affecting patient's functional outcome.  REHAB POTENTIAL: Good  CLINICAL DECISION MAKING: Stable/uncomplicated  EVALUATION COMPLEXITY: Low   GOALS: Goals  reviewed with patient? Yes  SHORT TERM GOALS: Target date: 04/01/2024  Pt will be independent with HEP in order to demonstrate increased ability to perform tasks related to occupation/hobbies. Baseline:  Pt given HEP at baseline. Goal status: INITIAL   LONG TERM GOALS: Target date: 04/29/2024  1.  Patient will improve the LEFS by 10% in order to demonstrate functional independence without pain. Baseline: 90% at baseline Goal status: INITIAL  2.  Pt will reduce overall pain level to 0/10 by utilizing a combination of stretching, strengthening exercises, and pain-reducing modalities in order to improve overall QoL. Baseline: Pt 2/10 pain level at rest. Goal status: INITIAL  3.  Pt will improve MMT of hip abduction to be 4+/5 bilaterally to show an improvement of overall hip strength necessary for balance and stability when ambulating. Baseline: 3+/5 bilaterally Goal status: INITIAL  PLAN:  PT FREQUENCY: 1-2x/week  PT DURATION: 8 weeks  PLANNED INTERVENTIONS: 97750- Physical Performance Testing, 97110-Therapeutic exercises, 97530- Therapeutic activity, 97112- Neuromuscular re-education, 97535- Self Care, 16109- Manual therapy, 97116- Gait training, Dry Needling, Joint mobilization, Joint manipulation, Spinal manipulation, and Spinal mobilization.  PLAN FOR NEXT SESSION:  - Manual therapy of the gluteal region for pain relief prn - dry needling if/when appropriate - progression of core stabilization/hip strength training   Ossie Blend, PT Physical Therapist - [facility]  [facility]  1:55 PM 03/19/24

## 2024-03-20 ENCOUNTER — Ambulatory Visit: Admitting: Physical Therapy

## 2024-03-20 DIAGNOSIS — G5702 Lesion of sciatic nerve, left lower limb: Secondary | ICD-10-CM

## 2024-03-20 DIAGNOSIS — M545 Low back pain, unspecified: Secondary | ICD-10-CM | POA: Diagnosis not present

## 2024-03-20 DIAGNOSIS — M6281 Muscle weakness (generalized): Secondary | ICD-10-CM

## 2024-03-20 NOTE — Therapy (Signed)
OUTPATIENT PHYSICAL THERAPY THORACOLUMBAR TREATMENT   Patient Name: Vanessa Herman MRN: [id number] DOB:[birth year], 68 y.o., female Today's Date: 03/20/2024  END OF SESSION:    PT End of Session - 03/20/24 1407     Visit Number 5    Number of Visits 16    Date for PT Re-Evaluation 04/29/24    Progress Note Due on Visit 10    PT Start Time 1407    PT Stop Time 1432    PT Time Calculation (min) 25 min    Activity Tolerance Patient tolerated treatment well    Behavior During Therapy [facility] for tasks assessed/performed                Past Medical History:  Diagnosis Date   Allergy ?   seasonal   Anxiety    occasional   Chicken pox    Collagenous colitis    Dr. Ole Berkeley    Colon polyps    COVID-19    11/02/20 from husband pnd + with sxs   DDD (degenerative disc disease), cervical    had PT in the past, noted imaging 05/31/10    Depression    Diabetes mellitus without complication (HCC)    Type 2   Fatty liver    Ganglion cyst of wrist, left    Hemorrhoids    Hemorrhoids    History of colon polyps    History of prediabetes    Hyperlipidemia 07/06/2015   controlled by meds   Hypertension    controlled by meds   IBS (irritable bowel syndrome) 07/06/2015   Lichen sclerosus    NS (neck stiffness)    xray shows something at "C3" approx 4 yrs ago. Had PT. Helped.   Polycythemia, secondary 07/06/2015   Prediabetes    SCC (squamous cell carcinoma)    scalp s/p mohs 11/07/2018 unc Dr. Robert Chimes   Urinary incontinence    Vertigo    occasional   Vitamin D  deficiency 07/06/2015   Past Surgical History:  Procedure Laterality Date   BREAST BIOPSY Left 06/30/2021   US  bx, coil marker, BENIGN MAMMARY PARENCHYMA WITH MILD STROMAL FIBROSIS. - NEGATIVE FOR ATYPICAL PROLIFERATIVE   COLONOSCOPY  2006   COLONOSCOPY WITH PROPOFOL  N/A 08/12/2015   Procedure: COLONOSCOPY WITH PROPOFOL ;  Surgeon: Marnee Sink, MD;  Location: [facility];  Service: Endoscopy;  Laterality: N/A;  PT  WOULD LIKE EARLY AM PER JS   COLONOSCOPY WITH PROPOFOL  N/A 09/15/2022   Procedure: COLONOSCOPY WITH PROPOFOL ;  Surgeon: Marnee Sink, MD;  Location: [facility];  Service: Endoscopy;  Laterality: N/A;   EYE SURGERY  05/17/2020  &  06/07/2020   right eye 05/17/20 and 06/07/20 Dr. Chip Coulter my eye MD f/u 06/2020 cataract surgery narrow angles   HYSTEROSALPINGOGRAM     1983   TONSILLECTOMY AND ADENOIDECTOMY     age [age]   Patient Active Problem List   Diagnosis Date Noted   Lichen sclerosus et atrophicus 01/10/2024   Rash and nonspecific skin eruption 05/23/2023   Dermatitis 05/22/2023   Dysphagia 12/29/2021   Diverticulosis 06/02/2021   Vaginal atrophy 05/25/2021   Postmenopausal bleeding 03/29/2021   Hypertension associated with diabetes (HCC) 12/21/2020   Overweight (BMI 25.0-29.9) 06/23/2020   Chronic pain of right knee 12/31/2019   Anxiety 12/31/2019   Mitral annular calcification 07/18/2019   SCC (squamous cell carcinoma) 11/10/2018   Aortic atherosclerosis (HCC) 08/05/2018   Coronary artery calcification seen on CAT scan 08/05/2018   Annual physical exam 07/30/2018  Palpitations 05/21/2018   Chronic bilateral low back pain without sciatica 05/21/2018   FH: cardiovascular disease 05/21/2018   Cigarette nicotine dependence in remission 05/21/2018   Essential hypertension 05/13/2018   Fatty liver 05/13/2018   Night sweats 05/13/2018   Osteoporosis 05/13/2018   Type 2 diabetes mellitus (HCC) 05/07/2018   History of colon polyps 05/07/2018   Nonspecific chest pain 05/07/2018   Urinary incontinence 05/07/2018   Collagenous colitis 11/27/2017   Special screening for malignant neoplasms, colon    Hyperlipidemia 07/06/2015   Polycythemia, secondary 07/06/2015    PCP: Thersia Flax, MD   REFERRING PROVIDER: Thersia Flax, MD   REFERRING DIAG: M54.16 (ICD-10-CM) - Acute left lumbar radiculopathy   Rationale for Evaluation and Treatment: Rehabilitation  THERAPY DIAG:   Lumbar pain  Piriformis syndrome, left  Muscle weakness (generalized)  ONSET DATE: 02/13/2021  SUBJECTIVE:                                                                                                                                                                                           SUBJECTIVE STATEMENT:  Pt reports she had a really busy day yesterday mowing and doing household tasks. Pt states "I'm not in pain." Pt states she has only performed her exercises at therapy recently due to being so busy at home, but also states she hasn't had any pain. States the last time she had pain was probably Saturday, May 24th.   Pt requesting to only have 1 more therapy appointment in ~1 month from now to have as a follow-up in the event her pain returns; however, if she continues to be free of pain, patient requests to be discharged from therapy.     From Initial Eval: Pt reports she has had some lumbar back pain for 3 years, but 3 weeks ago, it was really tender and sore in the piriformis region. Pt notes that she has been taking 4 ibuprofen and using a lidocaine  patch to help with the pain. Pt feels that moving around and keeping her mind off of it helps. Pt notes that the pain is usually worse when sitting down.   PERTINENT HISTORY:   Pt has PMH: anxiety, DDD, depression, DM2, fatty liver, L ganglion cyst, hyperlipidemia, HTN, IBS, urinary incontinence, and vertigo.  PAIN:  Are you having pain? Yes: NPRS scale: 2/10 Pain location: L buttocks/piriformis Pain description: Dull [place] Aggravating factors: Sitting Relieving factors: Medicine, lidocaine  patch  PRECAUTIONS: None  RED FLAGS: None   WEIGHT BEARING RESTRICTIONS: No  FALLS:  Has patient fallen in last 6 months? No  LIVING ENVIRONMENT: Lives with: lives with their  spouse Lives in: House/apartment Stairs: Yes: Internal: 13 steps; on left going up and External: 3 steps; none Has following equipment at home: Single  point cane, Walker - 2 wheeled, Wheelchair (manual), shower chair, and bed side commode  OCCUPATION: Retired, but takes care of her husband and she takes care of the 3 acres that she lives on.  PLOF: Independent  PATIENT GOALS: "I just want out of pain and I want to know why I have the numbness in the leg.  I also want to know strategies to prevent or resolve this issue in the future."  NEXT MD VISIT: September  OBJECTIVE:  Note: Objective measures were completed at Evaluation unless otherwise noted.  DIAGNOSTIC FINDINGS:  None  PATIENT SURVEYS:  LEFS 90%  COGNITION: Overall cognitive status: Within functional limits for tasks assessed     SENSATION: WFL   PALPATION: Pt has tightness in the piriformis region and ITB as well.    LUMBAR ROM:   AROM eval  Flexion 70%  Extension 65%  Right lateral flexion 75%  Left lateral flexion 75%  Right rotation 80%  Left rotation 80%   (Blank rows = not tested)  LOWER EXTREMITY ROM:  WFL  LOWER EXTREMITY MMT:    MMT Right eval Left eval 03/20/2024 RIGHT 03/20/2024 LEFT  Hip flexion 4 4 4+ 4-  Hip extension 4- 4- 4+ 4+  Hip abduction 3+ 3+ 4+ 4+  Hip adduction 3+ 3+ 4+ 4+  Knee flexion 4 4 4+ 4+  Knee extension 4 4 4+ 4+   (Blank rows = not tested)  Manual Muscle Test Scale 0/5 = No muscle contraction can be seen or felt 1/5 = Contraction can be felt, but there is no motion 2-/5 = Part moves through incomplete ROM w/ gravity decreased 2/5 = Part moves through complete ROM w/ gravity decreased 2+/5 = Part moves through incomplete ROM (<50%) against gravity or through complete ROM w/ gravity 3-/5 = Part moves through incomplete ROM (>50%) against gravity 3/5 = Part moves through complete ROM against gravity 3+/5 = Part moves through complete ROM against gravity/slight resistance 4-/5= Holds test position against slight to moderate pressure 4/5 = Part moves through complete ROM against gravity/moderate resistance 4+/5=  Holds test position against moderate to strong pressure 5/5 = Part moves through complete ROM against gravity/full resistance  LUMBAR SPECIAL TESTS:  Straight leg raise test: Negative and Slump test: Positive on L side  FUNCTIONAL TESTS:  Not performed    TREATMENT DATE: 03/20/2024  Therapy session focused on re-assessment of strength, pain, and subjective questionnaire to determine pt's progress with therapy thus far based on pt stating overall resolution of her pain and feeling close to discharge.  MMT updated above with significant improvement in her strength noted  LEFS 76% with pt reporting some of the items are impacted by her balance, but they don't have anything to do with the reason she came to therapy - scores 4 on everything, except scoring 3 on: squatting, standing for 1 hour, running on uneven ground, and making sharp turns while running fast   Patient reporting she feels comfortable and confident performing her HEP and declines reviewing it during session today.   Pt reports no additional questions/concerns and is in agreement with POC.   PATIENT EDUCATION:  Education details: Pt educated on role of PT and services provided during current POC, along with prognosis and information about the clinic.  Person educated: Patient Education method: Explanation, Demonstration, Tactile cues, Verbal  cues, and Handouts Education comprehension: verbalized understanding, returned demonstration, verbal cues required, and tactile cues required  HOME EXERCISE PROGRAM:   Access Code: [email or web address] URL: [email or web address] Date: 03/04/2024 Prepared by: Dawson Europe  Exercises - Supine Bridge  - 1 x daily - 7 x weekly - 3 sets - 10 reps - Clamshell  - 1 x daily - 7 x weekly - 3 sets - 10 reps - Supine Piriformis Stretch with Foot on Ground  - 2 x daily - 7 x weekly - 1 sets - 2 reps - 30 hold - Supine Posterior Pelvic Tilt  - 1 x daily - 7 x weekly - 3 sets - 10 reps -  Supine Hip Adduction Isometric with Ball  - 1 x daily - 7 x weekly - 3 sets - 10 reps - Supine Lower Trunk Rotation  - 1 x daily - 7 x weekly - 3 sets - 10 reps  ASSESSMENT:  CLINICAL IMPRESSION:  Patient reports she has not had any pain since Saturday, May 24th and feels her pain is now resolved. Patient requests to leave 1 therapy appointment on the schedule in ~1 month in the event her pain returns; however, if her pain does not return, she feels she is ready to be discharged from therapy. Patient reports no questions/concerns regarding HEP. Ms. Colgate will continue to benefit from skilled physical therapy to address remaining deficits in order to improve overall QoL and return to PLOF.        OBJECTIVE IMPAIRMENTS: decreased endurance, decreased strength, hypomobility, and pain.   ACTIVITY LIMITATIONS: sitting, standing, and squatting  PARTICIPATION LIMITATIONS: meal prep, cleaning, laundry, driving, shopping, community activity, and yard work  PERSONAL FACTORS: Past/current experiences, Time since onset of injury/illness/exacerbation, and 3+ comorbidities: anxiety, DDD, vertigo are also affecting patient's functional outcome.  REHAB POTENTIAL: Good  CLINICAL DECISION MAKING: Stable/uncomplicated  EVALUATION COMPLEXITY: Low   GOALS: Goals reviewed with patient? Yes  SHORT TERM GOALS: Target date: 04/01/2024  Pt will be independent with HEP in order to demonstrate increased ability to perform tasks related to occupation/hobbies. Baseline:  Pt given HEP at baseline. 03/20/2024: pt reports feeling confident with HEP Goal status: MET   LONG TERM GOALS: Target date: 04/29/2024  1.  Patient will improve the LEFS by 10% in order to demonstrate functional independence without pain. Baseline: 90% at baseline 03/20/2024: 76% Goal status: IN PROGRESS  2.  Pt will reduce overall pain level to 0/10 by utilizing a combination of stretching, strengthening exercises, and pain-reducing  modalities in order to improve overall QoL. Baseline: Pt 2/10 pain level at rest. 03/20/2024: 0/10  Goal status: MET  3.  Pt will improve MMT of hip abduction to be 4+/5 bilaterally to show an improvement of overall hip strength necessary for balance and stability when ambulating. Baseline: 3+/5 bilaterally 03/20/2024: 4+/5 bilaterally, except in L hip flexors  Goal status: MET  PLAN:  PT FREQUENCY: 1-2x/week  PT DURATION: 8 weeks  PLANNED INTERVENTIONS: 97750- Physical Performance Testing, 97110-Therapeutic exercises, 97530- Therapeutic activity, 97112- Neuromuscular re-education, 97535- Self Care, 32440- Manual therapy, 97116- Gait training, Dry Needling, Joint mobilization, Joint manipulation, Spinal manipulation, and Spinal mobilization.  PLAN FOR NEXT SESSION:  follow-up scheduled on July 11th in event pain returns; otherwise, discharge - Manual therapy of the gluteal region for pain relief prn - progression of core stabilization/hip strength training     Tyland Klemens, PT, DPT, NCS, CSRS Physical Therapist -   [facility]  2:36 PM  03/20/24  

## 2024-03-25 ENCOUNTER — Ambulatory Visit: Admitting: Physical Therapy

## 2024-03-26 ENCOUNTER — Encounter: Admitting: Physical Therapy

## 2024-04-01 ENCOUNTER — Encounter: Admitting: Physical Therapy

## 2024-04-02 ENCOUNTER — Encounter: Admitting: Physical Therapy

## 2024-04-08 ENCOUNTER — Encounter: Admitting: Physical Therapy

## 2024-04-11 ENCOUNTER — Ambulatory Visit: Admitting: Physical Therapy

## 2024-04-15 ENCOUNTER — Encounter

## 2024-04-16 ENCOUNTER — Encounter: Admitting: Physical Therapy

## 2024-04-23 ENCOUNTER — Ambulatory Visit: Payer: Medicare Other | Admitting: *Deleted

## 2024-04-23 VITALS — Ht <= 58 in | Wt 129.0 lb

## 2024-04-23 DIAGNOSIS — Z0000 Encounter for general adult medical examination without abnormal findings: Secondary | ICD-10-CM

## 2024-04-23 NOTE — Patient Instructions (Signed)
 Vanessa Herman , Thank you for taking time out of your busy schedule to complete your Annual Wellness Visit with me. I enjoyed our conversation and look forward to speaking with you again next year. I, as well as your care team,  appreciate your ongoing commitment to your health goals. Please review the following plan we discussed and let me know if I can assist you in the future. Your Game plan/ To Do List    Referrals: If you haven't heard from the office you've been referred to, please reach out to them at the phone provided.  Consider updating your vaccines Follow up Visits: Next Medicare AWV with our clinical staff: 04/29/25 @ 9:30   Have you seen your provider in the last 6 months (3 months if uncontrolled diabetes)? Yes Next Office Visit with your provider: 07/10/24  Clinician Recommendations:  Aim for 30 minutes of exercise or brisk walking, 6-8 glasses of water , and 5 servings of fruits and vegetables each day.       This is a list of the screening recommended for you and due dates:  Health Maintenance  Topic Date Due   COVID-19 Vaccine (1) Never done   Pneumococcal Vaccine for age over 50 (1 of 2 - PCV) Never done   Flu Shot  05/16/2024   Mammogram  06/18/2024   Hemoglobin A1C  07/10/2024   Eye exam for diabetics  12/17/2024   Yearly kidney function blood test for diabetes  01/07/2025   Yearly kidney health urinalysis for diabetes  01/09/2025   Complete foot exam   01/09/2025   Screening for Lung Cancer  01/14/2025   Medicare Annual Wellness Visit  04/23/2025   DTaP/Tdap/Td vaccine (2 - Td or Tdap) 07/30/2028   Colon Cancer Screening  09/15/2032   Hepatitis B Vaccine  Completed   DEXA scan (bone density measurement)  Completed   Hepatitis C Screening  Completed   HPV Vaccine  Aged Out   Meningitis B Vaccine  Aged Out   Zoster (Shingles) Vaccine  Discontinued    Advanced directives: (Copy Requested) Please bring a copy of your health care power of attorney and living will to  the office to be added to your chart at your convenience. You can mail to [facility] [address] [place], [place] [address] or email to [email or web address] Advance Care Planning is important because it:  [x]  Makes sure you receive the medical care that is consistent with your values, goals, and preferences  [x]  It provides guidance to your family and loved ones and reduces their decisional burden about whether or not they are making the right decisions based on your wishes.

## 2024-04-23 NOTE — Progress Notes (Signed)
 Subjective:   Vanessa Herman is a 68 y.o. who presents for a Medicare Wellness preventive visit.  As a reminder, Annual Wellness Visits don't include a physical exam, and some assessments may be limited, especially if this visit is performed virtually. We may recommend an in-person follow-up visit with your provider if needed.  Visit Complete: Virtual I connected with  Vanessa Herman on 04/23/24 by a audio enabled telemedicine application and verified that I am speaking with the correct person using two identifiers.  Patient Location: Home  Provider Location: Home Office  I discussed the limitations of evaluation and management by telemedicine. The patient expressed understanding and agreed to proceed.  Vital Signs: Because this visit was a virtual/telehealth visit, some criteria may be missing or patient reported. Any vitals not documented were not able to be obtained and vitals that have been documented are patient reported.  VideoDeclined- This patient declined Librarian, academic. Therefore the visit was completed with audio only.  Persons Participating in Visit: Patient.  AWV Questionnaire: Yes: Patient Medicare AWV questionnaire was completed by the patient on 04/22/24; I have confirmed that all information answered by patient is correct and no changes since this date.  Cardiac Risk Factors include: advanced age (>13men, >62 women);diabetes mellitus;dyslipidemia;hypertension     Objective:    Today's Vitals   04/23/24 0921  Weight: 129 lb (58.5 kg)  Height: 4' 10 (1.473 m)   Body mass index is 26.96 kg/m.     04/23/2024    9:44 AM 03/04/2024    9:43 AM 04/11/2023   10:43 AM 01/18/2022    2:08 PM 08/12/2015    8:14 AM  Advanced Directives  Does Patient Have a Medical Advance Directive? Yes No No No Yes   Type of Estate agent of [place];Living will    Living will   Does patient want to make changes to medical advance  directive?     No - Patient declined   Copy of Healthcare Power of Attorney in Chart? No - copy requested    No - copy requested   Would patient like information on creating a medical advance directive?  No - Patient declined  No - Patient declined      Data saved with a previous flowsheet row definition    Current Medications (verified) Outpatient Encounter Medications as of 04/23/2024  Medication Sig   Accu-Chek Softclix Lancets lancets Use to check blood sugare once daily.  e11.59   Ascorbic Acid (VITAMIN C DROPS MT) Take 500 mg by mouth as needed.   Berberine Chloride (BERBERINE HCI PO) Take by mouth every other day.   blood glucose meter kit and supplies Dispense based on patient and insurance preference. Use up to four times daily as directed. (FOR ICD-10 E10.9, E11.9).   calcium  carbonate (OSCAL) 1500 (600 Ca) MG TABS tablet Take by mouth daily with breakfast.   Cholecalciferol (VITAMIN D ) 2000 units CAPS Vitamin D3 2,000 unit capsule   1 capsule every day by oral route. (Patient taking differently: 4,000 Units.)   clobetasol ointment (TEMOVATE) 0.05 % Apply topically.   Coenzyme Q10 (CO Q 10) 100 MG CAPS Take 1 capsule by mouth 3 (three) times a week.   diazepam  (VALIUM ) 5 MG tablet Take 1 tablet (5 mg total) by mouth daily as needed for anxiety.   GARLIC PO Take by mouth 2 (two) times daily with a meal.   glucose blood (ACCU-CHEK GUIDE) test strip Use to check blood  sugar once daily.  e11.59   lisinopril  (ZESTRIL ) 20 MG tablet Take 1 tablet (20 mg total) by mouth daily.   MAGNESIUM COMPLEX HIGH POTENCY PO Take by mouth every other day.   Menaquinone-7 (VITAMIN K2 PO) Take by mouth every other day.   metFORMIN  (GLUCOPHAGE ) 500 MG tablet Take 1 tablet (500 mg total) by mouth 2 (two) times daily with a meal.   metFORMIN  (GLUCOPHAGE -XR) 500 MG 24 hr tablet Take 2 tablets (1,000 mg total) by mouth daily with breakfast.   metoprolol  succinate (TOPROL -XL) 50 MG 24 hr tablet Take 1 tablet  (50 mg total) by mouth every evening.   milk thistle 175 MG tablet Take 175 mg by mouth every other day.   Multiple Vitamin (MULTIVITAMIN) tablet Take 1 tablet by mouth every other day.    UNABLE TO FIND every other day. Med Name: [place] Root   Zinc 50 MG TABS Take 50 mg by mouth as needed.   Acetylcysteine (NAC PO) Take by mouth every other day. (Patient not taking: Reported on 04/23/2024)   aspirin 81 MG tablet Take 81 mg by mouth every other day.  (Patient not taking: Reported on 04/23/2024)   No facility-administered encounter medications on file as of 04/23/2024.    Allergies (verified) Adhesive [tape], Atorvastatin , Tilactase, Neomycin, Pollen extract, Crestor  [rosuvastatin ], Levaquin [levofloxacin in d5w], and Collagen   History: Past Medical History:  Diagnosis Date   Allergy ?   seasonal   Anxiety    occasional   Chicken pox    Collagenous colitis    Dr. Jinny    Colon polyps    COVID-19    11/02/20 from husband pnd + with sxs   DDD (degenerative disc disease), cervical    had PT in the past, noted imaging 05/31/10    Depression    Diabetes mellitus without complication (HCC)    Type 2   Fatty liver    Ganglion cyst of wrist, left    Hemorrhoids    Hemorrhoids    History of colon polyps    History of prediabetes    Hyperlipidemia 07/06/2015   controlled by meds   Hypertension    controlled by meds   IBS (irritable bowel syndrome) 07/06/2015   Lichen sclerosus    NS (neck stiffness)    xray shows something at C3 approx 4 yrs ago. Had PT. Helped.   Polycythemia, secondary 07/06/2015   Prediabetes    SCC (squamous cell carcinoma)    scalp s/p mohs 11/07/2018 unc Dr. Gregorio   Urinary incontinence    Vertigo    occasional   Vitamin D  deficiency 07/06/2015   Past Surgical History:  Procedure Laterality Date   BREAST BIOPSY Left 06/30/2021   US  bx, coil marker, BENIGN MAMMARY PARENCHYMA WITH MILD STROMAL FIBROSIS. - NEGATIVE FOR ATYPICAL PROLIFERATIVE    COLONOSCOPY  2006   COLONOSCOPY WITH PROPOFOL  N/A 08/12/2015   Procedure: COLONOSCOPY WITH PROPOFOL ;  Surgeon: Rogelia Jinny, MD;  Location: [facility];  Service: Endoscopy;  Laterality: N/A;  PT WOULD LIKE EARLY AM PER JS   COLONOSCOPY WITH PROPOFOL  N/A 09/15/2022   Procedure: COLONOSCOPY WITH PROPOFOL ;  Surgeon: Jinny Rogelia, MD;  Location: [facility];  Service: Endoscopy;  Laterality: N/A;   EYE SURGERY  05/17/2020  &  06/07/2020   right eye 05/17/20 and 06/07/20 Dr. Royetta my eye MD f/u 06/2020 cataract surgery narrow angles   HYSTEROSALPINGOGRAM     1983   TONSILLECTOMY AND ADENOIDECTOMY  age [age]   Family History  Problem Relation Age of Onset   Breast cancer Maternal Grandmother 49   Cancer Maternal Grandmother        breast   Hypertension Father    Hyperlipidemia Father    COPD Father    Hearing loss Father    Heart disease Father    Heart attack Father    Arthritis Father    Diabetes Mother    Hyperlipidemia Mother    Stroke Mother    Thyroid  disease Daughter    ADD / ADHD Daughter    Heart disease Maternal Grandfather    Heart attack Maternal Grandfather    Heart disease Paternal Grandfather    Heart attack Paternal Grandfather    Osteoporosis Other    Heart disease Paternal Grandmother    Diabetes Paternal Grandmother    Hypertension Paternal Grandmother    Stroke Paternal Grandmother    ADD / ADHD Daughter    Alcohol abuse Daughter    Social History   Socioeconomic History   Marital status: Married    Spouse name: Not on file   Number of children: Not on file   Years of education: Not on file   Highest education level: 12th grade  Occupational History   Not on file  Tobacco Use   Smoking status: Former    Current packs/day: 0.00    Average packs/day: 1.3 packs/day for 43.6 years (54.6 ttl pk-yrs)    Types: Cigarettes    Start date: 04/03/1972    Quit date: 04/03/2014    Years since quitting: 10.0   Smokeless tobacco: Never  Vaping Use    Vaping status: Never Used  Substance and Sexual Activity   Alcohol use: Not Currently    Comment: occasionally   Drug use: No   Sexual activity: Not Currently  Other Topics Concern   Not on file  Social History Narrative   Married    Some college, retired    2 daughters , one has bipolar disorder    No guns    Wears seat belt    Safe in relationship    Social Drivers of Corporate investment banker Strain: Low Risk  (04/22/2024)   Overall Financial Resource Strain (CARDIA)    Difficulty of Paying Living Expenses: Not hard at all  Food Insecurity: No Food Insecurity (04/22/2024)   Hunger Vital Sign    Worried About Running Out of Food in the Last Year: Never true    Ran Out of Food in the Last Year: Never true  Transportation Needs: No Transportation Needs (04/22/2024)   PRAPARE - Administrator, Civil Service (Medical): No    Lack of Transportation (Non-Medical): No  Physical Activity: Sufficiently Active (04/22/2024)   Exercise Vital Sign    Days of Exercise per Week: 5 days    Minutes of Exercise per Session: 150+ min  Stress: Stress Concern Present (04/22/2024)   [employer] of Occupational Health - Occupational Stress Questionnaire    Feeling of Stress: Very much  Social Connections: Moderately Integrated (04/22/2024)   Social Connection and Isolation Panel    Frequency of Communication with Friends and Family: More than three times a week    Frequency of Social Gatherings with Friends and Family: Once a week    Attends Religious Services: More than 4 times per year    Active Member of [employer] or Organizations: No    Attends Banker Meetings: Never    Marital  Status: Married    Tobacco Counseling Counseling given: Not Answered    Clinical Intake:  Pre-visit preparation completed: Yes  Pain : No/denies pain     BMI - recorded: 26.96 Nutritional Status: BMI 25 -29 Overweight Nutritional Risks: None Diabetes: Yes CBG done?: Yes (per  patient FBS 95)  Lab Results  Component Value Date   HGBA1C 6.0 01/08/2024   HGBA1C 6.4 07/12/2023   HGBA1C 6.2 (A) 12/28/2022     How often do you need to have someone help you when you read instructions, pamphlets, or other written materials from your doctor or pharmacy?: 1 - Never  Interpreter Needed?: No  Information entered by :: R. Ruta Capece LPN   Activities of Daily Living     04/22/2024   12:59 PM  In your present state of health, do you have any difficulty performing the following activities:  Hearing? 0  Vision? 0  Comment glasses  Difficulty concentrating or making decisions? 0  Walking or climbing stairs? 0  Dressing or bathing? 0  Doing errands, shopping? 0  Preparing Food and eating ? N  Using the Toilet? N  In the past six months, have you accidently leaked urine? N  Do you have problems with loss of bowel control? N  Managing your Medications? N  Managing your Finances? N  Housekeeping or managing your Housekeeping? N    Patient Care Team: Marylynn Verneita CROME, MD as PCP - General (Internal Medicine) Jinny Carmine, MD as Consulting Physician (Gastroenterology)  I have updated your Care Teams any recent Medical Services you may have received from other providers in the past year.     Assessment:   This is a routine wellness examination for Vanessa Herman.  Hearing/Vision screen Hearing Screening - Comments:: No issues Vision Screening - Comments:: glasses   Goals Addressed             This Visit's Progress    Patient Stated       Wants to continue to lose weight        Depression Screen     04/23/2024    9:30 AM 01/10/2024    9:30 AM 07/12/2023   10:12 AM 05/22/2023    3:30 PM 04/11/2023   10:30 AM 12/28/2022    9:47 AM 06/15/2022    9:38 AM  PHQ 2/9 Scores  PHQ - 2 Score 0 0 0 0 0 0 1  PHQ- 9 Score 3  1 0 0      Fall Risk     04/22/2024   12:59 PM 01/10/2024    9:30 AM 07/12/2023   10:12 AM 05/22/2023    3:30 PM 04/11/2023   11:13 AM  Fall Risk    Falls in the past year? 0 0 0 0 0  Number falls in past yr: 0 0 0 0 0  Injury with Fall? 0 0 0 0 0  Risk for fall due to : No Fall Risks No Fall Risks No Fall Risks No Fall Risks No Fall Risks  Follow up Falls evaluation completed;Falls prevention discussed Falls evaluation completed Falls evaluation completed Falls evaluation completed Falls prevention discussed;Falls evaluation completed;Education provided    MEDICARE RISK AT HOME:  Medicare Risk at Home Any stairs in or around the home?: (Patient-Rptd) Yes If so, are there any without handrails?: (Patient-Rptd) No Home free of loose throw rugs in walkways, pet beds, electrical cords, etc?: (Patient-Rptd) Yes Adequate lighting in your home to reduce risk of falls?: (Patient-Rptd) Yes Life alert?: (  Patient-Rptd) No Use of a cane, walker or w/c?: (Patient-Rptd) No Grab bars in the bathroom?: (Patient-Rptd) No Shower chair or bench in shower?: (Patient-Rptd) No Elevated toilet seat or a handicapped toilet?: (Patient-Rptd) No  TIMED UP AND GO:  Was the test performed?  No  Cognitive Function: 6CIT completed        04/23/2024    9:45 AM 04/11/2023   10:44 AM  6CIT Screen  What Year? 0 points 0 points  What month? 0 points 0 points  What time? 0 points 0 points  Count back from 20 0 points 0 points  Months in reverse 0 points 0 points  Repeat phrase 0 points 0 points  Total Score 0 points 0 points    Immunizations Immunization History  Administered Date(s) Administered   Hep A / Hep B 05/21/2018, 06/25/2018, 11/21/2018   Influenza,inj,Quad PF,6+ Mos 07/12/2018, 06/23/2020   Influenza-Unspecified 07/12/2018, 07/18/2019, 06/23/2020   Tdap 07/30/2018    Screening Tests Health Maintenance  Topic Date Due   COVID-19 Vaccine (1) Never done   Pneumococcal Vaccine: 50+ Years (1 of 2 - PCV) Never done   Medicare Annual Wellness (AWV)  06/18/2024   INFLUENZA VACCINE  05/16/2024   MAMMOGRAM  06/18/2024   HEMOGLOBIN A1C   07/10/2024   OPHTHALMOLOGY EXAM  12/17/2024   Diabetic kidney evaluation - eGFR measurement  01/07/2025   Diabetic kidney evaluation - Urine ACR  01/09/2025   FOOT EXAM  01/09/2025   Lung Cancer Screening  01/14/2025   DTaP/Tdap/Td (2 - Td or Tdap) 07/30/2028   Colonoscopy  09/15/2032   Hepatitis B Vaccines  Completed   DEXA SCAN  Completed   Hepatitis C Screening  Completed   HPV VACCINES  Aged Out   Meningococcal B Vaccine  Aged Out   Zoster Vaccines- Shingrix  Discontinued    Health Maintenance  Health Maintenance Due  Topic Date Due   COVID-19 Vaccine (1) Never done   Pneumococcal Vaccine: 50+ Years (1 of 2 - PCV) Never done   Medicare Annual Wellness (AWV)  06/18/2024   Health Maintenance Items Addressed: Patient declines vaccines at this time  Additional Screening:  Vision Screening: Recommended annual ophthalmology exams for early detection of glaucoma and other disorders of the eye. Up to date  [facility] Would you like a referral to an eye doctor? No    Dental Screening: Recommended annual dental exams for proper oral hygiene  Community Resource Referral / Chronic Care Management: CRR required this visit?  No   CCM required this visit?  No   Plan:    I have personally reviewed and noted the following in the patient's chart:   Medical and social history Use of alcohol, tobacco or illicit drugs  Current medications and supplements including opioid prescriptions. Patient is not currently taking opioid prescriptions. Functional ability and status Nutritional status Physical activity Advanced directives List of other physicians Hospitalizations, surgeries, and ER visits in previous 12 months Vitals Screenings to include cognitive, depression, and falls Referrals and appointments  In addition, I have reviewed and discussed with patient certain preventive protocols, quality metrics, and best practice recommendations. A written personalized care plan for  preventive services as well as general preventive health recommendations were provided to patient.   Angeline Fredericks, LPN   2/0/7974   After Visit Summary: (MyChart) Due to this being a telephonic visit, the after visit summary with patients personalized plan was offered to patient via MyChart   Notes: Nothing significant to report  at this time.,

## 2024-04-25 ENCOUNTER — Ambulatory Visit: Admitting: Physical Therapy

## 2024-04-30 ENCOUNTER — Encounter: Admitting: Physical Therapy

## 2024-05-01 ENCOUNTER — Encounter: Payer: Self-pay | Admitting: Internal Medicine

## 2024-05-02 ENCOUNTER — Encounter: Admitting: Physical Therapy

## 2024-05-02 NOTE — Telephone Encounter (Signed)
 Spoke with pharmacy and her insurance is asking for a SWO. Pharmacy is thinking this is some sort of PA so I am sending to the PA team for assistance.

## 2024-05-05 NOTE — Telephone Encounter (Signed)
 Has the PA for the test strips and lancets been submitted.

## 2024-05-06 ENCOUNTER — Encounter

## 2024-05-07 NOTE — Telephone Encounter (Signed)
 Copied from CRM [id number]. Topic: Clinical - Medication Question >> May 06, 2024  4:56 PM Carlyon D wrote: Reason for CRM: Pt is calling in regards to medication.   Accu-Chek Softclix Lancets lancets and test strips.  Pt is stating a medicare from needs to be filled. They need to know why she needs these meds. Pt would like a call back.

## 2024-05-07 NOTE — Telephone Encounter (Signed)
 Pt is needing to get her diabetes supplies filled. Has the PA for this been submitted?

## 2024-05-08 ENCOUNTER — Encounter

## 2024-05-13 ENCOUNTER — Encounter

## 2024-05-13 ENCOUNTER — Other Ambulatory Visit (HOSPITAL_COMMUNITY): Payer: Self-pay

## 2024-05-13 ENCOUNTER — Telehealth

## 2024-05-13 NOTE — Telephone Encounter (Signed)
 Pharmacy Patient Advocate Encounter  Received notification from [employer] that Prior Authorization for Accu-Chek Guide Test Strips has been DENIED.  See denial reason below. No denial letter attached in CMM. Will attach denial letter to Media tab once received.  ACCU-CHEK GUIDE TEST Strip is a medical supply. It is not directly related to the delivery of insulin to the body. Medicare Part B covers medical supplies that are not directly related to the delivery of insulin to the body. Test strips, lancets, and needle disposal systems are included in this category. They are covered by Medicare Part B. We cannot pay for drugs under Medicare Part D if they are covered under Medicare Part A or B.   Patient will need to have the pharmacy run their testing supplies under their Medicare Part A&B. The office may need to fill out a CMN (Certificate of Medical Necessity) Form in order for Medicare A&B to cover.   PA #/Case ID/Reference #: [id number]   Morene Potters, CPhT Supervisor Pharmacy Patient Advocate [facility] [phone] (Ph) 05/13/2024 11:19 AM

## 2024-05-13 NOTE — Telephone Encounter (Signed)
 Called and was able to get the script authorized for qty 100 for a 90 day supply. Called [facility] an pharmacist stated that it was approved. Called and informed pt that it was approved

## 2024-05-13 NOTE — Telephone Encounter (Signed)
 Pharmacy Patient Advocate Encounter   Received notification from Patient Advice Request messages that prior authorization for Accu-Chek Guide Test Strips is required/requested.   Insurance verification completed.    The patient is insured through [facility] .   Per test claim: PA required and submitted KEY/EOC/Request #: [id number] Pending Response from Payer   Morene Potters, CPhT Supervisor Pharmacy Patient Advocate [facility] [phone] (Ph) 05/13/2024 10:32 AM

## 2024-05-13 NOTE — Telephone Encounter (Signed)
 Called pharmacy to see if they are running the medical supplies under medicare part A&B and the pharmacist stated they were and that a medical necessity form should have been sent.he did provide me with the phone number to call to speak to someone in regards to receiving a form [phone]. I called the number provided and was on hold for 20 minutes and the line disconnected will try again later

## 2024-05-20 ENCOUNTER — Encounter

## 2024-05-27 ENCOUNTER — Encounter

## 2024-06-03 ENCOUNTER — Encounter

## 2024-06-10 ENCOUNTER — Encounter

## 2024-06-17 ENCOUNTER — Encounter

## 2024-06-23 ENCOUNTER — Ambulatory Visit

## 2024-06-23 ENCOUNTER — Other Ambulatory Visit: Payer: Self-pay | Admitting: Internal Medicine

## 2024-06-23 DIAGNOSIS — Z1231 Encounter for screening mammogram for malignant neoplasm of breast: Secondary | ICD-10-CM

## 2024-06-24 ENCOUNTER — Encounter

## 2024-06-24 ENCOUNTER — Ambulatory Visit
Admission: RE | Admit: 2024-06-24 | Discharge: 2024-06-24 | Disposition: A | Discharge status: Home / Self Care | Source: Ambulatory Visit | Attending: Internal Medicine | Admitting: Internal Medicine

## 2024-06-24 ENCOUNTER — Other Ambulatory Visit: Payer: Self-pay

## 2024-06-24 DIAGNOSIS — Z1231 Encounter for screening mammogram for malignant neoplasm of breast: Secondary | ICD-10-CM | POA: Insufficient documentation

## 2024-06-24 MED ORDER — METFORMIN HCL 500 MG PO TABS: 500.0000 mg | ORAL_TABLET | Freq: Two times a day (BID) | ORAL | 2 refills | Status: AC

## 2024-07-01 ENCOUNTER — Encounter

## 2024-07-06 ENCOUNTER — Other Ambulatory Visit: Payer: Self-pay | Admitting: Internal Medicine

## 2024-07-06 DIAGNOSIS — R7303 Prediabetes: Secondary | ICD-10-CM

## 2024-07-06 DIAGNOSIS — I10 Essential (primary) hypertension: Secondary | ICD-10-CM

## 2024-07-09 ENCOUNTER — Other Ambulatory Visit: Payer: Self-pay

## 2024-07-09 MED ORDER — METOPROLOL SUCCINATE ER 50 MG PO TB24: 50.0000 mg | ORAL_TABLET | Freq: Every evening | ORAL | 1 refills | Status: AC

## 2024-07-10 ENCOUNTER — Encounter: Payer: Self-pay | Admitting: Internal Medicine

## 2024-07-10 ENCOUNTER — Ambulatory Visit: Admitting: Internal Medicine

## 2024-07-10 VITALS — BP 140/84 | HR 73 | Ht <= 58 in | Wt 133.6 lb

## 2024-07-10 DIAGNOSIS — E1159 Type 2 diabetes mellitus with other circulatory complications: Secondary | ICD-10-CM | POA: Diagnosis not present

## 2024-07-10 DIAGNOSIS — I10 Essential (primary) hypertension: Secondary | ICD-10-CM

## 2024-07-10 DIAGNOSIS — E782 Mixed hyperlipidemia: Secondary | ICD-10-CM

## 2024-07-10 DIAGNOSIS — T466X5A Adverse effect of antihyperlipidemic and antiarteriosclerotic drugs, initial encounter: Secondary | ICD-10-CM | POA: Diagnosis not present

## 2024-07-10 DIAGNOSIS — M791 Myalgia, unspecified site: Secondary | ICD-10-CM | POA: Diagnosis not present

## 2024-07-10 DIAGNOSIS — Z7984 Long term (current) use of oral hypoglycemic drugs: Secondary | ICD-10-CM

## 2024-07-10 DIAGNOSIS — I152 Hypertension secondary to endocrine disorders: Secondary | ICD-10-CM

## 2024-07-10 DIAGNOSIS — E119 Type 2 diabetes mellitus without complications: Secondary | ICD-10-CM

## 2024-07-10 DIAGNOSIS — M810 Age-related osteoporosis without current pathological fracture: Secondary | ICD-10-CM

## 2024-07-10 DIAGNOSIS — R1011 Right upper quadrant pain: Secondary | ICD-10-CM

## 2024-07-10 LAB — HEMOGLOBIN A1C: Hgb A1c MFr Bld: 6.4 % (ref 4.6–6.5)

## 2024-07-10 LAB — COMPREHENSIVE METABOLIC PANEL WITH GFR
ALT: 39 U/L — ABNORMAL HIGH (ref 0–35)
AST: 38 U/L — ABNORMAL HIGH (ref 0–37)
Albumin: 4.7 g/dL (ref 3.5–5.2)
Alkaline Phosphatase: 51 U/L (ref 39–117)
BUN: 10 mg/dL (ref 6–23)
CO2: 27 meq/L (ref 19–32)
Calcium: 9.7 mg/dL (ref 8.4–10.5)
Chloride: 99 meq/L (ref 96–112)
Creatinine, Ser: 0.7 mg/dL (ref 0.40–1.20)
GFR: 88.59 mL/min (ref >60.00)
Glucose, Bld: 93 mg/dL (ref 70–99)
Potassium: 4.3 meq/L (ref 3.5–5.1)
Sodium: 137 meq/L (ref 135–145)
Total Bilirubin: 0.7 mg/dL (ref 0.2–1.2)
Total Protein: 6.8 g/dL (ref 6.0–8.3)

## 2024-07-10 LAB — LIPID PANEL
Cholesterol: 186 mg/dL (ref 0–200)
HDL: 61.5 mg/dL (ref >39.00)
LDL Cholesterol: 96 mg/dL (ref 0–99)
NonHDL: 124.94
Total CHOL/HDL Ratio: 3
Triglycerides: 145 mg/dL (ref 0.0–149.0)
VLDL: 29 mg/dL (ref 0.0–40.0)

## 2024-07-10 LAB — LDL CHOLESTEROL, DIRECT: Direct LDL: 124 mg/dL

## 2024-07-10 LAB — TSH: TSH: 1.77 u[IU]/mL (ref 0.35–5.50)

## 2024-07-10 NOTE — Patient Instructions (Addendum)
 I have ordered an ultrasound of your liver and gallbladder  . If you are not contacted within 2 weeks to schedule in [place]. Let  me know  If there are no gallstones,  we will discuss starting Mounjaro  (Zepbound by another name)  Please consider getting vaccinated against pneumonia    You might want to consider starting your day with a premixed protein drink called Premier Protein shake in the morning.instead of skipping breakfast.  It is less $$$ and very low sugar.    160 cal  30 g protein  1 g sugar 50% calcium  needs

## 2024-07-10 NOTE — Assessment & Plan Note (Signed)
 Multiple trials of statins have been stopped  due to myalgias and pain .

## 2024-07-10 NOTE — Progress Notes (Unsigned)
 Subjective:  Patient ID: Vanessa Herman, female    DOB: 12-14-1955  Age: 68 y.o. MRN: [id number]  CC: There were no encounter diagnoses.   HPI Vanessa Herman presents for  Chief Complaint  Patient presents with  . Medical Management of Chronic Issues    6 month follow up    TYPE 2; :  She  feels generally well,  But is not  exercising regularly due to caregiver burden . Wants to lose weight but gaining due to snacking Checking  blood sugars less  once daily in the morning ,  and occasionally has  a hypoglycemic symptoms at readings of 86 . .  BS have been under 130 fasting .    Denies any recent hypoglyemic events.  Taking   medications as directed. Following a carbohydrate modified diet 6 days per week. Denies numbness, burning and tingling of extremities. Appetite is good.   Caregiver fatigue:  HUSBAND IS NO LONGER DRIVING DUE TO HIS DEMENTIA affecting his decision making  . She is averaging 6 hours of sleep and tired all the time. Vanessa Herman   He has days of incresed activity and days where he sleeps all day.  Daughter Vanessa Herman is supportive    3) She has been having : re current episodes  of RUQ pain for the past month,  under right breast,  described as a burning,  not sharp or crampy.  Lasts a afew minutes  resolves spontaneously , no nausea.  No tea  colored urine . Stools have been brown   4)Hypertension: patient checks blood pressure twice weekly at home.  Readings have been for the most part <130/80 at rest . Patient is following a reduced salt diet most days and is taking medications as prescribed (metoprolol  and lisinopril )    Outpatient Medications Prior to Visit  Medication Sig Dispense Refill  . Accu-Chek Softclix Lancets lancets Use to check blood sugare once daily.  e11.59 200 each 4  . Ascorbic Acid (VITAMIN C DROPS MT) Take 500 mg by mouth as needed.    . Berberine Chloride (BERBERINE HCI PO) Take by mouth every other day.    . blood glucose meter kit and supplies Dispense  based on patient and insurance preference. Use up to four times daily as directed. (FOR ICD-10 E10.9, E11.9). 1 each 0  . calcium  carbonate (OSCAL) 1500 (600 Ca) MG TABS tablet Take by mouth daily with breakfast.    . Cholecalciferol (VITAMIN D ) 2000 units CAPS Vitamin D3 2,000 unit capsule   1 capsule every day by oral route. (Patient taking differently: Take 2,000 Units by mouth in the morning and at bedtime.)    . clobetasol ointment (TEMOVATE) 0.05 % Apply topically.    . Coenzyme Q10 (CO Q 10) 100 MG CAPS Take 1 capsule by mouth 3 (three) times a week.    . diazepam  (VALIUM ) 5 MG tablet Take 1 tablet (5 mg total) by mouth daily as needed for anxiety. 30 tablet 2  . GARLIC PO Take by mouth 2 (two) times daily with a meal.    . glucose blood (ACCU-CHEK GUIDE) test strip Use to check blood sugar once daily.  e11.59 200 each 4  . lisinopril  (ZESTRIL ) 20 MG tablet TAKE 1 TABLET(20 MG) BY MOUTH DAILY 90 tablet 1  . MAGNESIUM COMPLEX HIGH POTENCY PO Take by mouth every other day.    . Menaquinone-7 (VITAMIN K2 PO) Take by mouth every other day.    . metFORMIN  (GLUCOPHAGE )  500 MG tablet Take 1 tablet (500 mg total) by mouth 2 (two) times daily with a meal. 180 tablet 2  . metFORMIN  (GLUCOPHAGE -XR) 500 MG 24 hr tablet TAKE 2 TABLETS(1000 MG) BY MOUTH DAILY WITH BREAKFAST 180 tablet 1  . metoprolol  succinate (TOPROL -XL) 50 MG 24 hr tablet Take 1 tablet (50 mg total) by mouth every evening. 90 tablet 1  . milk thistle 175 MG tablet Take 175 mg by mouth every other day.    . Multiple Vitamin (MULTIVITAMIN) tablet Take 1 tablet by mouth every other day.     . Zinc 50 MG TABS Take 50 mg by mouth as needed.    . Acetylcysteine (NAC PO) Take by mouth every other day. (Patient not taking: Reported on 04/23/2024)    . aspirin 81 MG tablet Take 81 mg by mouth every other day.  (Patient not taking: Reported on 04/23/2024)    . UNABLE TO FIND every other day. Med Name: Dandilion Root     No facility-administered  medications prior to visit.    Review of Systems;  Patient denies headache, fevers, malaise, unintentional weight loss, skin rash, eye pain, sinus congestion and sinus pain, sore throat, dysphagia,  hemoptysis , cough, dyspnea, wheezing, chest pain, palpitations, orthopnea, edema, abdominal pain, nausea, melena, diarrhea, constipation, flank pain, dysuria, hematuria, urinary  Frequency, nocturia, numbness, tingling, seizures,  Focal weakness, Loss of consciousness,  Tremor, insomnia, depression, anxiety, and suicidal ideation.      Objective:  BP (!) 140/84   Pulse 73   Ht 4' 10 (1.473 m)   Wt 133 lb 9.6 oz (60.6 kg)   SpO2 98%   BMI 27.92 kg/m   BP Readings from Last 3 Encounters:  07/10/24 (!) 140/84  01/10/24 134/86  08/07/23 (!) 140/80    Wt Readings from Last 3 Encounters:  07/10/24 133 lb 9.6 oz (60.6 kg)  04/23/24 129 lb (58.5 kg)  01/10/24 130 lb 12.8 oz (59.3 kg)    Physical Exam Vitals reviewed.  Constitutional:      General: She is not in acute distress.    Appearance: Normal appearance. She is normal weight. She is not ill-appearing, toxic-appearing or diaphoretic.  HENT:     Head: Normocephalic.  Eyes:     General: No scleral icterus.       Right eye: No discharge.        Left eye: No discharge.     Conjunctiva/sclera: Conjunctivae normal.  Cardiovascular:     Rate and Rhythm: Normal rate and regular rhythm.     Heart sounds: Normal heart sounds.  Pulmonary:     Effort: Pulmonary effort is normal. No respiratory distress.     Breath sounds: Normal breath sounds.  Musculoskeletal:        General: Normal range of motion.  Skin:    General: Skin is warm and dry.  Neurological:     General: No focal deficit present.     Mental Status: She is alert and oriented to person, place, and time. Mental status is at baseline.  Psychiatric:        Mood and Affect: Mood normal.        Behavior: Behavior normal.        Thought Content: Thought content normal.         Judgment: Judgment normal.    Lab Results  Component Value Date   HGBA1C 6.0 01/08/2024   HGBA1C 6.4 07/12/2023   HGBA1C 6.2 (A) 12/28/2022    Lab  Results  Component Value Date   CREATININE 0.73 01/08/2024   CREATININE 0.73 07/12/2023   CREATININE 0.78 12/28/2022    Lab Results  Component Value Date   WBC 6.9 12/28/2022   HGB 15.0 12/28/2022   HCT 44.5 12/28/2022   PLT 302.0 12/28/2022   GLUCOSE 101 (H) 01/08/2024   CHOL 184 01/08/2024   TRIG 121 01/08/2024   HDL 64 01/08/2024   LDLDIRECT 56.0 07/12/2023   LDLCALC 98 01/08/2024   ALT 33 01/08/2024   AST 29 01/08/2024   NA 136 01/08/2024   K 4.5 01/08/2024   CL 98 01/08/2024   CREATININE 0.73 01/08/2024   BUN 13 01/08/2024   CO2 29 01/08/2024   TSH 1.55 12/28/2022   HGBA1C 6.0 01/08/2024   MICROALBUR 0.0 01/10/2024    MM 3D SCREENING MAMMOGRAM BILATERAL BREAST Result Date: 06/24/2024 CLINICAL DATA:  Screening. EXAM: DIGITAL SCREENING BILATERAL MAMMOGRAM WITH TOMOSYNTHESIS AND CAD TECHNIQUE: Bilateral screening digital craniocaudal and mediolateral oblique mammograms were obtained. Bilateral screening digital breast tomosynthesis was performed. The images were evaluated with computer-aided detection. COMPARISON:  Previous exam(s). ACR Breast Density Category b: There are scattered areas of fibroglandular density. FINDINGS: There are no findings suspicious for malignancy. IMPRESSION: No mammographic evidence of malignancy. A result letter of this screening mammogram will be mailed directly to the patient. RECOMMENDATION: Screening mammogram in one year. (Code:SM-B-01Y) BI-RADS CATEGORY  1: Negative. Electronically Signed   By: Norleen Croak M.D.   On: 06/24/2024 15:00    Assessment & Plan:  .There are no diagnoses linked to this encounter.   I spent 34 minutes on the day of this face to face encounter reviewing patient's  most recent visit with cardiology,  nephrology,  and neurology,  prior relevant surgical and  non surgical procedures, recent  labs and imaging studies, counseling on weight management,  reviewing the assessment and plan with patient, and post visit ordering and reviewing of  diagnostics and therapeutics with patient  .   Follow-up: No follow-ups on file.   Verneita LITTIE Kettering, MD

## 2024-07-12 ENCOUNTER — Ambulatory Visit: Payer: Self-pay | Admitting: Internal Medicine

## 2024-07-12 NOTE — Assessment & Plan Note (Addendum)
 she reports compliance with medication regimen  of metoprolol  and lisinopril .   but has an elevated reading today in office.  Her home readings are lower  Lab Results  Component Value Date   LABMICR Comment 05/07/2018   MICROALBUR 0.0 01/10/2024   MICROALBUR <0.2 12/29/2021     Lab Results  Component Value Date   NA 137 07/10/2024   K 4.3 07/10/2024   CL 99 07/10/2024   CO2 27 07/10/2024   Lab Results  Component Value Date   CREATININE 0.70 07/10/2024

## 2024-07-13 NOTE — Assessment & Plan Note (Signed)
 LDL is not at goal since stopping atorvastatin .   advised to consider  Zetia  Lab Results  Component Value Date   CHOL 186 07/10/2024   HDL 61.50 07/10/2024   LDLCALC 96 07/10/2024   LDLDIRECT 124.0 07/10/2024   TRIG 145.0 07/10/2024   CHOLHDL 3 07/10/2024

## 2024-07-13 NOTE — Assessment & Plan Note (Signed)
 Remains  well-controlled on metformin  .  A1c has been consistently at or  less than 7.0.since 2021, Patient is up-to-date on eye exams .   She has stopped taking a statin and defers retarting .  She is taking  an ACE inhibitor.  Lab Results  Component Value Date   HGBA1C 6.4 07/10/2024   Lab Results  Component Value Date   LABMICR Comment 05/07/2018   MICROALBUR 0.0 01/10/2024   MICROALBUR <0.2 12/29/2021      Lab Results  Component Value Date   CHOL 186 07/10/2024   HDL 61.50 07/10/2024   LDLCALC 96 07/10/2024   LDLDIRECT 124.0 07/10/2024   TRIG 145.0 07/10/2024   CHOLHDL 3 07/10/2024

## 2024-07-13 NOTE — Assessment & Plan Note (Signed)
 she reports compliance with medication regimen  of metoprolol  and lisinopril .   but has an elevated reading today in office.  Her home readings are lower  Lab Results  Component Value Date   LABMICR Comment 05/07/2018   MICROALBUR 0.0 01/10/2024   MICROALBUR <0.2 12/29/2021     Lab Results  Component Value Date   NA 137 07/10/2024   K 4.3 07/10/2024   CL 99 07/10/2024   CO2 27 07/10/2024   Lab Results  Component Value Date   CREATININE 0.70 07/10/2024

## 2024-07-13 NOTE — Assessment & Plan Note (Signed)
 Improving by 2024 DEXA , repeat in 2029.

## 2024-07-14 ENCOUNTER — Telehealth: Payer: Self-pay

## 2024-07-14 NOTE — Telephone Encounter (Signed)
 Please schedule pt for a nurse visit to receive her prevnar 20 pneumococcal vaccine.

## 2024-07-14 NOTE — Telephone Encounter (Signed)
 Copied from CRM #[id number]. Topic: Appointments - Scheduling Inquiry for Clinic >> Jul 14, 2024  9:17 AM Laymon HERO wrote: Reason for CRM: Patient will be coming into town tomorrow and wanting to know if she can get the vaccine Pneumococcal. Please reach out to patient to schedule

## 2024-07-16 ENCOUNTER — Ambulatory Visit: Payer: Self-pay | Admitting: Internal Medicine

## 2024-07-16 ENCOUNTER — Ambulatory Visit
Admission: RE | Admit: 2024-07-16 | Discharge: 2024-07-16 | Disposition: A | Discharge status: Home / Self Care | Source: Ambulatory Visit | Attending: Internal Medicine | Admitting: Internal Medicine

## 2024-07-16 DIAGNOSIS — R1011 Right upper quadrant pain: Secondary | ICD-10-CM | POA: Insufficient documentation

## 2024-07-23 ENCOUNTER — Ambulatory Visit

## 2024-07-23 DIAGNOSIS — Z23 Encounter for immunization: Secondary | ICD-10-CM | POA: Diagnosis not present

## 2024-07-23 NOTE — Progress Notes (Signed)
 Patient was administered a pneumonia vaccine into her right deltoid per Dr. Lula AVS on 07/10/24. Patient tolerated the pneumonia vaccine well.

## 2024-08-08 ENCOUNTER — Telehealth: Payer: Self-pay

## 2024-08-08 DIAGNOSIS — E119 Type 2 diabetes mellitus without complications: Secondary | ICD-10-CM

## 2024-08-08 DIAGNOSIS — I10 Essential (primary) hypertension: Secondary | ICD-10-CM

## 2024-08-08 DIAGNOSIS — E782 Mixed hyperlipidemia: Secondary | ICD-10-CM

## 2024-08-08 NOTE — Telephone Encounter (Signed)
 Copied from CRM [id number]. Topic: Clinical - Request for Lab/Test Order >> Aug 08, 2024  3:50 PM Vanessa Herman wrote: Reason for CRM: Patient scheduled her annual physical for April 10th and would like to have labs done prior, no active orders on file.

## 2024-08-11 ENCOUNTER — Other Ambulatory Visit: Payer: Self-pay | Admitting: Internal Medicine

## 2024-08-11 DIAGNOSIS — E119 Type 2 diabetes mellitus without complications: Secondary | ICD-10-CM

## 2024-08-15 ENCOUNTER — Ambulatory Visit: Attending: Cardiovascular Disease | Admitting: Cardiovascular Disease

## 2024-08-15 ENCOUNTER — Encounter: Payer: Self-pay | Admitting: Cardiovascular Disease

## 2024-08-15 VITALS — BP 140/82 | HR 66 | Ht <= 58 in | Wt 136.0 lb

## 2024-08-15 DIAGNOSIS — I700 Atherosclerosis of aorta: Secondary | ICD-10-CM | POA: Diagnosis present

## 2024-08-15 DIAGNOSIS — E782 Mixed hyperlipidemia: Secondary | ICD-10-CM | POA: Diagnosis present

## 2024-08-15 DIAGNOSIS — I2510 Atherosclerotic heart disease of native coronary artery without angina pectoris: Secondary | ICD-10-CM | POA: Insufficient documentation

## 2024-08-15 DIAGNOSIS — E119 Type 2 diabetes mellitus without complications: Secondary | ICD-10-CM | POA: Diagnosis present

## 2024-08-15 DIAGNOSIS — I10 Essential (primary) hypertension: Secondary | ICD-10-CM | POA: Diagnosis present

## 2024-08-15 MED ORDER — EZETIMIBE 10 MG PO TABS: 10.0000 mg | ORAL_TABLET | Freq: Every day | ORAL | 3 refills | Status: AC

## 2024-08-15 NOTE — Patient Instructions (Signed)
 Medication Instructions:  ?Please start zetia 10 mg daily  ? ?If you need a refill on your cardiac medications before your next appointment, please call your pharmacy.  ? ?Lab work: ?No new labs needed ? ?Testing/Procedures: ?No new testing needed ? ?Follow-Up: ?At [facility], you and your health needs are our priority.  As part of our continuing mission to provide you with exceptional heart care, we have created designated Provider Care Teams.  These Care Teams include your primary Cardiologist (physician) and Advanced Practice Providers (APPs -  Physician Assistants and Nurse Practitioners) who all work together to provide you with the care you need, when you need it. ? ?You will need a follow up appointment in 12 months ? ?Providers on your designated Care Team:   ?Nicolasa Ducking, NP ?Eula Listen, PA-C ?Cadence Fransico Michael, PA-C ? ?COVID-19 Vaccine Information can be found at: [email or web address] For questions related to vaccine distribution or appointments, please email [email or web address] or call [phone].  ? ?

## 2024-08-15 NOTE — Progress Notes (Signed)
 Cardiology Office Note  Date:  08/15/2024   ID:  Modena, Bellemare [birth year], MRN [id number]  PCP:  Marylynn Verneita CROME, MD   Chief Complaint  Patient presents with   12 month follow up     Doing well.     HPI:  [name] is a 68 year old woman with past medical history of  coronary calcification, aortic atherosclerosis on CT scan 2019 Aortic atherosclerosis Mild in the arch,  Diabetes type 2 Hypertension Obesity Stopped smoking 5 yrs ago. Echo November 2020 EF 60 to 65% Presents for f/u of her palpitations and family history of coronary disease.  Aortic atherosclerosis and coronary calcification  Last seen by myself in clinic October 2024 In follow-up today reports significant stress at home Continues to take care of husband with dementia,  In the past was taking diazepam  as needed  Denies significant tachycardia or palpitations No regular exercise program  Previously took herself off simvastatin  Willing to try Zetia  CT scan April 2025 with coronary calcifications, aortic atherosclerosis Images pulled up and reviewed with her on today's visit  On metoprolol  succinate 50 at night and lisinopril  20  Labs reviewed  total chol 124, LDL 56, now up to 186, LDL 96 HBA1C 6.4,   EKG personally reviewed by myself on todays visit EKG Interpretation Date/Time:  Friday August 15 2024 09:19:27 EDT Ventricular Rate:  66 PR Interval:  118 QRS Duration:  84 QT Interval:  404 QTC Calculation: 423 R Axis:   -2  Text Interpretation: Normal sinus rhythm Possible Left atrial enlargement When compared with ECG of 07-Aug-2023 09:08, No significant change was found Confirmed by Perla Lye [phone]) on 08/15/2024 9:40:56 AM   Echo 08/2019  EF normal  FH dad and both grandparents heart dz,   Prior CT scan August 2019 Showing coronary calcification, aortic atherosclerosis  Prior carotid ultrasound August 2019 Minor carotid atherosclerosis less than 50%  bilaterally   PMH:   has a past medical history of Allergy (?), Anxiety, Chicken pox, Collagenous colitis, Colon polyps, COVID-19, DDD (degenerative disc disease), cervical, Depression, Diabetes mellitus without complication (HCC), Fatty liver, Ganglion cyst of wrist, left, Hemorrhoids, Hemorrhoids, History of colon polyps, History of prediabetes, Hyperlipidemia (07/06/2015), Hypertension, IBS (irritable bowel syndrome) (07/06/2015), Lichen sclerosus, NS (neck stiffness), Polycythemia, secondary (07/06/2015), Prediabetes, SCC (squamous cell carcinoma), Urinary incontinence, Vertigo, and Vitamin D  deficiency (07/06/2015).  PSH:    Past Surgical History:  Procedure Laterality Date   BREAST BIOPSY Left 06/30/2021   US  bx, coil marker, BENIGN MAMMARY PARENCHYMA WITH MILD STROMAL FIBROSIS. - NEGATIVE FOR ATYPICAL PROLIFERATIVE   COLONOSCOPY  2006   COLONOSCOPY WITH PROPOFOL  N/A 08/12/2015   Procedure: COLONOSCOPY WITH PROPOFOL ;  Surgeon: Rogelia Copping, MD;  Location: [facility];  Service: Endoscopy;  Laterality: N/A;  PT WOULD LIKE EARLY AM PER JS   COLONOSCOPY WITH PROPOFOL  N/A 09/15/2022   Procedure: COLONOSCOPY WITH PROPOFOL ;  Surgeon: Copping Rogelia, MD;  Location: [facility];  Service: Endoscopy;  Laterality: N/A;   EYE SURGERY  05/17/2020  &  06/07/2020   right eye 05/17/20 and 06/07/20 Dr. Royetta my eye MD f/u 06/2020 cataract surgery narrow angles   HYSTEROSALPINGOGRAM     1983   TONSILLECTOMY AND ADENOIDECTOMY     age [age]    Current Outpatient Medications  Medication Sig Dispense Refill   Accu-Chek Softclix Lancets lancets Use to check blood sugare once daily.  e11.59 200 each 4   Ascorbic Acid (VITAMIN C DROPS MT) Take  500 mg by mouth as needed.     Berberine Chloride (BERBERINE HCI PO) Take by mouth every other day.     blood glucose meter kit and supplies Dispense based on patient and insurance preference. Use up to four times daily as directed. (FOR ICD-10 E10.9, E11.9). 1  each 0   Cholecalciferol (VITAMIN D ) 2000 units CAPS Vitamin D3 2,000 unit capsule   1 capsule every day by oral route.     clobetasol ointment (TEMOVATE) 0.05 % Apply topically.     Coenzyme Q10 (CO Q 10) 100 MG CAPS Take 1 capsule by mouth 3 (three) times a week.     diazepam  (VALIUM ) 5 MG tablet Take 1 tablet (5 mg total) by mouth daily as needed for anxiety. 30 tablet 2   GARLIC PO Take by mouth 2 (two) times daily with a meal.     glucose blood (ACCU-CHEK GUIDE TEST) test strip USE TO CHECK BLOOD SUGAR ONCE DAILY. 200 strip 0   lisinopril  (ZESTRIL ) 20 MG tablet TAKE 1 TABLET(20 MG) BY MOUTH DAILY 90 tablet 1   MAGNESIUM COMPLEX HIGH POTENCY PO Take by mouth every other day. (Patient taking differently: Take by mouth daily.)     Menaquinone-7 (VITAMIN K2 PO) Take by mouth every other day. (Patient taking differently: Take by mouth daily.)     metFORMIN  (GLUCOPHAGE ) 500 MG tablet Take 1 tablet (500 mg total) by mouth 2 (two) times daily with a meal. 180 tablet 2   metFORMIN  (GLUCOPHAGE -XR) 500 MG 24 hr tablet TAKE 2 TABLETS(1000 MG) BY MOUTH DAILY WITH BREAKFAST 180 tablet 1   metoprolol  succinate (TOPROL -XL) 50 MG 24 hr tablet Take 1 tablet (50 mg total) by mouth every evening. 90 tablet 1   Multiple Vitamin (MULTIVITAMIN) tablet Take 1 tablet by mouth every other day.      Zinc 50 MG TABS Take 50 mg by mouth as needed.     calcium  carbonate (OSCAL) 1500 (600 Ca) MG TABS tablet Take by mouth daily with breakfast. (Patient not taking: Reported on 08/15/2024)     No current facility-administered medications for this visit.     Allergies:   Adhesive [tape], Atorvastatin , Tilactase, Neomycin, Pollen extract, Crestor  [rosuvastatin ], Levaquin [levofloxacin in d5w], and Collagen   Social History:  The patient  reports that she quit smoking about 10 years ago. Her smoking use included cigarettes. She started smoking about 52 years ago. She has a 54.6 pack-year smoking history. She has never used  smokeless tobacco. She reports that she does not currently use alcohol. She reports that she does not use drugs.   Family History:   family history includes ADD / ADHD in her daughter and daughter; Alcohol abuse in her daughter; Arthritis in her father; Breast cancer (age of onset: 69) in her maternal grandmother; COPD in her father; Cancer in her maternal grandmother; Diabetes in her mother and paternal grandmother; Hearing loss in her father; Heart attack in her father, maternal grandfather, and paternal grandfather; Heart disease in her father, maternal grandfather, paternal grandfather, and paternal grandmother; Hyperlipidemia in her father and mother; Hypertension in her father and paternal grandmother; Osteoporosis in an other family member; Stroke in her mother and paternal grandmother; Thyroid  disease in her daughter.    Review of Systems: Review of Systems  Constitutional: Negative.   HENT: Negative.    Respiratory: Negative.    Cardiovascular: Negative.   Gastrointestinal: Negative.   Musculoskeletal: Negative.   Neurological: Negative.   Psychiatric/Behavioral: Negative.  All other systems reviewed and are negative.   PHYSICAL EXAM: VS:  BP (!) 140/82 (BP Location: Left Arm, Patient Position: Sitting, Cuff Size: Normal)   Pulse 66   Ht 4' 10 (1.473 m)   Wt 136 lb (61.7 kg)   SpO2 98%   BMI 28.42 kg/m  , BMI Body mass index is 28.42 kg/m. Constitutional:  oriented to person, place, and time. No distress.  HENT:  Head: Grossly normal Eyes:  no discharge. No scleral icterus.  Neck: No JVD, no carotid bruits  Cardiovascular: Regular rate and rhythm, no murmurs appreciated Pulmonary/Chest: Clear to auscultation bilaterally, no wheezes or rails Abdominal: Soft.  no distension.  no tenderness.  Musculoskeletal: Normal range of motion Neurological:  normal muscle tone. Coordination normal. No atrophy Skin: Skin warm and dry Psychiatric: normal affect, pleasant  Recent  Labs: 07/10/2024: ALT 39; BUN 10; Creatinine, Ser 0.70; Potassium 4.3; Sodium 137; TSH 1.77    Lipid Panel Lab Results  Component Value Date   CHOL 186 07/10/2024   HDL 61.50 07/10/2024   LDLCALC 96 07/10/2024   TRIG 145.0 07/10/2024      Wt Readings from Last 3 Encounters:  08/15/24 136 lb (61.7 kg)  07/10/24 133 lb 9.6 oz (60.6 kg)  04/23/24 129 lb (58.5 kg)     ASSESSMENT AND PLAN:  Aortic atherosclerosis (HCC) Mild in the arch, and coronary calcification Stopped smoking >5 yrs ago -Took herself off simvastatin , prefers not to restart -Willing to try Zetia 10 mg daily A1c 6.4, reports her weight is higher  Coronary artery calcification seen on CAT scan mild disease, LAD and LCX CT scan images pulled up and reviewed She will start Zetia 10 mg daily Weight running higher she reports  Essential hypertension Blood pressure is well controlled on today's visit. No changes made to the medications.  Controlled type 2 diabetes mellitus without complication, without long-term current use of insulin (HCC) A1c stable 6.4  Palpitations Controlled with metoprolol  Stable  Nonspecific chest pain No chest pain sx No further workup needed at this time  Adjustment disorder Discussed husband who has dementia, adding to her stress at home She does have time to get out for herself    Orders Placed This Encounter  Procedures   EKG 12-Lead     Signed, Velinda Lunger, M.D., Ph.D. 08/15/2024  [facility] [place], [place] [phone]

## 2024-08-19 ENCOUNTER — Other Ambulatory Visit: Payer: Self-pay

## 2024-08-19 DIAGNOSIS — E119 Type 2 diabetes mellitus without complications: Secondary | ICD-10-CM

## 2024-08-19 MED ORDER — ACCU-CHEK SOFTCLIX LANCETS MISC: 4 refills | Status: DC

## 2024-10-19 ENCOUNTER — Encounter: Payer: Self-pay | Admitting: Internal Medicine

## 2024-10-19 DIAGNOSIS — E119 Type 2 diabetes mellitus without complications: Secondary | ICD-10-CM

## 2024-10-22 NOTE — Telephone Encounter (Signed)
 Attempted to call pharmacy. Was placed on hold until I had to take care of a pt. Will try again later.

## 2024-10-24 ENCOUNTER — Other Ambulatory Visit: Payer: Self-pay

## 2024-10-24 ENCOUNTER — Other Ambulatory Visit: Payer: Self-pay | Admitting: Internal Medicine

## 2024-10-24 DIAGNOSIS — E119 Type 2 diabetes mellitus without complications: Secondary | ICD-10-CM

## 2024-10-24 MED ORDER — ACCU-CHEK SOFTCLIX LANCETS MISC: 4 refills | Status: AC

## 2024-10-24 MED ORDER — BLOOD GLUCOSE METER KIT: PACK | 0 refills | Status: AC

## 2024-10-28 ENCOUNTER — Other Ambulatory Visit: Payer: Self-pay | Admitting: Internal Medicine

## 2024-10-28 NOTE — Telephone Encounter (Signed)
 LVM to inform pt that referral to [facility] is in progress. Please relay message when pt calls back

## 2024-10-28 NOTE — Telephone Encounter (Signed)
 Copied from CRM [id number]. Topic: Clinical - Medication Refill >> Oct 28, 2024 11:42 AM Emylou G wrote: Medication: blood glucose meter kit and supplies  Has the patient contacted their pharmacy? Yes (Agent: If no, request that the patient contact the pharmacy for the refill. If patient does not wish to contact the pharmacy document the reason why and proceed with request.) (Agent: If yes, when and what did the pharmacy advise?) said to call us   This is the patient's preferred pharmacy:  [id number] [address], [place] - [address] AT [facility] [address] [place] [address] [place] [address] Phone: [phone] Fax: [phone]  Is this the correct pharmacy for this prescription? Yes If no, delete pharmacy and type the correct one.   Has the prescription been filled recently? No  Is the patient out of the medication? Yes  Has the patient been seen for an appointment in the last year OR does the patient have an upcoming appointment? Yes  Can we respond through MyChart? Yes  Agent: Please be advised that Rx refills may take up to 3 business days. We ask that you follow-up with your pharmacy.

## 2024-10-30 ENCOUNTER — Ambulatory Visit: Admitting: Internal Medicine

## 2024-10-31 NOTE — Telephone Encounter (Signed)
 Pt has been notified.

## 2025-01-23 ENCOUNTER — Encounter: Admitting: Internal Medicine

## 2025-04-29 ENCOUNTER — Ambulatory Visit
